# Patient Record
Sex: Female | Born: 1940 | Race: White | Hispanic: No | Smoking: Never smoker
Health system: Southern US, Community
[De-identification: ages and names within clinical notes are randomized; demographics above are authoritative.]

## PROBLEM LIST (undated history)

## (undated) DIAGNOSIS — H269 Unspecified cataract: Secondary | ICD-10-CM

## (undated) DIAGNOSIS — M2550 Pain in unspecified joint: Secondary | ICD-10-CM

## (undated) DIAGNOSIS — Z8709 Personal history of other diseases of the respiratory system: Secondary | ICD-10-CM

## (undated) DIAGNOSIS — E039 Hypothyroidism, unspecified: Secondary | ICD-10-CM

## (undated) DIAGNOSIS — M1990 Unspecified osteoarthritis, unspecified site: Secondary | ICD-10-CM

## (undated) DIAGNOSIS — E079 Disorder of thyroid, unspecified: Secondary | ICD-10-CM

## (undated) DIAGNOSIS — M2540 Effusion, unspecified joint: Secondary | ICD-10-CM

## (undated) DIAGNOSIS — R112 Nausea with vomiting, unspecified: Secondary | ICD-10-CM

## (undated) DIAGNOSIS — M5430 Sciatica, unspecified side: Secondary | ICD-10-CM

## (undated) DIAGNOSIS — E7800 Pure hypercholesterolemia, unspecified: Secondary | ICD-10-CM

## (undated) DIAGNOSIS — R238 Other skin changes: Secondary | ICD-10-CM

## (undated) DIAGNOSIS — R233 Spontaneous ecchymoses: Secondary | ICD-10-CM

## (undated) DIAGNOSIS — Z9889 Other specified postprocedural states: Secondary | ICD-10-CM

## (undated) HISTORY — DX: Disorder of thyroid, unspecified: E07.9

## (undated) HISTORY — PX: COLONOSCOPY: SHX174

## (undated) HISTORY — DX: Pure hypercholesterolemia, unspecified: E78.00

## (undated) HISTORY — PX: THYROIDECTOMY: SHX17

## (undated) HISTORY — PX: HAND SURGERY: SHX662

## (undated) HISTORY — PX: ABDOMINAL HYSTERECTOMY: SHX81

## (undated) HISTORY — PX: VESICOVAGINAL FISTULA CLOSURE W/ TAH: SUR271

---

## 2006-02-26 ENCOUNTER — Emergency Department (HOSPITAL_COMMUNITY): Admission: EM | Admit: 2006-02-26 | Discharge: 2006-02-26 | Payer: Self-pay | Admitting: Emergency Medicine

## 2006-08-23 ENCOUNTER — Ambulatory Visit (HOSPITAL_COMMUNITY): Admission: RE | Admit: 2006-08-23 | Discharge: 2006-08-23 | Payer: Self-pay | Admitting: Family Medicine

## 2006-09-03 ENCOUNTER — Ambulatory Visit: Payer: Self-pay | Admitting: Cardiology

## 2006-09-07 ENCOUNTER — Ambulatory Visit: Payer: Self-pay | Admitting: Cardiology

## 2006-09-07 ENCOUNTER — Encounter (HOSPITAL_COMMUNITY): Admission: RE | Admit: 2006-09-07 | Discharge: 2006-10-07 | Payer: Self-pay | Admitting: Cardiology

## 2006-09-10 ENCOUNTER — Encounter: Admission: RE | Admit: 2006-09-10 | Discharge: 2006-09-10 | Payer: Self-pay | Admitting: Family Medicine

## 2006-09-13 ENCOUNTER — Ambulatory Visit: Payer: Self-pay | Admitting: Cardiology

## 2006-09-28 ENCOUNTER — Ambulatory Visit (HOSPITAL_COMMUNITY): Admission: RE | Admit: 2006-09-28 | Discharge: 2006-09-28 | Payer: Self-pay | Admitting: General Surgery

## 2007-04-24 ENCOUNTER — Observation Stay (HOSPITAL_COMMUNITY): Admission: EM | Admit: 2007-04-24 | Discharge: 2007-04-26 | Payer: Self-pay | Admitting: Emergency Medicine

## 2007-04-25 ENCOUNTER — Ambulatory Visit: Payer: Self-pay | Admitting: Internal Medicine

## 2007-10-28 ENCOUNTER — Ambulatory Visit (HOSPITAL_COMMUNITY): Admission: RE | Admit: 2007-10-28 | Discharge: 2007-10-28 | Payer: Self-pay | Admitting: Family Medicine

## 2008-02-18 ENCOUNTER — Emergency Department (HOSPITAL_COMMUNITY): Admission: EM | Admit: 2008-02-18 | Discharge: 2008-02-18 | Payer: Self-pay | Admitting: Emergency Medicine

## 2010-03-03 ENCOUNTER — Emergency Department (HOSPITAL_COMMUNITY): Admission: EM | Admit: 2010-03-03 | Discharge: 2010-03-03 | Payer: Self-pay | Admitting: Emergency Medicine

## 2010-03-06 ENCOUNTER — Ambulatory Visit: Payer: Self-pay | Admitting: Orthopedic Surgery

## 2010-03-06 DIAGNOSIS — S82009A Unspecified fracture of unspecified patella, initial encounter for closed fracture: Secondary | ICD-10-CM

## 2010-03-06 HISTORY — DX: Unspecified fracture of unspecified patella, initial encounter for closed fracture: S82.009A

## 2010-03-11 ENCOUNTER — Encounter: Payer: Self-pay | Admitting: Orthopedic Surgery

## 2010-03-12 ENCOUNTER — Encounter: Payer: Self-pay | Admitting: Orthopedic Surgery

## 2010-03-13 ENCOUNTER — Encounter: Payer: Self-pay | Admitting: Orthopedic Surgery

## 2010-03-17 ENCOUNTER — Encounter: Payer: Self-pay | Admitting: Orthopedic Surgery

## 2010-03-17 ENCOUNTER — Telehealth: Payer: Self-pay | Admitting: Orthopedic Surgery

## 2010-03-27 ENCOUNTER — Ambulatory Visit: Payer: Self-pay | Admitting: Orthopedic Surgery

## 2010-03-27 ENCOUNTER — Encounter (INDEPENDENT_AMBULATORY_CARE_PROVIDER_SITE_OTHER): Payer: Self-pay | Admitting: *Deleted

## 2010-03-28 ENCOUNTER — Encounter: Payer: Self-pay | Admitting: Orthopedic Surgery

## 2010-04-17 ENCOUNTER — Encounter (INDEPENDENT_AMBULATORY_CARE_PROVIDER_SITE_OTHER): Payer: Self-pay | Admitting: *Deleted

## 2010-04-17 ENCOUNTER — Ambulatory Visit: Payer: Self-pay | Admitting: Orthopedic Surgery

## 2010-04-22 ENCOUNTER — Telehealth: Payer: Self-pay | Admitting: Orthopedic Surgery

## 2010-04-22 ENCOUNTER — Encounter: Payer: Self-pay | Admitting: Orthopedic Surgery

## 2010-05-06 ENCOUNTER — Encounter: Payer: Self-pay | Admitting: Orthopedic Surgery

## 2010-05-15 ENCOUNTER — Ambulatory Visit: Payer: Self-pay | Admitting: Orthopedic Surgery

## 2010-05-15 ENCOUNTER — Telehealth: Payer: Self-pay | Admitting: Orthopedic Surgery

## 2010-05-19 ENCOUNTER — Encounter: Payer: Self-pay | Admitting: Orthopedic Surgery

## 2010-05-20 ENCOUNTER — Encounter: Payer: Self-pay | Admitting: Orthopedic Surgery

## 2010-05-21 ENCOUNTER — Encounter: Payer: Self-pay | Admitting: Orthopedic Surgery

## 2010-05-28 ENCOUNTER — Encounter: Payer: Self-pay | Admitting: Orthopedic Surgery

## 2010-06-04 ENCOUNTER — Encounter: Payer: Self-pay | Admitting: Orthopedic Surgery

## 2010-06-12 ENCOUNTER — Encounter: Payer: Self-pay | Admitting: Orthopedic Surgery

## 2010-07-11 ENCOUNTER — Encounter: Payer: Self-pay | Admitting: Orthopedic Surgery

## 2010-07-15 ENCOUNTER — Ambulatory Visit
Admission: RE | Admit: 2010-07-15 | Discharge: 2010-07-15 | Payer: Self-pay | Source: Home / Self Care | Attending: Orthopedic Surgery | Admitting: Orthopedic Surgery

## 2010-07-15 ENCOUNTER — Encounter: Payer: Self-pay | Admitting: Orthopedic Surgery

## 2010-07-22 ENCOUNTER — Encounter: Payer: Self-pay | Admitting: Orthopedic Surgery

## 2010-07-23 ENCOUNTER — Encounter: Payer: Self-pay | Admitting: Orthopedic Surgery

## 2010-07-28 ENCOUNTER — Encounter: Payer: Self-pay | Admitting: Orthopedic Surgery

## 2010-07-29 NOTE — Progress Notes (Signed)
Summary: call from [facility] nurse case mgr,asked light duty?  Phone Note Other Incoming   Caller: nurse case Product manager of Call: Stann Ore, W.Comp nurse case manager assigned, insurer [employer].  [place] [phone] / Direct Fax [phone] (1) Relates that employer, [employer] has called, states they have light duty work available, complete sedentary job; asking if Dr Romeo Apple would want to release for light duty. (Dx Fx patella) (2) Asked for order for shower chair, as notes patient having difficulty in & out of tub for shower Initial call taken by: Cammie Sickle,  March 17, 2010 12:12 PM  Follow-up for Phone Call        1. no   2. shower chair - yes   get script from me Follow-up by: Fuller Canada MD,  March 17, 2010 12:29 PM  Additional Follow-up for Phone Call Additional follow up Details #1::        Advised nurse case manager as per above.   Rx faxed along w/copy of notes as per request. She will fwd the shower chair Rx to the Work comp vendor. Additional Follow-up by: Cammie Sickle,  March 17, 2010 3:41 PM

## 2010-07-29 NOTE — Progress Notes (Signed)
Summary: pt order to hand & rehab  Phone Note Other Incoming   Summary of Call: [facility] Noe at [facility] requested PT order for [facility]  Her work comp carrier has scheduled her for 04/23/10 there. Order was faxed 04/22/10 Initial call taken by: Jacklynn Ganong,  April 22, 2010 2:26 PM

## 2010-07-29 NOTE — Letter (Signed)
Summary: Out of Work  [facility]  [address]  [place], [place] [address]   Phone: [phone]  Fax: [phone]    March 27, 2010   Employee:  Donna Daniels [facility]    To Whom It May Concern:  Patient/Employee was seen in our office today for an appointment.    For Medical reasons, please excuse the above named employee from work for the following dates:  Start:   03/03/10              Continue out of work status 6 to 8 weeks  End/   Approximate:  04/28/10   Next scheduled appointment:  04/17/10 at 10:30am   If you need additional information, please feel free to contact our office.           Sincerely,    Terrance Mass, MD

## 2010-07-29 NOTE — Letter (Signed)
Summary: Out of Work  [facility]  [address]  [place], [place] [address]   Phone: [phone]  Fax: [phone]    March 06, 2010   Employee:  HANH KERTESZ [facility]    To Whom It May Concern:  Patient/Employee was seen in our office today for an appointment.    For Medical reasons, please excuse the above named employee from work for the following dates:  Start:   03/03/10              Continue out of work status 6 to 8 weeks  End/   Approximate:  04/28/10  If you need additional information, please feel free to contact our office.         Sincerely,    Terrance Mass, MD

## 2010-07-29 NOTE — Miscellaneous (Signed)
Summary: Updated PT order [facility] appr'd [employer]  Updated PT order [facility] appr'd [employer]   Imported By: Cammie Sickle 04/22/2010 18:50:19  _____________________________________________________________________  External Attachment:    Type:   Image     Comment:   External Document

## 2010-07-29 NOTE — Assessment & Plan Note (Signed)
Summary: 3 WK RE-CK/XRAYS RT KNEE/W.COMP,DOI 03/03/10/CAF   Visit Type:  Follow-up/workers comp Referring Provider:  self Primary Provider:  Dr. Sherwood Gambler  CC:  fu right knee.  History of Present Illness: I saw Donna Daniels in the office today for a 3 week  followup visit.  She is a 70 years old woman with the complaint of:  right knee.  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  Xrays [facility] 03/03/10 right knee.  Meds: Synthroid, Simvastatin, Calcium, Multivitamin, Norco 5, no refills needed.  Today is 3 week recheck with xrays after using  post op brace and crutches.  doing well with minimal pain at this time  She has about 80 of knee flexion  X-ray AP lateral RIGHT knee transverse fracture patellar nondisplaced healing well  Recommend allowing the brace to drop lock to 0-70 and locked in extension for weightbearing and start physical therapy active assisted range of motion and quadriceps strengthening advanced brace as tolerated     Allergies: No Known Drug Allergies   Impression & Recommendations:  Problem # 1:  CLOSED FRACTURE OF PATELLA (ICD-822.0)  Orders: Physical Therapy Referral (PT) Post-Op Check (10932) Knee x-ray, 1/2 views (35573) out of work 4 weeks  Patient Instructions: 1)  PT:  RT KNEE AAROM AND QUAD STRENGTHENING  2)   4 WEEKS RE CHECK    Orders Added: 1)  Physical Therapy Referral [PT] 2)  Post-Op Check [99024] 3)  Knee x-ray, 1/2 views [73560]

## 2010-07-29 NOTE — Letter (Signed)
Summary: Office notes,PT order fax W[employer] ins  Office notes,PT order fax [employer] ins   Imported By: Cammie Sickle 04/24/2010 09:13:09  _____________________________________________________________________  External Attachment:    Type:   Image     Comment:   External Document

## 2010-07-29 NOTE — Medication Information (Signed)
Summary: RX Folder  RX Folder   Imported By: Cammie Sickle 03/18/2010 11:33:20  _____________________________________________________________________  External Attachment:    Type:   Image     Comment:   External Document

## 2010-07-29 NOTE — Letter (Signed)
Summary: Medical records+order fax nurse case mgr  Medical records+order fax nurse case mgr   Imported By: Cammie Sickle 03/18/2010 12:40:47  _____________________________________________________________________  External Attachment:    Type:   Image     Comment:   External Document

## 2010-07-29 NOTE — Miscellaneous (Signed)
Summary: PT initial evaluation  PT initial evaluation   Imported By: Jacklynn Ganong 05/06/2010 14:17:37  _____________________________________________________________________  External Attachment:    Type:   Image     Comment:   External Document

## 2010-07-29 NOTE — Miscellaneous (Signed)
Summary: PT Progress report  PT Progress report   Imported By: Jacklynn Ganong 05/16/2010 09:48:59  _____________________________________________________________________  External Attachment:    Type:   Image     Comment:   External Document

## 2010-07-29 NOTE — Letter (Signed)
Summary: History form  History form   Imported By: Jacklynn Ganong 03/12/2010 16:57:25  _____________________________________________________________________  External Attachment:    Type:   Image     Comment:   External Document

## 2010-07-29 NOTE — Miscellaneous (Signed)
Summary: PT Progress notes  PT Progress notes   Imported By: Jacklynn Ganong 05/29/2010 08:46:21  _____________________________________________________________________  External Attachment:    Type:   Image     Comment:   External Document

## 2010-07-29 NOTE — Letter (Signed)
Summary: Out of Work  [facility]  [address]  [place], [place] [address]   Phone: [phone]  Fax: [phone]    April 17, 2010   Employee:  [name] [facility]    To Whom It May Concern:   For Medical reasons, please excuse the above named employee from work for the following dates:  Start:   03/03/10              Continue out of work status  End/ Approximate:     05/28/10  If you need additional information, please feel free to contact our office.         Sincerely,    Terrance Mass, MD

## 2010-07-29 NOTE — Letter (Signed)
Summary: Out of Work  [facility]  [address]  [place], [place] [address]   Phone: [phone]  Fax: [phone]    May 15, 2010   Employee:  MIRCA YALE [employer]    To Whom It May Concern:   For Medical reasons, please continue out of work status regarding the above named employee from work through the following date:  (End):  May 27, 2010  Cleared to return to work, sitting only:  May 28, 2010    If you need additional information, please feel free to contact our office.         Sincerely,    Terrance Mass, MD

## 2010-07-29 NOTE — Progress Notes (Signed)
Summary: call from case manager about today's appointment  Phone Note Other Incoming   Caller: Workers comp Stage manager of Call: Stann Ore RN, case manager for patient called to  (1) verify patient's appointment for this am (10:30) (2) call attention to the fax sent for Dr Romeo Apple to address at this visit       - "very sedentary" job description to review, if patient would be medically cleared and able to perform (3) reminder that this employer runs FMLA along with Workers Comp cases, and patient's FMLA runs out 05/27/10, at which time her regular/original job is no longer protected. Initial call taken by: Cammie Sickle,  May 15, 2010 8:50 AM

## 2010-07-29 NOTE — Letter (Signed)
Summary: Fax medical records + job req Work comp nurse case mgr  Fax medical records + job req Work comp nurse case mgr   Imported By: Cammie Sickle 05/19/2010 17:25:36  _____________________________________________________________________  External Attachment:    Type:   Image     Comment:   External Document

## 2010-07-29 NOTE — Miscellaneous (Signed)
Summary: PT progress notes  PT progress notes   Imported By: Jacklynn Ganong 06/06/2010 08:55:39  _____________________________________________________________________  External Attachment:    Type:   Image     Comment:   External Document

## 2010-07-29 NOTE — Miscellaneous (Signed)
Summary: PT progress note  PT progress note   Imported By: Jacklynn Ganong 05/21/2010 09:03:55  _____________________________________________________________________  External Attachment:    Type:   Image     Comment:   External Document

## 2010-07-29 NOTE — Miscellaneous (Signed)
Summary: PT progress note  PT progress note   Imported By: Jacklynn Ganong 05/26/2010 13:17:16  _____________________________________________________________________  External Attachment:    Type:   Image     Comment:   External Document

## 2010-07-29 NOTE — Letter (Signed)
Summary: Med records fax [place] [employer] Work comp   Med records fax [place] [employer] Work comp   Imported By: Cammie Sickle 03/28/2010 21:56:46  _____________________________________________________________________  External Attachment:    Type:   Image     Comment:   External Document

## 2010-07-29 NOTE — Letter (Signed)
Summary: [place]  History form   Imported By: Jacklynn Ganong 03/12/2010 16:52:55  _____________________________________________________________________  External Attachment:    Type:   Image     Comment:   External Document

## 2010-07-29 NOTE — Assessment & Plan Note (Signed)
Summary: 3 WK RE-CK/XRAYS RT KNEE/W.COMP,DOI 03/03/10/CAF   Visit Type:  Follow-up Referring Provider:  self Primary Provider:  Dr. Sherwood Gambler  CC:  right knee pain.  History of Present Illness: I saw Donna Daniels in the office today for a 3 week  followup visit.  She is a 70 years old woman with the complaint of:  righ tknee.  Xrays today.  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  Xrays [place] 03/03/10 right knee.  Meds: Synthroid, Simvastatin, Calcium, Multivitamin, Norco 5.  The patient reports that she is doing well.  She has mild discomfort across the front of the knee.  An x-ray was obtained today 2 views of the RIGHT knee AP and lateral there is a transverse fracture best seen on the lateral x-ray which is nondisplaced  Recommendation continue weightbearing in the brace with protection from the crutches/walker followup in 3 weeks for repeat x-ray if x-rays show no change in position of the fracture then we can start some rehabilitation  The patient is to continue work status   Allergies: No Known Drug Allergies   Impression & Recommendations:  Problem # 1:  CLOSED FRACTURE OF PATELLA (ICD-822.0) Assessment Improved  Orders: Post-Op Check ([address]) Patella Fx (60454)  Patient Instructions: 1)  CONTINUE BRACE X 3 WEEKS  2)  REPEAT XRAYS THEN

## 2010-07-29 NOTE — Letter (Signed)
Summary: Work note fax to Work [employer] ins  Work note fax to Work Comp [employer]   Imported By: Cammie Sickle 03/11/2010 17:45:27  _____________________________________________________________________  External Attachment:    Type:   Image     Comment:   External Document

## 2010-07-29 NOTE — Letter (Signed)
Summary: [place] [employer] W Comp job description  [place] [employer] W Comp job description   Imported By: Cammie Sickle 05/20/2010 14:37:33  _____________________________________________________________________  External Attachment:    Type:   Image     Comment:   External Document

## 2010-07-29 NOTE — Letter (Signed)
Summary: Medical record request [facility]  Medical record request [employer] W[employer]Comp   Imported By: Cammie Sickle 03/17/2010 18:03:30  _____________________________________________________________________  External Attachment:    Type:   Image     Comment:   External Document

## 2010-07-29 NOTE — Assessment & Plan Note (Signed)
Summary: 4 WK RE-CK RT KNEE/W.COMP/DOI/03/03/10/CAF   Visit Type:  Follow-up Referring Provider:  self Primary Provider:  Dr. Sherwood Gambler  CC:  recheck knee.  History of Present Illness: I saw Donna Daniels in the office today for a 3 week  followup visit.  She is a 70 years old woman with the complaint of:  right knee.  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  Meds: Synthroid, Simvastatin, Calcium, Multivitamin, Norco 5 takes as needed.  Today is 4 week recheck after PT, report for review to be signed from 05/14/10, [facility].  Wants to know if she can drive.  PT is doing well, still some weakness.    Allergies: No Known Drug Allergies  Physical Exam  Additional Exam:  RIGHT knee flexion is 120, full extension. Has good extension. She can move her leg well. No tenderness.  No deformity. Neurovascular exam is intact. Skin is intact; knee stable   Impression & Recommendations:  Problem # 1:  CLOSED FRACTURE OF PATELLA (ICD-822.0) Assessment Improved  I reviewed her work description and the job descriptions available.  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 Return to regular job duties  Orders: Post-Op Check [phone])  Patient Instructions: 1)  Wear new brace x 4 weeks 2)  Therapy x 2 weeks  3)  return to work Nov 30th  4)  8 weeks   Orders Added: 1)  Post-Op Check [36144]

## 2010-07-29 NOTE — Assessment & Plan Note (Signed)
Summary: 1:30PM/EVAL/TREAT FX RT KNEECAP/XRAY [place]/W.COMP DOI 9/5...   Vital Signs:  Patient profile:   70 year old female Height:      67 inches Weight:      151 pounds Pulse rate:   76 / minute Resp:     16 per minute  Vitals Entered By: Fuller Canada MD (March 06, 2010 1:29 PM)  Visit Type:  new patient Referring Provider:  self Primary Provider:  Dr. Sherwood Gambler  CC:  right knee pain.  History of Present Illness: I saw Donna Daniels in the office today for an initial evaluation.  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  Xrays [place] 03/03/10 right knee.  Meds: Synthroid, Simvastatin, Calcium, Multivitamin, Norco 5 from er.  ER treated with knee immobilizer.  She was injured on September 5.  She fell at work while pulling on a table which is part of her daily duties.  She landed on her RIGHT knee complains of sharp throbbing burning pain, 8/10 severity, pain is better when she is not moving but any attempts at weight bearing pain increases  She has some numbness and tingling some swelling and bruising.  She is in a brace which she got from the ER she is on crutches she is laboring to walk.  She is also on some Advil at a little bit of a fever which goes away with Tylenol.    Allergies (verified): No Known Drug Allergies  Past History:  Past Medical History: htn high cholesterol thyroid  Past Surgical History: c section hysterectomy thyroidectomy hand surgery  Family History: Family History Coronary Heart Disease female < 38 Family History of Arthritis Hx, family, asthma  Social History: Patient is widowed.  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 Cardiovascular:  Denies chest pain, palpitations, fainting, and murmurs. Respiratory:  Complains of snoring; denies short of breath, wheezing, couch, tightness, pain on inspiration, and snoring . Gastrointestinal:  Complains of nausea; denies heartburn, vomiting, diarrhea, constipation, and blood in your stools. Genitourinary:  Denies frequency, urgency, difficulty urinating, painful urination, flank pain, and bleeding in urine. Neurologic:  Denies numbness, tingling, unsteady gait, dizziness, tremors, and seizure. Musculoskeletal:  Complains of joint pain, swelling, instability, stiffness, and heat; denies redness and muscle pain. Endocrine:  Denies excessive thirst, exessive urination, and heat or cold intolerance. Psychiatric:  Denies nervousness, depression, anxiety, and hallucinations. Skin:  Complains of itching; denies changes in the skin, poor healing, rash, and redness. HEENT:  Denies blurred or double vision, eye pain, redness, and watering. Immunology:  Denies seasonal allergies, sinus problems, and allergic to bee stings. Hemoatologic:  Complains of brusing; denies easy bleeding.  Physical Exam  Additional Exam:  GEN: well developed, well nourished, normal grooming and hygiene, no deformity and normal body habitus.   CDV: lower extremities/pulses are normal, no edema, no erythema. no tenderness  Lymph: normal lymph nodes   Skin: lower extremities/no rashes, skin lesions or open sores   NEURO:lower extremities/ normal coordination, reflexes, sensation.   Psyche: awake, alert and oriented. Mood normal   Gait: ambulating with crutches  RIGHT knee:Tenderness at  the inferior pole the patella.  Range of motion limited 20.  Straight leg raise intact.  Knee stable.  LEFT knee: Tenderness in the patella but no swelling, normal range of motion, normal strength, no  instability.    Impression & Recommendations:  Problem # 1:  CLOSED FRACTURE OF PATELLA (ICD-822.0) Assessment New  x-rays from the hospital multiple views of the RIGHT knee show a nondisplaced patella fracture  Impression nondisplaced patella fracture  The patient can perform straight leg raise without extensor lag so no surgery should be needed as long as the fracture does not displace  Recommend straight leg brace weightbearing as tolerated for 3 weeks then range of motion brace advance range of motion as tolerated probably take 6-8 weeks for healing and recovery period  Orders: New Patient Level III ([address])  Medications Added to Medication List This Visit: 1)  Norco 5-325 Mg Tabs (Hydrocodone-acetaminophen) .Marland Kitchen.. 1 q 4 as needed pain  Patient Instructions: 1)  brace in extension 3 weeks  2)  then 3 weeks ROM  3)  OOW 6-8 WEEKS  4)  F/U IN 3 WEEKS XRAYS [ADV ROM] Prescriptions: NORCO 5-325 MG TABS (HYDROCODONE-ACETAMINOPHEN) 1 Q 4 as needed PAIN  #40 x 1   Entered and Authorized by:   Fuller Canada MD   Signed by:   Fuller Canada MD on 03/06/2010   Method used:   Print then Give to Patient   RxID:   [id number]

## 2010-07-31 ENCOUNTER — Encounter: Payer: Self-pay | Admitting: Orthopedic Surgery

## 2010-07-31 ENCOUNTER — Telehealth: Payer: Self-pay | Admitting: Orthopedic Surgery

## 2010-07-31 NOTE — Letter (Signed)
Summary: Medical record request [facility]  Medical record request [facility]   Imported By: Cammie Sickle 07/11/2010 19:31:00  _____________________________________________________________________  External Attachment:    Type:   Image     Comment:   External Document

## 2010-07-31 NOTE — Assessment & Plan Note (Signed)
Summary: 8 WK RE-CK RT KNEE/W.COMP-RELATED INJURY 03/03/10/CAF   Visit Type:  Follow-up Referring Provider:  self Primary Provider:  Dr. Sherwood Gambler  CC:  right knee pain.  History of Present Illness: I saw Donna Daniels in the office today for a 8 week  followup visit.  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  Treated with immobilization and therapy   Meds: Synthroid, Simvastatin, Calcium, Multivitamin, Norco 5 takes as needed, Advil 400mg  as needed.  Complaints: She has been having shooting and throbbing pain for the past week. ? if the weather has anything to do with it.  She was terminated from her job so she's not doing any work right now  Complaints of pain and swelling RIGHT knee primarily around the joint     Allergies: No Known Drug Allergies  Physical Exam  Skin:  intact without lesions or rashes Psych:  alert and cooperative; normal mood and affect; normal attention span and concentration   Knee Exam  General:    Well-developed, well-nourished, normal body habitus; no deformities, normal grooming.  Gait:    Normal heel-toe gait pattern bilaterally.    Skin:    Intact, no scars, lesions, rashes, cafe au lait spots, or bruising.    Inspection:    RIGHT knee joint mild effusionswelling:   Palpation:    she's tender over the medial joint line as well as the distal pole of the patella and also over the has anserine bursa  Vascular:    There was no swelling or varicose veins. The pulses and temperature are normal. There was no edema or tenderness.  Sensory:    Gross coordination and sensation were normal.    Motor:    Motor strength 5/5 bilaterally for quadriceps, hamstrings, ankle dorsiflexion, and ankle plantar flexion.    Knee Exam:    Right:  Inspection:  Abnormal    Palpation:  Abnormal    Stability:  stable    range of motion is flexion 125 with full extension   Impression & Recommendations:  Problem # 1:  CLOSED FRACTURE OF PATELLA (ICD-822.0) Assessment Comment Only  there is an effusion there is some peripatellar tenderness and medial joint line tenderness although the knee remained stable.  Whether related pain may be the cause.  I see no structural damage to the knee  Recommend increased Advil 3 times a day.  Wear brace as tolerated  I expect a part permanent partial impairment of approximately 7-1/2% based on the continued pain and the fracture.  Orders: Est. Patient Level III (81191)  Patient Instructions: 1)  Take 400mg  advil three times a day and pain med as needed. 2)  Return in march 3)  Brace off wear as tolerated  4)  expect PPI  Prescriptions: NORCO 5-325 MG TABS (HYDROCODONE-ACETAMINOPHEN) 1 Q 4 as needed PAIN  #60 x 0   Entered and Authorized by:   Fuller Canada MD   Signed by:   Fuller Canada MD on 07/15/2010   Method used:   Print then Give to Patient   RxID:   [id number]    Orders Added: 1)  Est. Patient Level III [46962]

## 2010-07-31 NOTE — Miscellaneous (Signed)
Summary: Rehab discharge summary  Rehab discharge summary   Imported By: Jacklynn Ganong 06/16/2010 14:20:21  _____________________________________________________________________  External Attachment:    Type:   Image     Comment:   External Document

## 2010-07-31 NOTE — Letter (Signed)
Summary: Work [facility]  [address]  [place], [place] [address]   Phone: [phone]  Fax: [phone]    Today's Date: July 22, 2010  Name of Patient: Donna Daniels  The above named patient had a medical visit on  07/15/10.  Please take this into consideration when reviewing the time away from work  Special Instructions:  Please continue same work status:  (End Date):  May 27, 2010  Cleared to return to work, sitting only   May 28, 2010 ____________________________________________   Sincerely yours,   Terrance Mass, MD

## 2010-07-31 NOTE — Letter (Signed)
Summary: Medical records fax to Workers Comp case mgr  Medical records fax to Workers Comp case mgr   Imported By: Cammie Sickle 07/15/2010 18:26:49  _____________________________________________________________________  External Attachment:    Type:   Image     Comment:   External Document

## 2010-07-31 NOTE — Letter (Signed)
Summary: Work note fax to Workers Comp case mgr  Work note fax to Workers Comp case mgr   Imported By: Cammie Sickle 07/25/2010 18:01:08  _____________________________________________________________________  External Attachment:    Type:   Image     Comment:   External Document

## 2010-08-06 NOTE — Letter (Signed)
Summary: medical record fax [facility] off notes 07/15/10  medical record fax [facility] off notes 07/15/10   Imported By: Cammie Sickle 08/01/2010 09:39:17  _____________________________________________________________________  External Attachment:    Type:   Image     Comment:   External Document

## 2010-08-06 NOTE — Progress Notes (Signed)
Summary: call from [facility] per request  Phone Note Call from Patient   Summary of Call: Silvana Newness, paralegal from [facility] called, ph [phone], fax [phone], states that patient relayed that Dr Romeo Apple needed to speak with their office.  Please call at above # if so. Initial call taken by: Cammie Sickle,  July 31, 2010 12:34 PM

## 2010-08-14 ENCOUNTER — Encounter: Payer: Self-pay | Admitting: Orthopedic Surgery

## 2010-08-20 NOTE — Letter (Signed)
Summary: medical records  2nd request [facility]  medical records  2nd request [facility]   Imported By: Cammie Sickle 08/13/2010 20:18:52  _____________________________________________________________________  External Attachment:    Type:   Image     Comment:   External Document

## 2010-08-20 NOTE — Miscellaneous (Signed)
Summary: DR H , call to [employer]  asked if they needed any more info from our office?  just progress notes   spoke to [facility]

## 2010-09-09 ENCOUNTER — Encounter: Payer: Self-pay | Admitting: Orthopedic Surgery

## 2010-09-09 ENCOUNTER — Ambulatory Visit (INDEPENDENT_AMBULATORY_CARE_PROVIDER_SITE_OTHER): Payer: Worker's Compensation | Admitting: Orthopedic Surgery

## 2010-09-09 DIAGNOSIS — S82009A Unspecified fracture of unspecified patella, initial encounter for closed fracture: Secondary | ICD-10-CM

## 2010-09-10 ENCOUNTER — Encounter: Payer: Self-pay | Admitting: Orthopedic Surgery

## 2010-09-15 ENCOUNTER — Encounter: Payer: Self-pay | Admitting: Orthopedic Surgery

## 2010-09-16 NOTE — Assessment & Plan Note (Signed)
Summary: reck rt knee/doi 03/03/10/w comp/caf   Visit Type:  Follow-up workers comp Referring Provider:  self Primary Provider:  Dr. Sherwood Gambler  CC:  right knee recheck.  History of Present Illness: I saw Donna Daniels in the office today for a 8 week  followup visit.  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  treatment immobilization and therapy  Meds: Synthroid, Simvastatin, Calcium, Multivitamin, Norco 5 takes as needed, Advil 400mg  as needed.  Today is 2 month recheck after wearing brace as needed.  She says that she is doing much better she has pain level I/X.  She uses her brace intermittently.  She takes Advil when needed.  She wishes she could return to work.       Allergies: No Known Drug Allergies  Review of Systems Musculoskeletal:  denies catching locking or giving way..  Physical Exam  Additional Exam:   Her examination reveals a normal gait pattern.  She has 132 of knee flexion with full extension.  She has a straight leg raise which is intact with no extensor lag.  Her knee is stable.  There is no swelling or tenderness.  She has normal sensation and normal pulses in the RIGHT lower extremity.   Impression & Recommendations:  Problem # 1:  CLOSED FRACTURE OF PATELLA (ICD-822.0) Assessment Improved  Orders: Est. Patient Level III (30865)  Patient Instructions: 1)  Brace as needed  2)  f/u as needed    Orders Added: 1)  Est. Patient Level III [78469]

## 2010-09-25 NOTE — Letter (Signed)
Summary: Medical records request office notes [facility]  Medical records request office notes [facility]   Imported By: Cammie Sickle 09/14/2010 11:40:36  _____________________________________________________________________  External Attachment:    Type:   Image     Comment:   External Document

## 2010-09-25 NOTE — Letter (Signed)
Summary: Medical office notes fax Workers Chi[employer] office notes fax Workers Comp insurer   Imported By: Cammie Sickle 09/15/2010 20:16:57  _____________________________________________________________________  External Attachment:    Type:   Image     Comment:   External Document

## 2010-09-29 ENCOUNTER — Other Ambulatory Visit: Payer: Self-pay | Admitting: Family Medicine

## 2010-09-29 DIAGNOSIS — Z1231 Encounter for screening mammogram for malignant neoplasm of breast: Secondary | ICD-10-CM

## 2010-10-16 ENCOUNTER — Encounter: Payer: Self-pay | Admitting: Orthopedic Surgery

## 2010-10-22 ENCOUNTER — Ambulatory Visit
Admission: RE | Admit: 2010-10-22 | Discharge: 2010-10-22 | Disposition: A | Discharge status: Home / Self Care | Payer: Medicare Other | Source: Ambulatory Visit | Attending: Family Medicine | Admitting: Family Medicine

## 2010-10-22 DIAGNOSIS — Z1231 Encounter for screening mammogram for malignant neoplasm of breast: Secondary | ICD-10-CM

## 2010-11-11 NOTE — Consult Note (Signed)
Donna Daniels, [name]              ACCOUNT NO.:  [id number]   MEDICAL RECORD NO.:  [id number]          PATIENT TYPE:  OBV   LOCATION:  A207                          FACILITY:  [facility]   PHYSICIAN:  Kofi A. Gerilyn Pilgrim, M.D. DATE OF BIRTH:  Jun 02, 1941   DATE OF CONSULTATION:  DATE OF DISCHARGE:                                 CONSULTATION   This is a 70 year old white female who presents with an ill-defined  event.  She reports feeling faint and light-headed as if she was going  to pass out.  She says this happened at work.  She apparently works at  [employer].  She denies any spinning sensation.  She reports having blurry  vision in both visual fields involving both eyes, also circle-like  visual obscurations.  She denies any flashing lights.  The patient  currently has some feelings of nausea, but no emesis.  She went to the  car to relax.  She apparently was found sleeping in the car by her  daughter.  She denies any loss of consciousness or alteration of  consciousness.  There is focal numbness or weakness, no dysarthria or  dysphasia.  She has some headache in the posterior aspect.  The patient  prior did have some gait instability.   The medical history is significant for dyslipidemia and hypothyroidism.   ADMISSION MEDICATIONS:  Synthroid and Zocor.   ALLERGIES:  NONE KNOWN.   Family history is significant for a father who died of a brain aneurysm  in his 81s, but also history of stroke and heart disease.  The patient  also had other relatives with aneurysms of the brain and abdominal  aorta.   SOCIAL HISTORY:  She lives in [place].  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  There is no history of tobacco use, no history of  alcohol or illicit drug use.  She lives independently.   REVIEW OF SYSTEMS:  Unremarkable, other than stated in the history of  present illness   PHYSICAL EXAMINATION:  GENERAL:  Physical examination shows a thin,  pleasant lady in no acute  distress.  VITAL SIGNS:  Temperature 97, pulse 65, respirations 16, blood pressure  106/65.  HEENT EVALUATION:  Neck is supple. Head is normocephalic, atraumatic.  ABDOMEN:  Soft.  EXTREMITIES:  No edema.  MENTATION:  She is awake and alert.  She converses well.  Speech,  language and cognition are intact.  CRANIAL NERVES EVALUATION:  Pupils are 4 mm and reactive to light.  Extraocular movements are intact in the mesiolabial folds.  The  patient's muscle strength is symmetric.  The tongue is midline.  The  uvula is midline.  Shoulder shrug is normal.  MOTOR EXAMINATION:  Normal tone, bulk and strength.  I see no pronator  drift.  COORDINATION:  Shows no tremors, dysmetria or Parkinsonism.  REFLEXES:  Normal.  Tack reflexes are both equivocal.  SENSATION:  Normal to temperature and light tough.   Her CT scan of the brain is normal.   LABORATORY EVALUATION:  CPK is 71.  Repeat 109.  Urinalysis negative.  ESR of 12.  Hepatic function normal.  Sodium 139, potassium 3.5,  chloride 102, CO2 32, glucose 109, BUN 18, creatinine 0.7.  WBC 8,  hemoglobin 12.3, platelet count of 282.   ASSESSMENT:  Syncope/dizziness.  The semiology is nondescript and ill-  defined but certainly a chance that ischemic attack could be a  possibility.  Other possibilities include atypical seizure.   RECOMMENDATIONS:  Agree with imaging with MRI and MRA.  She is on  aspirin, which is appropriate, and I am going to add an EEG.  Further  recommendations will follow depending on the initial evaluation.   Thanks for this consultation.      Kofi A. Gerilyn Pilgrim, M.D.  Electronically Signed     KAD/MEDQ  D:  04/25/2007  T:  04/25/2007  Job:  [id number]

## 2010-11-11 NOTE — H&P (Signed)
Donna Daniels, Donna Daniels              ACCOUNT NO.:  [id number]   MEDICAL RECORD NO.:  [id number]          PATIENT TYPE:  OBV   LOCATION:  A207                          FACILITY:  [facility]   PHYSICIAN:  Osvaldo Shipper, MD     DATE OF BIRTH:  05/26/1941   DATE OF ADMISSION:  04/24/2007  DATE OF DISCHARGE:  LH                              HISTORY & PHYSICAL   PRIMARY CARE PHYSICIAN:  Patrica Duel, M.D.   ADMISSION DIAGNOSES:  1. Possible transient ischemic attack.  2. History of hypothyroidism.  3. History of dyslipidemia.   CHIEF COMPLAINT:  Blurred vision and left-sided weakness since this  afternoon.   HISTORY OF PRESENT ILLNESS:  This patient is a 70 year old Caucasian  female who has a history of dyslipidemia and hypothyroidism who was  working at [employer] when she had blurred vision.  She started noticing  circles in her vision.  She closed each of her eyes and the deficits did  not go away.  She walked to her car which was parked outside the [place]  and felt lightheaded and then she was brought into the ED.  She did not  have any syncopal episode as per the patient, but she was found sleeping  in her car.  She also started getting headaches in the back of her head  after these episodes started.  She felt nauseous, but no emesis.  No  chest pain.  She is complaining of left-sided flank pain.  She mentions  that on Friday she had pain in the left side of her neck which has since  resolved. She is having some instability with her gait as well.   MEDICATIONS:  1. Synthroid 100 mcg once a day.  2. Zocor 20 mg once a day.   ALLERGIES:  No known drug allergies.   PAST MEDICAL HISTORY:  Positive for dyslipidemia, hypothyroidism.  She  had a stress test back in March of this year which was negative for  ischemia.  She had a history of abnormal EKG.  She had a history of  thyroidectomy for goiter, history of cesarean section, history of  hysterectomy, and hand surgery for crush  injury.   SOCIAL HISTORY:  She lives in [place] and lives alone.  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  No smoking, alcohol, or illicit drug use.  She is  independent with daily activities.   FAMILY HISTORY:  Father died of brain aneurysm in his 64s.  Mother had  history of stroke and some heart disease.  One of her uncles died of  brain aneurysm as well.  One of her aunts had abdominal aneurysm.  History of coronary artery disease also present in the family.   REVIEW OF SYSTEMS:  GENERAL:  Positive for weakness.  HEENT:  Unremarkable.  Blurred vision resolved.  CARDIOVASCULAR:  Unremarkable.  RESPIRATORY:  Unremarkable.  GASTROINTESTINAL:  Unremarkable.  GENITOURINARY:  Unremarkable.  MUSCULOSKELETAL:  Positive for arthritis.  NEUROLOGY:  As in HPI.   PHYSICAL EXAMINATION:  VITAL SIGNS:  Temperature 98.5, blood pressure  124/74, heart rate 74, respiratory rate 18, saturations 99% on room air.  GENERAL:  Thin white female in no distress.  HEENT:  There is no pallor and no icterus.  Oral mucous membrane is  moist.  No oral lesions are noted.  NECK:  Soft and supple.  No thyromegaly is appreciated.  LUNGS:  Clear to auscultation bilaterally.  CARDIOVASCULAR:  S1 and S2 is normal and regular.  No S3 and no S4.  No  carotid bruits are heard.  ABDOMEN:  No abdominal bruits are heard.  Abdomen is soft, nontender,  and nondistended.  No flank tenderness is present.  No mass or  organomegaly is appreciated.  EXTREMITIES:  Without edema.  Peripheral pulses are palpable.  NEUROLOGY:  Cranial nerves II-XII grossly intact.  Sensory examination  within normal limits.  Motor examination reveals mild deficits on the  left side predominantly in the left lower extremity.  Strength is  probably between 4-5 out of 5.  Right side is completely normal.  She  did not have any pronator drift.  The cerebellar signs probably a little   weaker on the left side.  Her gait was unsteady.  Her Romberg sign was  negative.  No nystagmus was noted.   EKG shows sinus rhythm with a normal axis.  Intervals appear to be in  normal range.  No definite Q waves appreciated.  A lot of nonspecific T  wave changes with flattening and mild inversion noted.  No other  concerning ST changes are noted.   CT of head was negative for any acute process.   LABORATORY DATA:  CBC was unremarkable.  BMET was unremarkable.   Chest x-ray has not been done.   ASSESSMENT:  This is a 70 year old Caucasian female with a history of  dyslipidemia, hypothyroidism, who presents with sudden onset blurred  vision and unsteadiness of gait.  CT of head is negative.  The patient  has probably had either transient ischemic attack or stroke involving  the posterior circulation most likely.  She could possibly have migraine  headaches as well.  She could also have aneurysms in her brain.   PLAN:  1. Possible transient ischemic attack versus stroke.  We will observe      the patient in the hospital and get a MRI of her brain done      tomorrow.  We will also get an MRA done considering history of      aneurysms in her family.  We will put her on aspirin.  Her blood      pressure is very well controlled and continue the Zocor.  We will      do the stroke workup in the form of an echo and carotid Doppler.      We will also check a chest x-ray.  We will do homocysteine level,      ESR, and RPR.  We will also check her LFT's and coags.  2. Continue her Synthroid and Zocor for hypothyroidism and      dyslipidemia, respectively.  Deep venous thrombosis and      gastrointestinal prophylaxis will be initiated.  3. PT/OT consults will be done.  At this time she does not need a      swallow evaluation.  There is good gag reflex.  4. She is a full code.   The above is explained to the patient's daughter with the patient's  permission and she is agreeable.  A  neurology  consultation will also be  ordered.      Osvaldo Shipper, MD  Electronically Signed     GK/MEDQ  D:  04/24/2007  T:  04/24/2007  Job:  [id number]   cc:   Patrica Duel, M.D.  Fax: [phone]   Kofi A. Gerilyn Pilgrim, M.D.  Fax: [phone]

## 2010-11-11 NOTE — Discharge Summary (Signed)
Donna Daniels, Donna Daniels              ACCOUNT NO.:  [id number]   MEDICAL RECORD NO.:  [id number]          PATIENT TYPE:  OBV   LOCATION:  A207                          FACILITY:  [facility]   PHYSICIAN:  Marcello Moores, MD   DATE OF BIRTH:  [birth year]   DATE OF ADMISSION:  04/24/2007  DATE OF DISCHARGE:  LH                               DISCHARGE SUMMARY   PMD:  Dr. Patrica Duel.   DISCHARGE DIAGNOSES:  1. Syncope/transient ischemic attack resolved.  2. History of hypothyroidism stable.  3. Dyslipidemia stable.   HOME MEDICATIONS:  She will be discharged with her home medications  Zocor 20 mg p.o. daily and Synthroid 100 mcg p.o. daily.   HOSPITAL COURSE:  Ms. Ciolino is a 70 year old female patient with the  above medical problems.  She presented to the emergency room with sudden  onset of blurred vision while she was working at [employer], and she  presented to emergency room.  This episode was not repeated, and she was  admitted for observation.  She has not had any similar episode while she  is the hospital.  CAT scan of the brain was done during the admission  which showed no significant abnormality and MRA as well as an MRI was  done the next day and showed no significant abnormality without any sign  of stenosis or occlusion.  She was evaluated by neurologist Dr. Gerilyn Pilgrim  as well.  Carotid Doppler was also done which showed no evidence of  significant leg stenosis with normal intercarotid arteries.  The patient  remains remained stable, and she is ready to go home today.   PHYSICAL EXAMINATION:  VITAL SIGNS:  Today temperature 97, pulse 63,  respiratory rate 20, blood pressure 113/69, and she is saturating 97% on  room air.  HEENT: She has pink conjunctivae, nonicteric sclera.  NECK:  She has supple neck.  CHEST:  Good air entry bilaterally.  CVS:  S1, S2 well heard.  No murmur appreciated.  ABDOMEN:  Soft.  EXTREMITIES:  No edema.  CNS:  She is alert and well-oriented.   LABORATORY DATA:  CBC as well as chemistries were within normal range.  Cardiac enzymes three times also were within normal range.   PLAN OF DISCHARGE:  She will be discharged today to have follow-up with  her PMD in the time of one week.      Marcello Moores, MD  Electronically Signed     MT/MEDQ  D:  04/26/2007  T:  04/26/2007  Job:  [id number]   cc:   Patrica Duel, M.D.  Fax: [phone]

## 2010-11-11 NOTE — Procedures (Signed)
NAMESHAWNDRA, CLUTE NO.:  [id number]   MEDICAL RECORD NO.:  [id number]          PATIENT TYPE:  OBV   LOCATION:  A207                          FACILITY:  [facility]   PHYSICIAN:  Pricilla Riffle, MD, FACCDATE OF BIRTH:  05/01/1941   DATE OF PROCEDURE:  04/25/2007  DATE OF DISCHARGE:                                ECHOCARDIOGRAM   INDICATIONS:  A 70 year old, history of TIA, test to evaluation LV  function.   TWO-D ECHOCARDIOGRAM WITH ECHO-DOPPLER:  Left ventricle is normal in  size with an end-diastolic dimension of 45 mm.  The inner ventricular  septum and posterior wall are normal at 11 mm each.  Left atrium is  normal at 37 mm.  Right atrium appears mildly dilated.  Right ventricle  appears to be mildly dilated.   The aortic valve is mildly thickened, not stenotic, there is no  insufficiency.  Mitral valve is grossly normal with mild insufficiency  (1 out of 4).  Pulmonic valve is normal with no insufficiency.  Tricuspid valve is normal with mild insufficiency (1 out of 4).  Estimate PA pressure by TR jet is 38 mmHg.   Overall left ventricular systolic function is normal with an LVF of  approximately 55-60%.  Note there is some paradoxical septal motion at  the septal base.   RV is again mildly dilated but overall RV function appears normal.   IVC is normal.  No pericardial effusion is seen.      Pricilla Riffle, MD, [facility]  Electronically Signed     PVR/MEDQ  D:  04/25/2007  T:  04/26/2007  Job:  [id number]   cc:   Thora Lance  Fax: [phone]

## 2010-11-14 NOTE — H&P (Signed)
NAMERAYCHELL, HOLCOMB              ACCOUNT NO.:  [id number]   MEDICAL RECORD NO.:  [id number]          PATIENT TYPE:  AMB   LOCATION:  DAY                           FACILITY:  [facility]   PHYSICIAN:  Dalia Heading, M.D.  DATE OF BIRTH:  1940-10-29   DATE OF ADMISSION:  DATE OF DISCHARGE:  LH                              HISTORY & PHYSICAL   CHIEF COMPLAINT:  Need for screening colonoscopy.   HISTORY OF PRESENT ILLNESS:  The patient is a 70 year old white female  who is referred for endoscopic evaluation.  She needs a colonoscopy for  screening purposes.  No abdominal pain, weight loss, nausea, vomiting,  diarrhea, constipation, melena, hematochezia have been noted.  She has  never had a colonoscopy.  There is no family history of colon carcinoma.   PAST MEDICAL HISTORY:  Hypothyroidism.   PAST SURGICAL HISTORY:  1. C-section.  2. Thyroidectomy.  3. Hand surgery.  4. Hysterectomy.   CURRENT MEDICATIONS:  Synthroid.   ALLERGIES:  NO KNOWN DRUG ALLERGIES.   REVIEW OF SYSTEMS:  Noncontributory.   PHYSICAL EXAMINATION:  GENERAL:  The patient is a well-developed, well-  nourished white female in no acute distress.  LUNGS:  Clear to auscultation with equal breath sounds bilaterally.  HEART:  Reveals a regular rate and rhythm without S3, S4, or murmurs.  ABDOMEN:  Soft, nontender, nondistended.  No hepatosplenomegaly or  masses are noted.  RECTAL:  Deferred to the procedure.   IMPRESSION:  Need for screening colonoscopy.   PLAN:  The patient is scheduled for colonoscopy on September 28, 2006.  The  risks and benefits of the procedure including bleeding and perforation  were fully explained to the patient, gave informed consent.      Dalia Heading, M.D.  Electronically Signed     MAJ/MEDQ  D:  09/09/2006  T:  09/10/2006  Job:  [id number]   cc:   [facility]  Fax: [phone]   Kirk Ruths, M.D.  Fax: [phone]

## 2010-11-14 NOTE — Letter (Signed)
September 03, 2006    Kirk Ruths, M.D.  [address]  [place], [place] [address]   RE:  Donna Daniels, Daniels  MRN:  [id number]  /  DOB:  07/24/1940   Dear Annette Stable:   It is my pleasure evaluating Donna Daniels Daniels in the office today in  consultation at your request.  As you know, Donna Daniels Daniels has enjoyed  generally excellent health.  She has not had hypertension nor diabetes.  She does not use tobacco products.  She has had mild to moderate  hyperlipidemia that has not required pharmacologic therapy.  She was  evaluated by our group approximately 10 years ago for chest pain and  underwent coronary angiography which was reportedly negative.  Those  records have been requested.  She was seen in your office for her annual  assessment and reported episodes of chest discomfort.  These are  described as emanating from the upper left back and radiating to the  upper-mid chest.  She believes that these are musculoskeletal in  etiology.  She can walk up to 4 miles at work in a [employer] without  symptoms.  When she does have chest discomfort, there is no associated  dyspnea, diaphoresis, nor nausea.  Relief is spontaneous over a period  of a number of minutes.   PAST MEDICAL HISTORY:  Otherwise benign except for a thyroidectomy in  May 1978.  She is maintained on Levothyroxine with annual assessments of  the adequacy of replacement.  She has rare leg cramps for which she  takes Quinidine approximately once or twice a year.  She has used none  of Donna Daniels more recently.   ALLERGIES:  THE PATIENT HAS NO KNOWN ALLERGIES.   MEDICATIONS:  Levothyroxine 0.1 mg daily.   SOCIAL HISTORY:  Works for [employer].  Typically walks 4 miles  per day.  Widowed with 1 adult child.   FAMILY HISTORY:  Father died due to an aneurysm; mother died secondary  due to leukemia.  She has 3 siblings who are alive and well.   REVIEW OF SYSTEMS:  Notable for headaches that she attributes to sinus  problems, the  need for corrective lenses, upper dentures, rare episode  of exertional dyspnea.  Mild, intermittent morning nausea that resolves  with eating.  Her gynecologic care is up to date.  She has some  arthritic discomfort in her hands and knees.  She gets mild pedal edema  with prolonged standing.  All other systems reviewed and are negative.   EXAMINATION:  [name] in no acute distress.  The weight is 148,  blood pressure 115/65, heart rate 75 and regular, respirations 16.  NECK:  No jugular venous distention; normal carotid upstrokes without  bruits.  HEENT:  Normal funduscopic examination; EOMs full.  ENDOCRINE:  Transverse surgical scar at the base of the neck; no  residual thyroid tissue palpated.  LUNGS:  Clear.  CARDIAC:  Faint fourth heart sound and soft systolic murmur.  ABDOMEN:  Soft and nontender; no masses; no organomegaly.  EXTREMITIES:  No edema; normal distal pulses.  NEUROMUSCULAR:  Symmetric strength and tone; normal cranial nerves.  MUSCULOSKELETAL:  No joint deformities.   EKG:  Normal sinus rhythm; borderline left atrial abnormality; slightly  delayed R wave progression; inferior and anterolateral ST segment  depression with T wave inversion.   IMPRESSION:  Donna Daniels Daniels presents with atypical chest discomfort and a  negative cardiac catheterization some 10 years ago.  The likelihood that  she has  developed significant coronary disease in the interim is fairly  low, especially with modest risk factors; however, her EKG abnormalities  are impressive.  Other than the tracing in your office, which showed  similar findings but less impressive T wave inversion inferiorly, the  patient does not think she has had an EKG since her catheterization.  We  will seek those records and plan a stress nuclear study.  Her laboratory  tests from your office were reviewed.  In the absence of definite  vascular disease, she does not require lipid lowering therapy.  I will  let you  know the results of her stress test as soon as it has been  completed.  Thanks for sending Donna Daniels nice lady back to [place].    Sincerely,      Gerrit Friends. Dietrich Pates, MD, [facility]  Electronically Signed   RMR/MedQ  DD: 09/03/2006  DT: 09/03/2006  Job #: [id number]

## 2010-11-14 NOTE — Letter (Signed)
September 13, 2006    Kirk Ruths, M.D.  [address]  [place], [place] [address]   RE:  Donna Daniels, Donna Daniels  MRN:  [id number]  /  DOB:  09/10/1940   Dear Annette Stable:   Ms. Cosner returns to the office for continuing assessment of chest  discomfort.  She continues to note episodes of left scapular and left  shoulder discomfort that radiate through to the chest.  These are  relieved with nonsteroidal analgesics.  A stress Myoview study was  negative, with an equivocal EKG but normal myocardial perfusion.   PHYSICAL EXAMINATION:  GENERAL:  On exam, a pleasant trim woman with  somewhat a raspy voice.  VITAL SIGNS:  Her weight is 150, 2 pounds more than 2 weeks ago.  Blood  pressure 145/80, heart rate 75 and regular, respirations 16.  LUNGS:  Clear.  There is a 4th heart sound and minor systolic murmur on  cardiac exam.   IMPRESSION:  Ms. Koors has symptoms that are most likely musculo-  skeletal in origin.  As long as these are controlled with modest doses  of over-the-counter analgesics, I do think further evaluation was  necessarily warranted.  If she has additional problems, you may wish to  refer her to an orthopedic surgeon.  Please let me know at any time in  the future that I can assist with the care of this nice woman.    Sincerely,      Gerrit Friends. Dietrich Pates, MD, [facility]  Electronically Signed    RMR/MedQ  DD: 09/13/2006  DT: 09/14/2006  Job #: [id number]

## 2010-11-14 NOTE — Procedures (Signed)
NAMEJALEA, BRONAUGH              ACCOUNT NO.:  [id number]   MEDICAL RECORD NO.:  [id number]          PATIENT TYPE:  OBV   LOCATION:  A207                          FACILITY:  [facility]   PHYSICIAN:  Kofi A. Gerilyn Pilgrim, M.D. DATE OF BIRTH:  Jul 24, 1940   DATE OF PROCEDURE:  04/28/2007  DATE OF DISCHARGE:  04/26/2007                              EEG INTERPRETATION   This is a 70 year old lady who presents with altered mental status.  The  evaluation is done to rule out seizure.  The patient has had some  spells, visual obscuration and vertigo.  These spells are potentially  worrisome for seizure.   MEDICATIONS:  Zocor, Synthroid, Vicodin, Lovenox, aspirin, Phenergan,  Zofran, Ventolin and Tylenol.   A is a 16-channel recording is carried out for 22 minutes.  There is a  posterior rhythm of 8.5 Hz which attenuates with eye opening.  Awake and  sleep activities are recorded.  There are spindles and K complex seen,  indicating stages of sleep.  Photic stimulation is carried out without  significant changes in the background activity.  There is no focal or  lateralized slowing.  There is no epileptiform activity observed.   IMPRESSION:  Normal recording of the awake and sleep states.      Kofi A. Gerilyn Pilgrim, M.D.  Electronically Signed     KAD/MEDQ  D:  04/28/2007  T:  04/28/2007  Job:  [id number]

## 2011-04-08 LAB — BASIC METABOLIC PANEL
BUN: 8
Chloride: 102
Creatinine, Ser: 0.76
GFR calc Af Amer: >60
GFR calc non Af Amer: >60
Glucose, Bld: 101 — ABNORMAL HIGH
Potassium: 3.5

## 2011-04-08 LAB — CBC
Hemoglobin: 12.3
MCHC: 33.8
MCV: 85.5
Platelets: 282
RBC: 4.24

## 2011-04-08 LAB — CK TOTAL AND CKMB (NOT AT ARMC)
CK, MB: 1.6
Total CK: 109

## 2011-04-08 LAB — LIPID PANEL
Cholesterol: 168
HDL: 42
Total CHOL/HDL Ratio: 4
Triglycerides: 136
VLDL: 27

## 2011-04-08 LAB — RPR: RPR Ser Ql: NONREACTIVE

## 2011-04-08 LAB — CARDIAC PANEL(CRET KIN+CKTOT+MB+TROPI)
CK, MB: 1.3
Total CK: 71
Troponin I: 0.01

## 2011-04-08 LAB — HEMOGLOBIN A1C: Hgb A1c MFr Bld: 5.7

## 2011-04-08 LAB — PROTIME-INR
INR: 0.9
Prothrombin Time: 12.4

## 2011-04-08 LAB — HEPATIC FUNCTION PANEL
Indirect Bilirubin: 0.5
Total Bilirubin: 0.6

## 2011-04-08 LAB — DIFFERENTIAL
Lymphocytes Relative: 34
Lymphs Abs: 2.8
Monocytes Relative: 10

## 2011-04-08 LAB — URINALYSIS, ROUTINE W REFLEX MICROSCOPIC
Bilirubin Urine: NEGATIVE
Ketones, ur: NEGATIVE
Nitrite: NEGATIVE
Urobilinogen, UA: 0.2

## 2011-04-08 LAB — APTT: aPTT: 28

## 2011-04-08 LAB — HOMOCYSTEINE: Homocysteine: 11.7

## 2011-09-15 ENCOUNTER — Other Ambulatory Visit (HOSPITAL_COMMUNITY): Payer: Self-pay | Admitting: Family Medicine

## 2011-09-15 ENCOUNTER — Ambulatory Visit (HOSPITAL_COMMUNITY)
Admission: RE | Admit: 2011-09-15 | Discharge: 2011-09-15 | Disposition: A | Discharge status: Home / Self Care | Payer: Medicare Other | Source: Ambulatory Visit | Attending: Family Medicine | Admitting: Family Medicine

## 2011-09-15 DIAGNOSIS — M542 Cervicalgia: Secondary | ICD-10-CM

## 2011-09-15 DIAGNOSIS — M159 Polyosteoarthritis, unspecified: Secondary | ICD-10-CM | POA: Diagnosis not present

## 2011-09-15 DIAGNOSIS — M5030 Other cervical disc degeneration, unspecified cervical region: Secondary | ICD-10-CM

## 2011-09-15 DIAGNOSIS — R7301 Impaired fasting glucose: Secondary | ICD-10-CM | POA: Diagnosis not present

## 2011-09-15 DIAGNOSIS — E039 Hypothyroidism, unspecified: Secondary | ICD-10-CM | POA: Diagnosis not present

## 2011-09-15 DIAGNOSIS — M47812 Spondylosis without myelopathy or radiculopathy, cervical region: Secondary | ICD-10-CM | POA: Insufficient documentation

## 2011-09-15 DIAGNOSIS — I10 Essential (primary) hypertension: Secondary | ICD-10-CM | POA: Diagnosis not present

## 2011-09-15 DIAGNOSIS — IMO0002 Reserved for concepts with insufficient information to code with codable children: Secondary | ICD-10-CM | POA: Diagnosis not present

## 2011-09-17 ENCOUNTER — Other Ambulatory Visit: Payer: Self-pay | Admitting: Family Medicine

## 2011-09-17 DIAGNOSIS — Z1231 Encounter for screening mammogram for malignant neoplasm of breast: Secondary | ICD-10-CM

## 2011-09-18 ENCOUNTER — Ambulatory Visit (HOSPITAL_COMMUNITY): Payer: Medicare Other

## 2011-09-18 DIAGNOSIS — M5030 Other cervical disc degeneration, unspecified cervical region: Secondary | ICD-10-CM | POA: Diagnosis not present

## 2011-09-30 DIAGNOSIS — M542 Cervicalgia: Secondary | ICD-10-CM | POA: Diagnosis not present

## 2011-10-12 DIAGNOSIS — M47812 Spondylosis without myelopathy or radiculopathy, cervical region: Secondary | ICD-10-CM | POA: Diagnosis not present

## 2011-10-23 ENCOUNTER — Ambulatory Visit
Admission: RE | Admit: 2011-10-23 | Discharge: 2011-10-23 | Disposition: A | Discharge status: Home / Self Care | Payer: Medicare Other | Source: Ambulatory Visit | Attending: Family Medicine | Admitting: Family Medicine

## 2011-10-23 DIAGNOSIS — Z1231 Encounter for screening mammogram for malignant neoplasm of breast: Secondary | ICD-10-CM

## 2011-10-27 ENCOUNTER — Other Ambulatory Visit: Payer: Self-pay | Admitting: Family Medicine

## 2011-10-27 DIAGNOSIS — R928 Other abnormal and inconclusive findings on diagnostic imaging of breast: Secondary | ICD-10-CM

## 2011-10-29 ENCOUNTER — Ambulatory Visit
Admission: RE | Admit: 2011-10-29 | Discharge: 2011-10-29 | Disposition: A | Discharge status: Home / Self Care | Payer: Medicare Other | Source: Ambulatory Visit | Attending: Family Medicine | Admitting: Family Medicine

## 2011-10-29 DIAGNOSIS — R928 Other abnormal and inconclusive findings on diagnostic imaging of breast: Secondary | ICD-10-CM | POA: Diagnosis not present

## 2011-11-04 DIAGNOSIS — M47812 Spondylosis without myelopathy or radiculopathy, cervical region: Secondary | ICD-10-CM | POA: Diagnosis not present

## 2011-12-07 DIAGNOSIS — M47812 Spondylosis without myelopathy or radiculopathy, cervical region: Secondary | ICD-10-CM | POA: Diagnosis not present

## 2011-12-08 ENCOUNTER — Encounter (HOSPITAL_COMMUNITY): Payer: Self-pay | Admitting: Pharmacy Technician

## 2011-12-10 ENCOUNTER — Other Ambulatory Visit: Payer: Self-pay | Admitting: Neurosurgery

## 2011-12-14 ENCOUNTER — Encounter (HOSPITAL_COMMUNITY): Payer: Self-pay

## 2011-12-14 ENCOUNTER — Encounter (HOSPITAL_COMMUNITY)
Admission: RE | Admit: 2011-12-14 | Discharge: 2011-12-14 | Disposition: A | Discharge status: Home / Self Care | Payer: Medicare Other | Source: Ambulatory Visit | Attending: Neurosurgery | Admitting: Neurosurgery

## 2011-12-14 HISTORY — DX: Nausea with vomiting, unspecified: R11.2

## 2011-12-14 HISTORY — DX: Hypothyroidism, unspecified: E03.9

## 2011-12-14 HISTORY — DX: Unspecified osteoarthritis, unspecified site: M19.90

## 2011-12-14 HISTORY — DX: Other specified postprocedural states: Z98.890

## 2011-12-14 LAB — BASIC METABOLIC PANEL
BUN: 11 mg/dL (ref 6–23)
CO2: 27 mEq/L (ref 19–32)
Chloride: 105 mEq/L (ref 96–112)
GFR calc Af Amer: >90 mL/min (ref >90)
Glucose, Bld: 93 mg/dL (ref 70–99)
Potassium: 4.7 mEq/L (ref 3.5–5.1)

## 2011-12-14 LAB — SURGICAL PCR SCREEN: Staphylococcus aureus: NEGATIVE

## 2011-12-14 LAB — CBC
HCT: 40 % (ref 36.0–46.0)
Hemoglobin: 13.4 g/dL (ref 12.0–15.0)
RBC: 4.36 MIL/uL (ref 3.87–5.11)
WBC: 7.6 10*3/uL (ref 4.0–10.5)

## 2011-12-14 NOTE — Pre-Procedure Instructions (Signed)
20 Donna Daniels  12/14/2011   Your procedure is scheduled on: 12/18/11 Friday   Report to [facility] at 530 AM.  Call this number if you have problems the morning of surgery: [phone]   Remember:   Do not eat food:After Midnight.  May have clear liquids: up to 4 Hours before arrival.(until 130 am)  Clear liquids include soda, tea, black coffee, apple or grape juice, broth.  Take these medicines the morning of surgery with A SIP OF WATER: SYNTHROID   Do not wear jewelry, make-up or nail polish.  Do not wear lotions, powders, or perfumes. You may wear deodorant.  Do not shave 48 hours prior to surgery. Men may shave face and neck.  Do not bring valuables to the hospital.  Contacts, dentures or bridgework may not be worn into surgery.  Leave suitcase in the car. After surgery it may be brought to your room.  For patients admitted to the hospital, checkout time is 11:00 AM the day of discharge.   Patients discharged the day of surgery will not be allowed to drive home.  Name and phone number of your driver:   Special Instructions: CHG Shower Use Special Wash: 1/2 bottle night before surgery and 1/2 bottle morning of surgery.   Please read over the following fact sheets that you were given: Pain Booklet, Coughing and Deep Breathing, MRSA Information and Surgical Site Infection Prevention

## 2011-12-17 MED ORDER — CEFAZOLIN SODIUM 1-5 GM-% IV SOLN
1.0000 g | INTRAVENOUS | Status: AC
  Administered 2011-12-18: 1 g via INTRAVENOUS
  Filled 2011-12-17: qty 50

## 2011-12-18 ENCOUNTER — Encounter (HOSPITAL_COMMUNITY): Payer: Self-pay | Admitting: Anesthesiology

## 2011-12-18 ENCOUNTER — Encounter (HOSPITAL_COMMUNITY): Payer: Self-pay | Admitting: *Deleted

## 2011-12-18 ENCOUNTER — Inpatient Hospital Stay (HOSPITAL_COMMUNITY): Payer: Medicare Other

## 2011-12-18 ENCOUNTER — Inpatient Hospital Stay (HOSPITAL_COMMUNITY): Payer: Medicare Other | Admitting: Anesthesiology

## 2011-12-18 ENCOUNTER — Inpatient Hospital Stay (HOSPITAL_COMMUNITY)
Admission: RE | Admit: 2011-12-18 | Discharge: 2011-12-19 | DRG: 473 | Disposition: A | Discharge status: Home / Self Care | Payer: Medicare Other | Source: Ambulatory Visit | Attending: Neurosurgery | Admitting: Neurosurgery

## 2011-12-18 ENCOUNTER — Encounter (HOSPITAL_COMMUNITY)
Admission: RE | Disposition: A | Discharge status: Home / Self Care | Payer: Self-pay | Source: Ambulatory Visit | Attending: Neurosurgery

## 2011-12-18 DIAGNOSIS — M5090 Cervical disc disorder, unspecified, unspecified cervical region: Secondary | ICD-10-CM | POA: Diagnosis not present

## 2011-12-18 DIAGNOSIS — M4802 Spinal stenosis, cervical region: Principal | ICD-10-CM | POA: Diagnosis present

## 2011-12-18 DIAGNOSIS — M47812 Spondylosis without myelopathy or radiculopathy, cervical region: Secondary | ICD-10-CM | POA: Diagnosis not present

## 2011-12-18 DIAGNOSIS — E039 Hypothyroidism, unspecified: Secondary | ICD-10-CM | POA: Diagnosis present

## 2011-12-18 DIAGNOSIS — Z981 Arthrodesis status: Secondary | ICD-10-CM

## 2011-12-18 HISTORY — PX: ANTERIOR CERVICAL DECOMP/DISCECTOMY FUSION: SHX1161

## 2011-12-18 SURGERY — ANTERIOR CERVICAL DECOMPRESSION/DISCECTOMY FUSION 2 LEVELS
Anesthesia: General | Wound class: Clean

## 2011-12-18 MED ORDER — MIDAZOLAM HCL 2 MG/2ML IJ SOLN: 0.5000 mg | Freq: Once | INTRAMUSCULAR | Status: DC | PRN

## 2011-12-18 MED ORDER — ONDANSETRON HCL 4 MG/2ML IJ SOLN
4.0000 mg | INTRAMUSCULAR | Status: DC | PRN
  Administered 2011-12-18 (×2): 4 mg via INTRAVENOUS
  Filled 2011-12-18 (×2): qty 2

## 2011-12-18 MED ORDER — HYDROMORPHONE HCL PF 1 MG/ML IJ SOLN
1.0000 mg | INTRAMUSCULAR | Status: DC | PRN
  Administered 2011-12-18: 1 mg via INTRAMUSCULAR
  Filled 2011-12-18: qty 1

## 2011-12-18 MED ORDER — MEPERIDINE HCL 25 MG/ML IJ SOLN: 6.2500 mg | INTRAMUSCULAR | Status: DC | PRN

## 2011-12-18 MED ORDER — LEVOTHYROXINE SODIUM 100 MCG PO TABS
100.0000 ug | ORAL_TABLET | Freq: Every day | ORAL | Status: DC
  Filled 2011-12-18: qty 1

## 2011-12-18 MED ORDER — SODIUM CHLORIDE 0.9 % IJ SOLN
3.0000 mL | Freq: Two times a day (BID) | INTRAMUSCULAR | Status: DC
  Administered 2011-12-18: 3 mL via INTRAVENOUS

## 2011-12-18 MED ORDER — DEXAMETHASONE SODIUM PHOSPHATE 4 MG/ML IJ SOLN
4.0000 mg | Freq: Four times a day (QID) | INTRAMUSCULAR | Status: AC
  Administered 2011-12-18 (×2): 4 mg via INTRAVENOUS
  Filled 2011-12-18 (×2): qty 1

## 2011-12-18 MED ORDER — DEXAMETHASONE 4 MG PO TABS: 4.0000 mg | ORAL_TABLET | Freq: Four times a day (QID) | ORAL | Status: AC

## 2011-12-18 MED ORDER — HEMOSTATIC AGENTS (NO CHARGE) OPTIME
TOPICAL | Status: DC | PRN
  Administered 2011-12-18: 1 via TOPICAL

## 2011-12-18 MED ORDER — 0.9 % SODIUM CHLORIDE (POUR BTL) OPTIME
TOPICAL | Status: DC | PRN
  Administered 2011-12-18: 1000 mL

## 2011-12-18 MED ORDER — ONDANSETRON HCL 4 MG/2ML IJ SOLN
INTRAMUSCULAR | Status: DC | PRN
  Administered 2011-12-18: 4 mg via INTRAVENOUS

## 2011-12-18 MED ORDER — ACETAMINOPHEN 325 MG PO TABS
650.0000 mg | ORAL_TABLET | ORAL | Status: DC | PRN
  Administered 2011-12-19: 650 mg via ORAL
  Filled 2011-12-18: qty 2

## 2011-12-18 MED ORDER — ZOLPIDEM TARTRATE 5 MG PO TABS: 5.0000 mg | ORAL_TABLET | Freq: Every evening | ORAL | Status: DC | PRN

## 2011-12-18 MED ORDER — ROCURONIUM BROMIDE 100 MG/10ML IV SOLN
INTRAVENOUS | Status: DC | PRN
  Administered 2011-12-18: 50 mg via INTRAVENOUS

## 2011-12-18 MED ORDER — CEFAZOLIN SODIUM 1-5 GM-% IV SOLN
1.0000 g | Freq: Three times a day (TID) | INTRAVENOUS | Status: AC
  Administered 2011-12-18 (×2): 1 g via INTRAVENOUS
  Filled 2011-12-18 (×2): qty 50

## 2011-12-18 MED ORDER — FENTANYL CITRATE 0.05 MG/ML IJ SOLN
INTRAMUSCULAR | Status: DC | PRN
  Administered 2011-12-18: 250 ug via INTRAVENOUS

## 2011-12-18 MED ORDER — SODIUM CHLORIDE 0.9 % IJ SOLN: 3.0000 mL | INTRAMUSCULAR | Status: DC | PRN

## 2011-12-18 MED ORDER — SIMVASTATIN 40 MG PO TABS
40.0000 mg | ORAL_TABLET | Freq: Every evening | ORAL | Status: DC
  Filled 2011-12-18 (×2): qty 1

## 2011-12-18 MED ORDER — KCL IN DEXTROSE-NACL 20-5-0.45 MEQ/L-%-% IV SOLN
80.0000 mL/h | INTRAVENOUS | Status: DC
  Administered 2011-12-18: 80 mL/h via INTRAVENOUS
  Filled 2011-12-18 (×3): qty 1000

## 2011-12-18 MED ORDER — MENTHOL 3 MG MT LOZG
1.0000 | LOZENGE | OROMUCOSAL | Status: DC | PRN
  Administered 2011-12-18: 3 mg via ORAL
  Filled 2011-12-18: qty 9

## 2011-12-18 MED ORDER — LIDOCAINE HCL (CARDIAC) 20 MG/ML IV SOLN
INTRAVENOUS | Status: DC | PRN
  Administered 2011-12-18: 40 mg via INTRAVENOUS

## 2011-12-18 MED ORDER — LACTATED RINGERS IV SOLN
INTRAVENOUS | Status: DC | PRN
  Administered 2011-12-18 (×2): via INTRAVENOUS

## 2011-12-18 MED ORDER — NEOSTIGMINE METHYLSULFATE 1 MG/ML IJ SOLN
INTRAMUSCULAR | Status: DC | PRN
  Administered 2011-12-18: 3 mg via INTRAVENOUS

## 2011-12-18 MED ORDER — THROMBIN 5000 UNITS EX SOLR
CUTANEOUS | Status: DC | PRN
  Administered 2011-12-18: 5000 [IU] via TOPICAL

## 2011-12-18 MED ORDER — ACETAMINOPHEN 650 MG RE SUPP: 650.0000 mg | RECTAL | Status: DC | PRN

## 2011-12-18 MED ORDER — DEXAMETHASONE SODIUM PHOSPHATE 10 MG/ML IJ SOLN: 10.0000 mg | INTRAMUSCULAR | Status: DC

## 2011-12-18 MED ORDER — HYDROMORPHONE HCL PF 1 MG/ML IJ SOLN: 0.2500 mg | INTRAMUSCULAR | Status: DC | PRN

## 2011-12-18 MED ORDER — BACITRACIN 50000 UNITS IM SOLR
INTRAMUSCULAR | Status: AC
  Filled 2011-12-18: qty 1

## 2011-12-18 MED ORDER — SODIUM CHLORIDE 0.9 % IV SOLN
INTRAVENOUS | Status: AC
  Filled 2011-12-18: qty 500

## 2011-12-18 MED ORDER — HYDROCODONE-ACETAMINOPHEN 5-325 MG PO TABS: 1.0000 | ORAL_TABLET | ORAL | Status: DC | PRN

## 2011-12-18 MED ORDER — SODIUM CHLORIDE 0.9 % IR SOLN
Status: DC | PRN
  Administered 2011-12-18: 08:00:00

## 2011-12-18 MED ORDER — PROPOFOL 10 MG/ML IV EMUL
INTRAVENOUS | Status: DC | PRN
  Administered 2011-12-18: 10 mg via INTRAVENOUS
  Administered 2011-12-18: 100 mg via INTRAVENOUS
  Administered 2011-12-18: 20 mg via INTRAVENOUS

## 2011-12-18 MED ORDER — PROMETHAZINE HCL 25 MG/ML IJ SOLN: 6.2500 mg | INTRAMUSCULAR | Status: DC | PRN

## 2011-12-18 MED ORDER — GLYCOPYRROLATE 0.2 MG/ML IJ SOLN
INTRAMUSCULAR | Status: DC | PRN
  Administered 2011-12-18: 0.4 mg via INTRAVENOUS

## 2011-12-18 MED ORDER — PHENOL 1.4 % MT LIQD: 1.0000 | OROMUCOSAL | Status: DC | PRN

## 2011-12-18 MED ORDER — PANTOPRAZOLE SODIUM 40 MG IV SOLR
40.0000 mg | Freq: Every day | INTRAVENOUS | Status: DC
  Administered 2011-12-18: 40 mg via INTRAVENOUS
  Filled 2011-12-18 (×3): qty 40

## 2011-12-18 MED ORDER — DEXAMETHASONE SODIUM PHOSPHATE 10 MG/ML IJ SOLN
INTRAMUSCULAR | Status: AC
  Administered 2011-12-18: 10 mg via INTRAVENOUS
  Filled 2011-12-18: qty 1

## 2011-12-18 MED ORDER — PROMETHAZINE HCL 25 MG/ML IJ SOLN
12.5000 mg | Freq: Four times a day (QID) | INTRAMUSCULAR | Status: DC | PRN
  Administered 2011-12-18: 12.5 mg via INTRAVENOUS
  Filled 2011-12-18: qty 1

## 2011-12-18 MED ORDER — MIDAZOLAM HCL 5 MG/5ML IJ SOLN
INTRAMUSCULAR | Status: DC | PRN
  Administered 2011-12-18: 0.5 mg via INTRAVENOUS

## 2011-12-18 MED ORDER — CYCLOBENZAPRINE HCL 10 MG PO TABS: 10.0000 mg | ORAL_TABLET | Freq: Three times a day (TID) | ORAL | Status: DC | PRN

## 2011-12-18 SURGICAL SUPPLY — 49 items
APL SKNCLS STERI-STRIP NONHPOA (GAUZE/BANDAGES/DRESSINGS) ×1
BAG DECANTER FOR FLEXI CONT (MISCELLANEOUS) ×2 IMPLANT
BENZOIN TINCTURE PRP APPL 2/3 (GAUZE/BANDAGES/DRESSINGS) ×4 IMPLANT
BIT DRILL TRINICA 2.3MM (BIT) IMPLANT
BRUSH SCRUB EZ PLAIN DRY (MISCELLANEOUS) ×2 IMPLANT
CANISTER SUCTION 2500CC (MISCELLANEOUS) ×2 IMPLANT
CLOTH BEACON ORANGE TIMEOUT ST (SAFETY) ×2 IMPLANT
CONT SPEC 4OZ CLIKSEAL STRL BL (MISCELLANEOUS) ×2 IMPLANT
DRAPE C-ARM 42X72 X-RAY (DRAPES) ×4 IMPLANT
DRAPE LAPAROTOMY 100X72 PEDS (DRAPES) ×2 IMPLANT
DRAPE MICROSCOPE ZEISS OPMI (DRAPES) ×2 IMPLANT
DRAPE SURG 17X23 STRL (DRAPES) ×4 IMPLANT
DRESSING TELFA 8X3 (GAUZE/BANDAGES/DRESSINGS) ×2 IMPLANT
DRILL BIT TRINICA 2.3MM (BIT) ×2
ELECT COATED BLADE 2.86 ST (ELECTRODE) ×2 IMPLANT
ELECT REM PT RETURN 9FT ADLT (ELECTROSURGICAL) ×2
ELECTRODE REM PT RTRN 9FT ADLT (ELECTROSURGICAL) ×1 IMPLANT
GAUZE SPONGE 4X4 16PLY XRAY LF (GAUZE/BANDAGES/DRESSINGS) IMPLANT
GLOVE ECLIPSE 7.5 STRL STRAW (GLOVE) ×6 IMPLANT
GLOVE EXAM NITRILE MD LF STRL (GLOVE) ×2 IMPLANT
GLOVE EXAM NITRILE XL STR (GLOVE) IMPLANT
GLOVE EXAM NITRILE XS STR PU (GLOVE) IMPLANT
GLOVE INDICATOR 8.0 STRL GRN (GLOVE) ×1 IMPLANT
GOWN BRE IMP SLV AUR LG STRL (GOWN DISPOSABLE) IMPLANT
GOWN BRE IMP SLV AUR XL STRL (GOWN DISPOSABLE) IMPLANT
GOWN STRL REIN 2XL LVL4 (GOWN DISPOSABLE) ×2 IMPLANT
HEAD HALTER (SOFTGOODS) ×2 IMPLANT
KIT BASIN OR (CUSTOM PROCEDURE TRAY) ×2 IMPLANT
KIT ROOM TURNOVER OR (KITS) ×2 IMPLANT
NDL SPNL 20GX3.5 QUINCKE YW (NEEDLE) ×1 IMPLANT
NEEDLE SPNL 20GX3.5 QUINCKE YW (NEEDLE) ×2 IMPLANT
NS IRRIG 1000ML POUR BTL (IV SOLUTION) ×2 IMPLANT
PACK LAMINECTOMY NEURO (CUSTOM PROCEDURE TRAY) ×2 IMPLANT
PAD ARMBOARD 7.5X6 YLW CONV (MISCELLANEOUS) ×2 IMPLANT
PATTIES SURGICAL .75X.75 (GAUZE/BANDAGES/DRESSINGS) ×2 IMPLANT
PUTTY BONE GRAFT KIT 2.5ML (Bone Implant) ×1 IMPLANT
RUBBERBAND STERILE (MISCELLANEOUS) ×4 IMPLANT
SPONGE GAUZE 4X4 12PLY (GAUZE/BANDAGES/DRESSINGS) ×2 IMPLANT
SPONGE INTESTINAL PEANUT (DISPOSABLE) ×2 IMPLANT
SPONGE SURGIFOAM ABS GEL SZ50 (HEMOSTASIS) ×2 IMPLANT
STRIP CLOSURE SKIN 1/2X4 (GAUZE/BANDAGES/DRESSINGS) ×2 IMPLANT
SUT PDS AB 5-0 P3 18 (SUTURE) ×2 IMPLANT
SUT VIC AB 3-0 CP2 18 (SUTURE) ×2 IMPLANT
SYR 20ML ECCENTRIC (SYRINGE) IMPLANT
TOOL MATCHSTK 3MM (MISCELLANEOUS) ×2 IMPLANT
TOWEL OR 17X24 6PK STRL BLUE (TOWEL DISPOSABLE) ×2 IMPLANT
TOWEL OR 17X26 10 PK STRL BLUE (TOWEL DISPOSABLE) ×2 IMPLANT
TRAP SPECIMEN MUCOUS 40CC (MISCELLANEOUS) ×1 IMPLANT
WATER STERILE IRR 1000ML POUR (IV SOLUTION) ×2 IMPLANT

## 2011-12-18 NOTE — Anesthesia Procedure Notes (Addendum)
Performed by: Julianne Rice K   Procedure Name: Intubation Date/Time: 12/18/2011 7:48 AM Performed by: Julianne Rice K Pre-anesthesia Checklist: Emergency Drugs available, Patient identified, Timeout performed, Suction available and Patient being monitored Patient Re-evaluated:Patient Re-evaluated prior to inductionOxygen Delivery Method: Circle system utilized Preoxygenation: Pre-oxygenation with 100% oxygen Intubation Type: IV induction Ventilation: Mask ventilation without difficulty Laryngoscope Size: Miller and 2 Grade View: Grade II Tube type: Oral Tube size: 7.5 mm Number of attempts: 1 Airway Equipment and Method: Stylet Placement Confirmation: ETT inserted through vocal cords under direct vision,  breath sounds checked- equal and bilateral and positive ETCO2 Secured at: 23 cm Tube secured with: Tape Dental Injury: Teeth and Oropharynx as per pre-operative assessment

## 2011-12-18 NOTE — Transfer of Care (Signed)
Immediate Anesthesia Transfer of Care Note  Patient: Donna Daniels  Procedure(s) Performed: Procedure(s) (LRB): ANTERIOR CERVICAL DECOMPRESSION/DISCECTOMY FUSION 2 LEVELS (N/A)  Patient Location: PACU  Anesthesia Type: General  Level of Consciousness: awake, alert  and oriented  Airway & Oxygen Therapy: Patient Spontanous Breathing and Patient connected to nasal cannula oxygen  Post-op Assessment: Report given to PACU RN, Post -op Vital signs reviewed and stable, Patient moving all extremities and Patient able to stick tongue midline  Post vital signs: Reviewed and stable  Complications: No apparent anesthesia complications

## 2011-12-18 NOTE — Progress Notes (Signed)
UR COMPLETED  

## 2011-12-18 NOTE — Transfer of Care (Signed)
Immediate Anesthesia Transfer of Care Note  Patient: Donna Daniels  Procedure(s) Performed: Procedure(s) (LRB): ANTERIOR CERVICAL DECOMPRESSION/DISCECTOMY FUSION 2 LEVELS (N/A)  Patient Location: PACU  Anesthesia Type: General  Level of Consciousness: awake, alert  and oriented  Airway & Oxygen Therapy: Patient Spontanous Breathing and Patient connected to nasal cannula oxygen  Post-op Assessment: Report given to PACU RN, Post -op Vital signs reviewed and stable, Patient moving all extremities and Patient able to stick tongue midline  Post vital signs: Reviewed and stable  Complications: No apparent anesthesia complications 

## 2011-12-18 NOTE — Anesthesia Preprocedure Evaluation (Addendum)
Anesthesia Evaluation  Patient identified by MRN, date of birth, ID band  Reviewed: Allergy & Precautions, H&P , NPO status , Patient's Chart, lab work & pertinent test results  History of Anesthesia Complications (+) PONV  Airway Mallampati: I TM Distance: >3 FB Neck ROM: Full    Dental  (+) Edentulous Upper, Partial Lower and Dental Advisory Given   Pulmonary neg pulmonary ROS,  breath sounds clear to auscultation        Cardiovascular negative cardio ROS  Rhythm:Regular Rate:Normal     Neuro/Psych negative psych ROS   GI/Hepatic negative GI ROS, Neg liver ROS,   Endo/Other  Hypothyroidism (on replacement therapy)   Renal/GU negative Renal ROS     Musculoskeletal  (+) Arthritis -, Osteoarthritis,    Abdominal   Peds  Hematology negative hematology ROS (+)   Anesthesia Other Findings   Reproductive/Obstetrics negative OB ROS                          Anesthesia Physical Anesthesia Plan  ASA: II  Anesthesia Plan: General   Post-op Pain Management:    Induction: Intravenous  Airway Management Planned: Oral ETT  Additional Equipment:   Intra-op Plan:   Post-operative Plan: Extubation in OR  Informed Consent: I have reviewed the patients History and Physical, chart, labs and discussed the procedure including the risks, benefits and alternatives for the proposed anesthesia with the patient or authorized representative who has indicated his/her understanding and acceptance.   Dental advisory given  Plan Discussed with: Anesthesiologist, Surgeon and CRNA  Anesthesia Plan Comments: (Plan routine monitors, GETA)       Anesthesia Quick Evaluation

## 2011-12-18 NOTE — Plan of Care (Signed)
Problem: Consults Goal: Diagnosis - Spinal Surgery Outcome: Completed/Met Date Met:  12/18/11 Cervical Spine Fusion

## 2011-12-18 NOTE — H&P (Signed)
  Donna Daniels is an 71 y.o. female.   Chief Complaint: Neck and left arm pain HPI: The patient is a 71 year old female who presented to the office with neck and left arm pain. She been tried on a variety of conservative therapies including medications and therapy all without relief. Imaging studies showed stenosis and nerve root compression at C4-5 and C5-6. After failing conservative therapy she requested surgery and now comes for a two-level anterior cervical discectomy with fusion and plating. I had a long discussion with her regarding the risks and benefits of surgical intervention. The risks discussed include but are not limited to bleeding infection weakness numbness paralysis spinal fluid leakage hoarseness nonunion coma and death. We have discussed alternative methods of therapy offered risks and benefits of nonintervention. She has had the opportunity to ask numerous questions and appears to understand. With this information in hand she has requested we proceed with surgery.  Past Medical History  Diagnosis Date  . High cholesterol   . Thyroid disorder   . PONV (postoperative nausea and vomiting)   . Hypothyroidism   . Arthritis     Past Surgical History  Procedure Date  . Cesarean section   . Vesicovaginal fistula closure w/ tah   . Thyroidectomy   . Hand surgery   . Abdominal hysterectomy     History reviewed. No pertinent family history. Social History:  reports that she has never smoked. She does not have any smokeless tobacco history on file. She reports that she does not drink alcohol or use illicit drugs.  Allergies: No Known Allergies  Medications Prior to Admission  Medication Sig Dispense Refill  . levothyroxine (SYNTHROID, LEVOTHROID) 100 MCG tablet Take 100 mcg by mouth daily.      . Multiple Vitamin (MULTIVITAMIN WITH MINERALS) TABS Take 1 tablet by mouth daily.      . nabumetone (RELAFEN) 500 MG tablet Take 500 mg by mouth 2 (two) times daily.      Marland Kitchen  omega-3 acid ethyl esters (LOVAZA) 1 G capsule Take 1 g by mouth 3 (three) times daily.      . simvastatin (ZOCOR) 40 MG tablet Take 40 mg by mouth every evening.        No results found for this or any previous visit (from the past 48 hour(s)). No results found.  A comprehensive review of systems was negative.  Blood pressure 118/79, pulse 73, temperature 98 F (36.7 C), temperature source Oral, resp. rate 18, SpO2 100.00%.  The patient is awake alert and oriented. She has no facial asymmetry. Her gait is nonantalgic. 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 The plan is for a two-level anterior cervical discectomy with fusion and plating.  Reinaldo Meeker, MD 12/18/2011, 7:33 AM

## 2011-12-18 NOTE — Preoperative (Signed)
Beta Blockers   Reason not to administer Beta Blockers:Not Applicable 

## 2011-12-18 NOTE — Op Note (Signed)
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 Localizing fluoroscopy was used prior to incision to identify the appropriate level. Transverse incision was made in the right anterior At the midline and headed towards the medial aspect of the sternocleidomastoid muscle. The platysma muscle was incised transversely and the natural fascial plane between the strap muscles medially and the sternocleidomastoid laterally was identified and followed down to the interest of the cervical spine. Longus Cole muscles were identified split in the midline to play bilaterally with the Kitner dissector and unipolar coagulation. Self-retaining tract was placed for exposure and x-ray showed approach the appropriate levels. Using a 15 blade the annulus the disc was incised at C4-5 and C5-6. Using pituitary rongeurs and curettes approximately 90% of the disc material was removed. High-speed drill was used to widen the interspace and bony shavings were saved for use later in the case. At this time the microscope was draped brought into the field and used for the remainder of the case. Starting at C4-5 using microdissection technique to remove the remainder of the disc to the posterior longitudinal ligament. Ligament was incised transversely and the cut edges removed a Kerrison punch. Thorough decompression was carried out on the spinal dura to the proximal foramen bilaterally and then further out the foramen on the left comes up . C5 nerve root was well visualized well decompressed. Attention was then turned to C5-C6 were similar decompression was carried out.  Once again thorough decompression of spinal dura into the foramen bilaterally was carried out until the nerve roots were well visualized well decompressed. At this time inspection was carried out in all directions for any evidence of residual compression at either level and none could be identified. Large amounts of irrigation were carried out and any bleeding control proper coagulation and Gelfoam. Measurements were taken and to 6 mm lordotic graft were chosen. They were filled with a mixture of autologous bone and morselized allograft and impacted without difficulty once we confirmed hemostasis once again. We then chose an appropriately length Trinica anterior cervical plate. We placed for a holes followed by 40 mm screws then rotated the locking device into the locked position. Final fluoroscopy showed good placed of the spacers plate and screws. Irrigation was carried out and any bleeding control proper coagulation. The was then closed with inverted Vicryl on the platysma muscle inverted 5-0 PDS in the subcuticular layer and Steri-Strips on the skin. Shortness was then applied the patient was extubated covered stable condition.

## 2011-12-18 NOTE — Anesthesia Postprocedure Evaluation (Signed)
  Anesthesia Post-op Note  Patient: Donna Daniels  Procedure(s) Performed: Procedure(s) (LRB): ANTERIOR CERVICAL DECOMPRESSION/DISCECTOMY FUSION 2 LEVELS (N/A)  Patient Location: PACU  Anesthesia Type: General  Level of Consciousness: awake, alert  and oriented  Airway and Oxygen Therapy: Patient Spontanous Breathing and Patient connected to nasal cannula oxygen  Post-op Pain: mild  Post-op Assessment: Post-op Vital signs reviewed, Patient's Cardiovascular Status Stable, Respiratory Function Stable, Patent Airway, No signs of Nausea or vomiting and Pain level controlled  Post-op Vital Signs: Reviewed and stable  Complications: No apparent anesthesia complications

## 2011-12-19 MED ORDER — HYDROCODONE-ACETAMINOPHEN 5-325 MG PO TABS: 1.0000 | ORAL_TABLET | Freq: Four times a day (QID) | ORAL | Status: AC | PRN

## 2011-12-19 NOTE — Discharge Summary (Signed)
Physician Discharge Summary  Patient ID: Donna Daniels MRN: [id number] DOB/AGE: Mar 03, 1941 71 y.o.  Admit date: 12/18/2011 Discharge date: 12/19/2011  Admission Diagnoses: cervical spondylosis    Discharge Diagnoses: same   Discharged Condition: good  Hospital Course: The patient was admitted on 12/18/2011 and taken to the operating room where the patient underwent acdf. The patient tolerated the procedure well and was taken to the recovery room and then to the floor in stable condition. The hospital course was routine. There were no complications. The wound remained clean dry and intact. Pt had appropriate neck soreness. No complaints of arm pain or new N/T/W. The patient remained afebrile with stable vital signs, and tolerated a regular diet. The patient continued to increase activities, and pain was well controlled with oral pain medications.   Consults: None  Significant Diagnostic Studies:  Results for orders placed during the hospital encounter of 12/14/11  SURGICAL PCR SCREEN      Component Value Range   MRSA, PCR NEGATIVE  NEGATIVE   Staphylococcus aureus NEGATIVE  NEGATIVE  CBC      Component Value Range   WBC 7.6  4.0 - 10.5 K/uL   RBC 4.36  3.87 - 5.11 MIL/uL   Hemoglobin 13.4  12.0 - 15.0 g/dL   HCT 46.9  62.9 - 52.8 %   MCV 91.7  78.0 - 100.0 fL   MCH 30.7  26.0 - 34.0 pg   MCHC 33.5  30.0 - 36.0 g/dL   RDW 41.3  24.4 - 01.0 %   Platelets 252  150 - 400 K/uL  BASIC METABOLIC PANEL      Component Value Range   Sodium 142  135 - 145 mEq/L   Potassium 4.7  3.5 - 5.1 mEq/L   Chloride 105  96 - 112 mEq/L   CO2 27  19 - 32 mEq/L   Glucose, Bld 93  70 - 99 mg/dL   BUN 11  6 - 23 mg/dL   Creatinine, Ser 2.72  0.50 - 1.10 mg/dL   Calcium 9.8  8.4 - 53.6 mg/dL   GFR calc non Af Amer 83 (*) >90 mL/min   GFR calc Af Amer >90  >90 mL/min    Dg Cervical Spine 2-3 Views  12/18/2011  *RADIOLOGY REPORT*  Clinical Data: Cervical disc disease.  CERVICAL SPINE - 2-3 VIEW   Comparison: Radiographs dated 09/15/2011  Findings: Lateral C-arm images demonstrate the patient has undergone anterior cervical fusion at C4-5 and C5-6.  The anterior plate and screws and an interbody fusion devices appear in good position in the lateral projection.  Alignment of the cervical vertebra at those levels is normal.  IMPRESSION: Anterior cervical fusions performed at C4-5 and C5-6.  Original Report Authenticated By: Gwynn Burly, M.D.   Dg C-arm 1-60 Min  12/18/2011   Original Report Authenticated By: Gwynn Burly, M.D.    Antibiotics:  Anti-infectives     Start     Dose/Rate Route Frequency Ordered Stop   12/18/11 1600   ceFAZolin (ANCEF) IVPB 1 g/50 mL premix        1 g 100 mL/hr over 30 Minutes Intravenous Every 8 hours 12/18/11 1152 12/19/11 0019   12/18/11 0800   bacitracin 50,000 Units in sodium chloride irrigation 0.9 % 500 mL irrigation  Status:  Discontinued          As needed 12/18/11 0850 12/18/11 0956   12/18/11 0710   bacitracin 64403 UNITS injection  Comments: RATCLIFF, ESTHER: cabinet override         12/18/11 0710 12/18/11 1929   12/18/11 0000   ceFAZolin (ANCEF) IVPB 1 g/50 mL premix        1 g 100 mL/hr over 30 Minutes Intravenous 60 min pre-op 12/17/11 1323 12/18/11 0740          Discharge Exam: Blood pressure 107/70, pulse 75, temperature 97.9 F (36.6 C), temperature source Oral, resp. rate 16, SpO2 97.00%. Neuro exam normal Incision CDI  Discharge Medications:   Medication List  As of 12/19/2011  8:07 AM   TAKE these medications         HYDROcodone-acetaminophen 5-325 MG per tablet   Commonly known as: NORCO   Take 1-2 tablets by mouth every 6 (six) hours as needed for pain.      levothyroxine 100 MCG tablet   Commonly known as: SYNTHROID, LEVOTHROID   Take 100 mcg by mouth daily.      multivitamin with minerals Tabs   Take 1 tablet by mouth daily.      nabumetone 500 MG tablet   Commonly known as: RELAFEN   Take 500 mg  by mouth 2 (two) times daily.      omega-3 acid ethyl esters 1 G capsule   Commonly known as: LOVAZA   Take 1 g by mouth 3 (three) times daily.      simvastatin 40 MG tablet   Commonly known as: ZOCOR   Take 40 mg by mouth every evening.            Disposition: home   Final Dx: ACDF  Discharge Orders    Future Orders Please Complete By Expires   Diet - low sodium heart healthy      Increase activity slowly      Driving Restrictions      Comments:   2 weeks   Lifting restrictions      Comments:   Less than 8 lbs   Remove dressing in 48 hours      Call MD for:  temperature >100.4      Call MD for:  persistant nausea and vomiting      Call MD for:  severe uncontrolled pain      Call MD for:  redness, tenderness, or signs of infection (pain, swelling, redness, odor or green/yellow discharge around incision site)      Call MD for:  difficulty breathing, headache or visual disturbances         Follow-up Information    Follow up with Reinaldo Meeker, MD. Schedule an appointment as soon as possible for a visit in 2 weeks.   Contact information:   [address] [place] [address] [phone]           Signed: Tia Alert 12/19/2011, 8:07 AM

## 2011-12-24 ENCOUNTER — Encounter (HOSPITAL_COMMUNITY): Payer: Self-pay | Admitting: Neurosurgery

## 2012-01-06 DIAGNOSIS — M47812 Spondylosis without myelopathy or radiculopathy, cervical region: Secondary | ICD-10-CM | POA: Diagnosis not present

## 2012-03-09 DIAGNOSIS — I10 Essential (primary) hypertension: Secondary | ICD-10-CM | POA: Diagnosis not present

## 2012-03-09 DIAGNOSIS — M159 Polyosteoarthritis, unspecified: Secondary | ICD-10-CM | POA: Diagnosis not present

## 2012-03-09 DIAGNOSIS — Z6825 Body mass index (BMI) 25.0-25.9, adult: Secondary | ICD-10-CM | POA: Diagnosis not present

## 2012-03-09 DIAGNOSIS — Z23 Encounter for immunization: Secondary | ICD-10-CM | POA: Diagnosis not present

## 2012-04-14 DIAGNOSIS — J9801 Acute bronchospasm: Secondary | ICD-10-CM | POA: Diagnosis not present

## 2012-04-14 DIAGNOSIS — J209 Acute bronchitis, unspecified: Secondary | ICD-10-CM | POA: Diagnosis not present

## 2012-04-14 DIAGNOSIS — J019 Acute sinusitis, unspecified: Secondary | ICD-10-CM | POA: Diagnosis not present

## 2012-04-14 DIAGNOSIS — E039 Hypothyroidism, unspecified: Secondary | ICD-10-CM | POA: Diagnosis not present

## 2012-04-14 DIAGNOSIS — Z6825 Body mass index (BMI) 25.0-25.9, adult: Secondary | ICD-10-CM | POA: Diagnosis not present

## 2012-05-23 ENCOUNTER — Telehealth: Payer: Self-pay | Admitting: Orthopedic Surgery

## 2012-05-23 NOTE — Telephone Encounter (Signed)
Faxed medical records as per patient's signed request to [facility] to fax # [phone], ph# [phone], for review, for approval for an appointment; states it is for "opposite knee," not the knee due to work-related injury.  States this knee is doing well.  Patient aware fax being sent today, 05/23/12.

## 2012-06-06 DIAGNOSIS — M1710 Unilateral primary osteoarthritis, unspecified knee: Secondary | ICD-10-CM | POA: Diagnosis not present

## 2012-06-07 DIAGNOSIS — M949 Disorder of cartilage, unspecified: Secondary | ICD-10-CM | POA: Diagnosis not present

## 2012-06-07 DIAGNOSIS — M899 Disorder of bone, unspecified: Secondary | ICD-10-CM | POA: Diagnosis not present

## 2012-06-15 DIAGNOSIS — M949 Disorder of cartilage, unspecified: Secondary | ICD-10-CM | POA: Diagnosis not present

## 2012-06-15 DIAGNOSIS — M899 Disorder of bone, unspecified: Secondary | ICD-10-CM | POA: Diagnosis not present

## 2012-06-16 DIAGNOSIS — M949 Disorder of cartilage, unspecified: Secondary | ICD-10-CM | POA: Diagnosis not present

## 2012-06-16 DIAGNOSIS — M899 Disorder of bone, unspecified: Secondary | ICD-10-CM | POA: Diagnosis not present

## 2012-06-16 DIAGNOSIS — M1710 Unilateral primary osteoarthritis, unspecified knee: Secondary | ICD-10-CM | POA: Diagnosis not present

## 2012-08-16 DIAGNOSIS — M1710 Unilateral primary osteoarthritis, unspecified knee: Secondary | ICD-10-CM | POA: Diagnosis not present

## 2012-08-23 DIAGNOSIS — M1710 Unilateral primary osteoarthritis, unspecified knee: Secondary | ICD-10-CM | POA: Diagnosis not present

## 2012-08-31 DIAGNOSIS — M1710 Unilateral primary osteoarthritis, unspecified knee: Secondary | ICD-10-CM | POA: Diagnosis not present

## 2012-09-06 DIAGNOSIS — M1710 Unilateral primary osteoarthritis, unspecified knee: Secondary | ICD-10-CM | POA: Diagnosis not present

## 2012-09-14 DIAGNOSIS — M1710 Unilateral primary osteoarthritis, unspecified knee: Secondary | ICD-10-CM | POA: Diagnosis not present

## 2012-09-21 DIAGNOSIS — M1710 Unilateral primary osteoarthritis, unspecified knee: Secondary | ICD-10-CM | POA: Diagnosis not present

## 2012-10-17 ENCOUNTER — Other Ambulatory Visit: Payer: Self-pay

## 2012-10-17 DIAGNOSIS — Z1231 Encounter for screening mammogram for malignant neoplasm of breast: Secondary | ICD-10-CM

## 2012-10-19 ENCOUNTER — Ambulatory Visit
Admission: RE | Admit: 2012-10-19 | Discharge: 2012-10-19 | Disposition: A | Discharge status: Home / Self Care | Payer: Medicare Other | Source: Ambulatory Visit

## 2012-10-19 DIAGNOSIS — M1710 Unilateral primary osteoarthritis, unspecified knee: Secondary | ICD-10-CM | POA: Diagnosis not present

## 2012-10-19 DIAGNOSIS — Z1231 Encounter for screening mammogram for malignant neoplasm of breast: Secondary | ICD-10-CM

## 2012-10-24 DIAGNOSIS — R7301 Impaired fasting glucose: Secondary | ICD-10-CM | POA: Diagnosis not present

## 2012-10-24 DIAGNOSIS — I10 Essential (primary) hypertension: Secondary | ICD-10-CM | POA: Diagnosis not present

## 2012-10-24 DIAGNOSIS — Z6825 Body mass index (BMI) 25.0-25.9, adult: Secondary | ICD-10-CM | POA: Diagnosis not present

## 2012-10-24 DIAGNOSIS — E785 Hyperlipidemia, unspecified: Secondary | ICD-10-CM | POA: Diagnosis not present

## 2012-10-24 DIAGNOSIS — E039 Hypothyroidism, unspecified: Secondary | ICD-10-CM | POA: Diagnosis not present

## 2012-11-09 DIAGNOSIS — M25559 Pain in unspecified hip: Secondary | ICD-10-CM | POA: Diagnosis not present

## 2012-11-09 DIAGNOSIS — M818 Other osteoporosis without current pathological fracture: Secondary | ICD-10-CM | POA: Diagnosis not present

## 2012-11-09 DIAGNOSIS — M1710 Unilateral primary osteoarthritis, unspecified knee: Secondary | ICD-10-CM | POA: Diagnosis not present

## 2012-11-09 DIAGNOSIS — M48061 Spinal stenosis, lumbar region without neurogenic claudication: Secondary | ICD-10-CM | POA: Diagnosis not present

## 2012-11-16 DIAGNOSIS — M1710 Unilateral primary osteoarthritis, unspecified knee: Secondary | ICD-10-CM | POA: Diagnosis not present

## 2012-11-16 DIAGNOSIS — M4310 Spondylolisthesis, site unspecified: Secondary | ICD-10-CM | POA: Diagnosis not present

## 2012-11-16 DIAGNOSIS — IMO0002 Reserved for concepts with insufficient information to code with codable children: Secondary | ICD-10-CM | POA: Diagnosis not present

## 2012-12-06 DIAGNOSIS — M1710 Unilateral primary osteoarthritis, unspecified knee: Secondary | ICD-10-CM | POA: Diagnosis not present

## 2012-12-20 DIAGNOSIS — IMO0002 Reserved for concepts with insufficient information to code with codable children: Secondary | ICD-10-CM | POA: Diagnosis not present

## 2012-12-20 DIAGNOSIS — M818 Other osteoporosis without current pathological fracture: Secondary | ICD-10-CM | POA: Diagnosis not present

## 2012-12-20 DIAGNOSIS — M1710 Unilateral primary osteoarthritis, unspecified knee: Secondary | ICD-10-CM | POA: Diagnosis not present

## 2012-12-27 DIAGNOSIS — M1710 Unilateral primary osteoarthritis, unspecified knee: Secondary | ICD-10-CM | POA: Diagnosis not present

## 2012-12-27 DIAGNOSIS — M48061 Spinal stenosis, lumbar region without neurogenic claudication: Secondary | ICD-10-CM | POA: Diagnosis not present

## 2013-01-10 DIAGNOSIS — M899 Disorder of bone, unspecified: Secondary | ICD-10-CM | POA: Diagnosis not present

## 2013-01-10 DIAGNOSIS — M949 Disorder of cartilage, unspecified: Secondary | ICD-10-CM | POA: Diagnosis not present

## 2013-01-10 DIAGNOSIS — M1710 Unilateral primary osteoarthritis, unspecified knee: Secondary | ICD-10-CM | POA: Diagnosis not present

## 2013-01-10 DIAGNOSIS — M48061 Spinal stenosis, lumbar region without neurogenic claudication: Secondary | ICD-10-CM | POA: Diagnosis not present

## 2013-01-20 DIAGNOSIS — M47817 Spondylosis without myelopathy or radiculopathy, lumbosacral region: Secondary | ICD-10-CM | POA: Diagnosis not present

## 2013-01-24 DIAGNOSIS — M1710 Unilateral primary osteoarthritis, unspecified knee: Secondary | ICD-10-CM | POA: Diagnosis not present

## 2013-01-24 DIAGNOSIS — IMO0002 Reserved for concepts with insufficient information to code with codable children: Secondary | ICD-10-CM | POA: Diagnosis not present

## 2013-01-24 DIAGNOSIS — M818 Other osteoporosis without current pathological fracture: Secondary | ICD-10-CM | POA: Diagnosis not present

## 2013-02-28 DIAGNOSIS — Z23 Encounter for immunization: Secondary | ICD-10-CM | POA: Diagnosis not present

## 2013-03-08 DIAGNOSIS — M545 Low back pain, unspecified: Secondary | ICD-10-CM | POA: Diagnosis not present

## 2013-03-22 DIAGNOSIS — M1710 Unilateral primary osteoarthritis, unspecified knee: Secondary | ICD-10-CM | POA: Diagnosis not present

## 2013-03-27 DIAGNOSIS — M1710 Unilateral primary osteoarthritis, unspecified knee: Secondary | ICD-10-CM | POA: Diagnosis not present

## 2013-03-29 DIAGNOSIS — M1710 Unilateral primary osteoarthritis, unspecified knee: Secondary | ICD-10-CM | POA: Diagnosis not present

## 2013-03-29 DIAGNOSIS — M25569 Pain in unspecified knee: Secondary | ICD-10-CM | POA: Diagnosis not present

## 2013-04-10 ENCOUNTER — Other Ambulatory Visit: Payer: Self-pay | Admitting: Orthopaedic Surgery

## 2013-04-11 ENCOUNTER — Encounter (HOSPITAL_COMMUNITY): Payer: Self-pay | Admitting: Pharmacy Technician

## 2013-04-12 NOTE — Pre-Procedure Instructions (Signed)
Donna Daniels  04/12/2013   Your procedure is scheduled on: Tuesday, October 21st     Report to [facility]  2 * 3 at 10:50 AM.             (FREE Valet Parking)  Call this number if you have problems the morning of surgery: [phone]   Remember:   Do not eat food or drink liquids after midnight Monday.   Take these medicines the morning of surgery with A SIP OF WATER: Levothyroxine   Do not wear jewelry, make-up or nail polish.  Do not wear lotions, powders, or perfumes. You may wear deodorant.  Do not shave underarms & legs 48 hours prior to surgery.    Do not bring valuables to the hospital.  [facility] is not responsible for any belongings or valuables.               Contacts, dentures or bridgework may not be worn into surgery.  Leave suitcase in the car. After surgery it may be brought to your room.  For patients admitted to the hospital, discharge time is determined by your treatment team.               Name and phone number of your driver:    Special Instructions: Shower using CHG 2 nights before surgery and the night before surgery.  If you shower the day of surgery use CHG.  Use special wash - you have one bottle of CHG for all showers.  You should use approximately 1/3 of the bottle for each shower.   Please read over the following fact sheets that you were given: Pain Booklet, Coughing and Deep Breathing, Blood Transfusion Information, MRSA Information and Surgical Site Infection Prevention

## 2013-04-13 ENCOUNTER — Encounter (HOSPITAL_COMMUNITY): Payer: Self-pay

## 2013-04-13 ENCOUNTER — Encounter (HOSPITAL_COMMUNITY)
Admission: RE | Admit: 2013-04-13 | Discharge: 2013-04-13 | Disposition: A | Discharge status: Home / Self Care | Payer: Medicare Other | Source: Ambulatory Visit | Attending: Orthopaedic Surgery | Admitting: Orthopaedic Surgery

## 2013-04-13 DIAGNOSIS — Z01812 Encounter for preprocedural laboratory examination: Secondary | ICD-10-CM | POA: Diagnosis not present

## 2013-04-13 DIAGNOSIS — Z01810 Encounter for preprocedural cardiovascular examination: Secondary | ICD-10-CM | POA: Diagnosis not present

## 2013-04-13 DIAGNOSIS — Z01818 Encounter for other preprocedural examination: Secondary | ICD-10-CM | POA: Insufficient documentation

## 2013-04-13 LAB — CBC WITH DIFFERENTIAL/PLATELET
Basophils Relative: 1 % (ref 0–1)
Hemoglobin: 14.2 g/dL (ref 12.0–15.0)
Lymphs Abs: 3 10*3/uL (ref 0.7–4.0)
MCHC: 34.1 g/dL (ref 30.0–36.0)
Monocytes Relative: 10 % (ref 3–12)
Neutro Abs: 3.2 10*3/uL (ref 1.7–7.7)
Neutrophils Relative %: 45 % (ref 43–77)
Platelets: 279 10*3/uL (ref 150–400)
RBC: 4.56 MIL/uL (ref 3.87–5.11)

## 2013-04-13 LAB — URINALYSIS, ROUTINE W REFLEX MICROSCOPIC
Bilirubin Urine: NEGATIVE
Glucose, UA: NEGATIVE mg/dL
Ketones, ur: NEGATIVE mg/dL
Leukocytes, UA: NEGATIVE
Nitrite: NEGATIVE
Protein, ur: NEGATIVE mg/dL
Specific Gravity, Urine: 1.008 (ref 1.005–1.030)
pH: 7.5 (ref 5.0–8.0)

## 2013-04-13 LAB — SURGICAL PCR SCREEN
MRSA, PCR: NEGATIVE
Staphylococcus aureus: POSITIVE — AB

## 2013-04-13 LAB — BASIC METABOLIC PANEL
Calcium: 10.3 mg/dL (ref 8.4–10.5)
GFR calc Af Amer: 82 mL/min — ABNORMAL LOW (ref >90)
GFR calc non Af Amer: 71 mL/min — ABNORMAL LOW (ref >90)
Potassium: 3.7 mEq/L (ref 3.5–5.1)
Sodium: 140 mEq/L (ref 135–145)

## 2013-04-13 LAB — TYPE AND SCREEN: ABO/RH(D): A POS

## 2013-04-13 LAB — ABO/RH: ABO/RH(D): A POS

## 2013-04-13 LAB — PROTIME-INR
INR: 0.92 (ref 0.00–1.49)
Prothrombin Time: 12.2 seconds (ref 11.6–15.2)

## 2013-04-13 NOTE — Progress Notes (Addendum)
Saw Dr. Dietrich Pates in 2008, was having chest discomfort--tests were done, and it came up neg.  Never had problem again.  DA  She sees Dr. Assunta Found at [facility]--[phone] --requesting 2008 EKG... DA

## 2013-04-14 NOTE — H&P (Signed)
TOTAL KNEE ADMISSION H&P  Patient is being admitted for right total knee arthroplasty.  Subjective:  Chief Complaint:right knee pain.  HPI: Donna Daniels, 72 y.o. female, has a history of pain and functional disability in the right knee due to arthritis and has failed non-surgical conservative treatments for greater than 12 weeks to includeNSAID's and/or analgesics, corticosteriod injections, viscosupplementation injections, supervised PT with diminished ADL's post treatment, weight reduction as appropriate and activity modification.  Onset of symptoms was gradual, starting 8 years ago with gradually worsening course since that time. The patient noted no past surgery on the right knee(s).  Patient currently rates pain in the right knee(s) at 10 out of 10 with activity. Patient has night pain, worsening of pain with activity and weight bearing, pain that interferes with activities of daily living, pain with passive range of motion and crepitus.  Patient has evidence of subchondral sclerosis, periarticular osteophytes and joint space narrowing by imaging studies. This patient has had no. There is no active infection.  Patient Active Problem List   Diagnosis Date Noted  . CLOSED FRACTURE OF PATELLA 03/06/2010   Past Medical History  Diagnosis Date  . High cholesterol   . Thyroid disorder   . PONV (postoperative nausea and vomiting)   . Hypothyroidism   . Arthritis     Past Surgical History  Procedure Laterality Date  . Cesarean section    . Vesicovaginal fistula closure w/ tah    . Thyroidectomy    . Hand surgery    . Abdominal hysterectomy    . Anterior cervical decomp/discectomy fusion  12/18/2011    Procedure: ANTERIOR CERVICAL DECOMPRESSION/DISCECTOMY FUSION 2 LEVELS;  Surgeon: Reinaldo Meeker, MD;  Location: [facility];  Service: Neurosurgery;  Laterality: N/A;  Anterior cervical decompression fusion four-six    No prescriptions prior to admission   No Known Allergies   History  Substance Use Topics  . Smoking status: Never Smoker   . Smokeless tobacco: Not on file  . Alcohol Use: No    No family history on file.   Review of Systems  Musculoskeletal: Positive for joint pain.  All other systems reviewed and are negative.    Objective:  Physical Exam  Constitutional: She is oriented to person, place, and time. She appears well-nourished.  HENT:  Head: Atraumatic.  Eyes: EOM are normal.  Neck: Neck supple.  Cardiovascular: Normal rate.   Respiratory: Effort normal.  GI: Soft.  Musculoskeletal:  Right knee exam motion 5-95.  Crepitation 1+.  Tracy effusion.  Severe pain medial joint line.  Pain lateral joint line as well with motion.  Neurological: She is oriented to person, place, and time.  Skin: Skin is dry.  Psychiatric: She has a normal mood and affect.    Vital signs in last 24 hours:    Labs:   Estimated body mass index is 24.45 kg/(m^2) as calculated from the following:   Height as of 12/14/11: 5\' 7"  (1.702 m).   Weight as of 12/14/11: 70.818 kg (156 lb 2 oz).   Imaging Review Plain radiographs demonstrate severe degenerative joint disease of the right knee(s). The overall alignment isneutral. The bone quality appears to be good for age and reported activity level.  Assessment/Plan:  End stage arthritis, right knee   The patient history, physical examination, clinical judgment of the provider and imaging studies are consistent with end stage degenerative joint disease of the right knee(s) and total knee arthroplasty is deemed medically necessary. The treatment options  including medical management, injection therapy arthroscopy and arthroplasty were discussed at length. The risks and benefits of total knee arthroplasty were presented and reviewed. The risks due to aseptic loosening, infection, stiffness, patella tracking problems, thromboembolic complications and other imponderables were discussed. The patient acknowledged the  explanation, agreed to proceed with the plan and consent was signed. Patient is being admitted for inpatient treatment for surgery, pain control, PT, OT, prophylactic antibiotics, VTE prophylaxis, progressive ambulation and ADL's and discharge planning. The patient is planning to be discharged home with home health services

## 2013-04-17 MED ORDER — CEFAZOLIN SODIUM-DEXTROSE 2-3 GM-% IV SOLR
2.0000 g | INTRAVENOUS | Status: AC
  Administered 2013-04-18: 2 g via INTRAVENOUS
  Filled 2013-04-17: qty 50

## 2013-04-17 NOTE — Progress Notes (Signed)
Patient instructed to arrive at 1050. Patient verbalized understanding.

## 2013-04-18 ENCOUNTER — Encounter (HOSPITAL_COMMUNITY)
Admission: RE | Disposition: A | Discharge status: Home Health | Payer: Self-pay | Source: Ambulatory Visit | Attending: Orthopaedic Surgery

## 2013-04-18 ENCOUNTER — Encounter (HOSPITAL_COMMUNITY): Payer: Medicare Other | Admitting: Anesthesiology

## 2013-04-18 ENCOUNTER — Inpatient Hospital Stay (HOSPITAL_COMMUNITY)
Admission: RE | Admit: 2013-04-18 | Discharge: 2013-04-20 | DRG: 470 | Disposition: A | Discharge status: Home Health | Payer: Medicare Other | Source: Ambulatory Visit | Attending: Orthopaedic Surgery | Admitting: Orthopaedic Surgery

## 2013-04-18 ENCOUNTER — Inpatient Hospital Stay (HOSPITAL_COMMUNITY): Payer: Medicare Other | Admitting: Anesthesiology

## 2013-04-18 DIAGNOSIS — E039 Hypothyroidism, unspecified: Secondary | ICD-10-CM | POA: Diagnosis not present

## 2013-04-18 DIAGNOSIS — Z79899 Other long term (current) drug therapy: Secondary | ICD-10-CM | POA: Diagnosis not present

## 2013-04-18 DIAGNOSIS — E7800 Pure hypercholesterolemia, unspecified: Secondary | ICD-10-CM | POA: Diagnosis present

## 2013-04-18 DIAGNOSIS — G8918 Other acute postprocedural pain: Secondary | ICD-10-CM | POA: Diagnosis not present

## 2013-04-18 DIAGNOSIS — M25569 Pain in unspecified knee: Secondary | ICD-10-CM | POA: Diagnosis not present

## 2013-04-18 DIAGNOSIS — M1710 Unilateral primary osteoarthritis, unspecified knee: Principal | ICD-10-CM | POA: Diagnosis present

## 2013-04-18 DIAGNOSIS — Z7982 Long term (current) use of aspirin: Secondary | ICD-10-CM | POA: Diagnosis not present

## 2013-04-18 DIAGNOSIS — IMO0002 Reserved for concepts with insufficient information to code with codable children: Secondary | ICD-10-CM | POA: Diagnosis not present

## 2013-04-18 DIAGNOSIS — Z981 Arthrodesis status: Secondary | ICD-10-CM | POA: Diagnosis not present

## 2013-04-18 DIAGNOSIS — M1711 Unilateral primary osteoarthritis, right knee: Secondary | ICD-10-CM | POA: Diagnosis present

## 2013-04-18 HISTORY — PX: TOTAL KNEE ARTHROPLASTY: SHX125

## 2013-04-18 SURGERY — ARTHROPLASTY, KNEE, TOTAL
Anesthesia: Spinal | Site: Knee | Laterality: Right | Wound class: Clean

## 2013-04-18 MED ORDER — POLYETHYLENE GLYCOL 3350 17 G PO PACK: 17.0000 g | PACK | Freq: Every day | ORAL | Status: DC | PRN

## 2013-04-18 MED ORDER — ACETAMINOPHEN 325 MG PO TABS: 650.0000 mg | ORAL_TABLET | Freq: Four times a day (QID) | ORAL | Status: DC | PRN

## 2013-04-18 MED ORDER — LACTATED RINGERS IV SOLN: INTRAVENOUS | Status: DC

## 2013-04-18 MED ORDER — METHOCARBAMOL 500 MG PO TABS
500.0000 mg | ORAL_TABLET | Freq: Four times a day (QID) | ORAL | Status: DC | PRN
  Administered 2013-04-20: 500 mg via ORAL
  Filled 2013-04-18: qty 1

## 2013-04-18 MED ORDER — TRANEXAMIC ACID 100 MG/ML IV SOLN
1000.0000 mg | INTRAVENOUS | Status: AC
  Administered 2013-04-18: 1000 mg via INTRAVENOUS
  Filled 2013-04-18: qty 10

## 2013-04-18 MED ORDER — OXYCODONE HCL 5 MG PO TABS: 5.0000 mg | ORAL_TABLET | Freq: Once | ORAL | Status: DC | PRN

## 2013-04-18 MED ORDER — ASPIRIN EC 325 MG PO TBEC
325.0000 mg | DELAYED_RELEASE_TABLET | Freq: Two times a day (BID) | ORAL | Status: DC
  Administered 2013-04-19 – 2013-04-20 (×3): 325 mg via ORAL
  Filled 2013-04-18 (×5): qty 1

## 2013-04-18 MED ORDER — MENTHOL 3 MG MT LOZG: 1.0000 | LOZENGE | OROMUCOSAL | Status: DC | PRN

## 2013-04-18 MED ORDER — PHENOL 1.4 % MT LIQD: 1.0000 | OROMUCOSAL | Status: DC | PRN

## 2013-04-18 MED ORDER — HYDROMORPHONE HCL PF 1 MG/ML IJ SOLN
0.5000 mg | INTRAMUSCULAR | Status: DC | PRN
  Administered 2013-04-19: 1 mg via INTRAVENOUS
  Filled 2013-04-18: qty 1

## 2013-04-18 MED ORDER — FERROUS SULFATE 325 (65 FE) MG PO TABS
325.0000 mg | ORAL_TABLET | Freq: Every day | ORAL | Status: DC
  Administered 2013-04-19 – 2013-04-20 (×2): 325 mg via ORAL
  Filled 2013-04-18 (×3): qty 1

## 2013-04-18 MED ORDER — FENTANYL CITRATE 0.05 MG/ML IJ SOLN
INTRAMUSCULAR | Status: DC | PRN
  Administered 2013-04-18 (×2): 50 ug via INTRAVENOUS

## 2013-04-18 MED ORDER — CHLORHEXIDINE GLUCONATE 4 % EX LIQD: 60.0000 mL | Freq: Once | CUTANEOUS | Status: DC

## 2013-04-18 MED ORDER — PROMETHAZINE HCL 25 MG/ML IJ SOLN: 6.2500 mg | INTRAMUSCULAR | Status: DC | PRN

## 2013-04-18 MED ORDER — METOCLOPRAMIDE HCL 5 MG PO TABS
5.0000 mg | ORAL_TABLET | Freq: Three times a day (TID) | ORAL | Status: DC | PRN
  Filled 2013-04-18: qty 2

## 2013-04-18 MED ORDER — SIMVASTATIN 40 MG PO TABS
40.0000 mg | ORAL_TABLET | Freq: Every evening | ORAL | Status: DC
  Administered 2013-04-18 – 2013-04-19 (×2): 40 mg via ORAL
  Filled 2013-04-18 (×3): qty 1

## 2013-04-18 MED ORDER — BUPIVACAINE LIPOSOME 1.3 % IJ SUSP
20.0000 mL | INTRAMUSCULAR | Status: DC
  Filled 2013-04-18: qty 20

## 2013-04-18 MED ORDER — ONDANSETRON HCL 4 MG/2ML IJ SOLN: 4.0000 mg | Freq: Four times a day (QID) | INTRAMUSCULAR | Status: DC | PRN

## 2013-04-18 MED ORDER — LEVOTHYROXINE SODIUM 100 MCG PO TABS: 100.0000 ug | ORAL_TABLET | Freq: Every day | ORAL | Status: DC

## 2013-04-18 MED ORDER — MIDAZOLAM HCL 2 MG/2ML IJ SOLN
INTRAMUSCULAR | Status: AC
  Administered 2013-04-18: 1 mg
  Filled 2013-04-18: qty 2

## 2013-04-18 MED ORDER — SCOPOLAMINE 1 MG/3DAYS TD PT72
1.0000 | MEDICATED_PATCH | TRANSDERMAL | Status: DC
  Administered 2013-04-18: 1.5 mg via TRANSDERMAL
  Filled 2013-04-18: qty 1

## 2013-04-18 MED ORDER — METOCLOPRAMIDE HCL 5 MG/ML IJ SOLN: 5.0000 mg | Freq: Three times a day (TID) | INTRAMUSCULAR | Status: DC | PRN

## 2013-04-18 MED ORDER — FLEET ENEMA 7-19 GM/118ML RE ENEM: 1.0000 | ENEMA | Freq: Once | RECTAL | Status: AC | PRN

## 2013-04-18 MED ORDER — SODIUM CHLORIDE 0.9 % IJ SOLN
INTRAMUSCULAR | Status: DC | PRN
  Administered 2013-04-18: 14:00:00

## 2013-04-18 MED ORDER — OXYCODONE HCL 5 MG/5ML PO SOLN: 5.0000 mg | Freq: Once | ORAL | Status: DC | PRN

## 2013-04-18 MED ORDER — ONDANSETRON HCL 4 MG/2ML IJ SOLN
INTRAMUSCULAR | Status: DC | PRN
  Administered 2013-04-18: 4 mg via INTRAVENOUS

## 2013-04-18 MED ORDER — ONDANSETRON HCL 4 MG PO TABS: 4.0000 mg | ORAL_TABLET | Freq: Four times a day (QID) | ORAL | Status: DC | PRN

## 2013-04-18 MED ORDER — HYDROCODONE-ACETAMINOPHEN 5-325 MG PO TABS
1.0000 | ORAL_TABLET | ORAL | Status: DC | PRN
  Administered 2013-04-19 – 2013-04-20 (×4): 2 via ORAL
  Filled 2013-04-18 (×4): qty 2

## 2013-04-18 MED ORDER — CEFAZOLIN SODIUM-DEXTROSE 2-3 GM-% IV SOLR
2.0000 g | Freq: Four times a day (QID) | INTRAVENOUS | Status: AC
  Administered 2013-04-18 – 2013-04-19 (×2): 2 g via INTRAVENOUS
  Filled 2013-04-18 (×2): qty 50

## 2013-04-18 MED ORDER — LACTATED RINGERS IV SOLN
INTRAVENOUS | Status: DC
  Administered 2013-04-18 (×2): via INTRAVENOUS

## 2013-04-18 MED ORDER — ACETAMINOPHEN 650 MG RE SUPP: 650.0000 mg | Freq: Four times a day (QID) | RECTAL | Status: DC | PRN

## 2013-04-18 MED ORDER — DIPHENHYDRAMINE HCL 12.5 MG/5ML PO ELIX: 12.5000 mg | ORAL_SOLUTION | ORAL | Status: DC | PRN

## 2013-04-18 MED ORDER — OMEGA-3-ACID ETHYL ESTERS 1 G PO CAPS
1.0000 g | ORAL_CAPSULE | Freq: Three times a day (TID) | ORAL | Status: DC
  Administered 2013-04-18 – 2013-04-20 (×5): 1 g via ORAL
  Filled 2013-04-18 (×7): qty 1

## 2013-04-18 MED ORDER — SODIUM CHLORIDE 0.9 % IR SOLN
Status: DC | PRN
  Administered 2013-04-18: 3000 mL

## 2013-04-18 MED ORDER — METHOCARBAMOL 100 MG/ML IJ SOLN
500.0000 mg | Freq: Four times a day (QID) | INTRAVENOUS | Status: DC | PRN
  Filled 2013-04-18: qty 5

## 2013-04-18 MED ORDER — FENTANYL CITRATE 0.05 MG/ML IJ SOLN
INTRAMUSCULAR | Status: AC
  Administered 2013-04-18: 50 ug
  Filled 2013-04-18: qty 2

## 2013-04-18 MED ORDER — LEVOTHYROXINE SODIUM 100 MCG PO TABS
100.0000 ug | ORAL_TABLET | Freq: Every day | ORAL | Status: DC
  Administered 2013-04-19 – 2013-04-20 (×2): 100 ug via ORAL
  Filled 2013-04-18 (×3): qty 1

## 2013-04-18 MED ORDER — BUPIVACAINE IN DEXTROSE 0.75-8.25 % IT SOLN
INTRATHECAL | Status: DC | PRN
  Administered 2013-04-18: 12 mg via INTRATHECAL

## 2013-04-18 MED ORDER — PROPOFOL 10 MG/ML IV BOLUS
INTRAVENOUS | Status: DC | PRN
  Administered 2013-04-18 (×2): 20 mg via INTRAVENOUS

## 2013-04-18 MED ORDER — PROPOFOL INFUSION 10 MG/ML OPTIME
INTRAVENOUS | Status: DC | PRN
  Administered 2013-04-18: 25 ug/kg/min via INTRAVENOUS

## 2013-04-18 MED ORDER — MIDAZOLAM HCL 5 MG/5ML IJ SOLN
INTRAMUSCULAR | Status: DC | PRN
  Administered 2013-04-18: 2 mg via INTRAVENOUS

## 2013-04-18 MED ORDER — LACTATED RINGERS IV SOLN
INTRAVENOUS | Status: DC
  Administered 2013-04-19: 06:00:00 via INTRAVENOUS

## 2013-04-18 MED ORDER — HYDROMORPHONE HCL PF 1 MG/ML IJ SOLN: 0.2500 mg | INTRAMUSCULAR | Status: DC | PRN

## 2013-04-18 MED ORDER — DOCUSATE SODIUM 100 MG PO CAPS
100.0000 mg | ORAL_CAPSULE | Freq: Two times a day (BID) | ORAL | Status: DC
  Administered 2013-04-18 – 2013-04-19 (×3): 100 mg via ORAL
  Filled 2013-04-18 (×4): qty 1

## 2013-04-18 SURGICAL SUPPLY — 69 items
APL SKNCLS STERI-STRIP NONHPOA (GAUZE/BANDAGES/DRESSINGS) ×1
BANDAGE ELASTIC 4 VELCRO ST LF (GAUZE/BANDAGES/DRESSINGS) ×2 IMPLANT
BANDAGE ELASTIC 6 VELCRO ST LF (GAUZE/BANDAGES/DRESSINGS) ×1 IMPLANT
BANDAGE ESMARK 6X9 LF (GAUZE/BANDAGES/DRESSINGS) ×1 IMPLANT
BANDAGE GAUZE ELAST BULKY 4 IN (GAUZE/BANDAGES/DRESSINGS) ×3 IMPLANT
BENZOIN TINCTURE PRP APPL 2/3 (GAUZE/BANDAGES/DRESSINGS) ×1 IMPLANT
BLADE SAGITTAL 25.0X1.19X90 (BLADE) ×2 IMPLANT
BLADE SURG ROTATE 9660 (MISCELLANEOUS) IMPLANT
BNDG CMPR 9X6 STRL LF SNTH (GAUZE/BANDAGES/DRESSINGS) ×1
BNDG CMPR MED 10X6 ELC LF (GAUZE/BANDAGES/DRESSINGS) ×1
BNDG ELASTIC 6X10 VLCR STRL LF (GAUZE/BANDAGES/DRESSINGS) ×2 IMPLANT
BNDG ESMARK 6X9 LF (GAUZE/BANDAGES/DRESSINGS) ×2
BOWL SMART MIX CTS (DISPOSABLE) ×2 IMPLANT
CAPT RP KNEE ×1 IMPLANT
CEMENT HV SMART SET (Cement) ×3 IMPLANT
CLOSURE STERI-STRIP 1/4X4 (GAUZE/BANDAGES/DRESSINGS) ×1 IMPLANT
CLOTH BEACON ORANGE TIMEOUT ST (SAFETY) ×2 IMPLANT
COVER SURGICAL LIGHT HANDLE (MISCELLANEOUS) ×2 IMPLANT
CUFF TOURNIQUET SINGLE 34IN LL (TOURNIQUET CUFF) ×2 IMPLANT
CUFF TOURNIQUET SINGLE 44IN (TOURNIQUET CUFF) IMPLANT
DRAPE EXTREMITY T 121X128X90 (DRAPE) ×2 IMPLANT
DRAPE PROXIMA HALF (DRAPES) ×2 IMPLANT
DRAPE U-SHAPE 47X51 STRL (DRAPES) ×2 IMPLANT
DRSG ADAPTIC 3X8 NADH LF (GAUZE/BANDAGES/DRESSINGS) ×2 IMPLANT
DRSG PAD ABDOMINAL 8X10 ST (GAUZE/BANDAGES/DRESSINGS) ×2 IMPLANT
DURAPREP 26ML APPLICATOR (WOUND CARE) ×2 IMPLANT
ELECT REM PT RETURN 9FT ADLT (ELECTROSURGICAL) ×2
ELECTRODE REM PT RTRN 9FT ADLT (ELECTROSURGICAL) ×1 IMPLANT
FACESHIELD LNG OPTICON STERILE (SAFETY) ×4 IMPLANT
GLOVE BIO SURGEON STRL SZ8.5 (GLOVE) ×2 IMPLANT
GLOVE BIOGEL PI IND STRL 8 (GLOVE) ×1 IMPLANT
GLOVE BIOGEL PI IND STRL 8.5 (GLOVE) ×1 IMPLANT
GLOVE BIOGEL PI INDICATOR 8 (GLOVE) ×1
GLOVE BIOGEL PI INDICATOR 8.5 (GLOVE) ×1
GLOVE SS BIOGEL STRL SZ 8 (GLOVE) ×1 IMPLANT
GLOVE SUPERSENSE BIOGEL SZ 8 (GLOVE) ×1
GOWN PREVENTION PLUS XLARGE (GOWN DISPOSABLE) ×2 IMPLANT
GOWN PREVENTION PLUS XXLARGE (GOWN DISPOSABLE) ×2 IMPLANT
GOWN STRL NON-REIN LRG LVL3 (GOWN DISPOSABLE) ×2 IMPLANT
HANDPIECE INTERPULSE COAX TIP (DISPOSABLE) ×2
HOOD PEEL AWAY FACE SHEILD DIS (HOOD) ×2 IMPLANT
IMMOBILIZER KNEE 20 (SOFTGOODS)
IMMOBILIZER KNEE 20 THIGH 36 (SOFTGOODS) IMPLANT
IMMOBILIZER KNEE 22 UNIV (SOFTGOODS) ×2 IMPLANT
IMMOBILIZER KNEE 24 THIGH 36 (MISCELLANEOUS) IMPLANT
IMMOBILIZER KNEE 24 UNIV (MISCELLANEOUS)
KIT BASIN OR (CUSTOM PROCEDURE TRAY) ×2 IMPLANT
KIT ROOM TURNOVER OR (KITS) ×2 IMPLANT
MANIFOLD NEPTUNE II (INSTRUMENTS) ×2 IMPLANT
NDL HYPO 21X1 ECLIPSE (NEEDLE) ×1 IMPLANT
NEEDLE HYPO 21X1 ECLIPSE (NEEDLE) ×2 IMPLANT
NS IRRIG 1000ML POUR BTL (IV SOLUTION) ×2 IMPLANT
PACK TOTAL JOINT (CUSTOM PROCEDURE TRAY) ×2 IMPLANT
PAD ARMBOARD 7.5X6 YLW CONV (MISCELLANEOUS) ×4 IMPLANT
SET HNDPC FAN SPRY TIP SCT (DISPOSABLE) ×1 IMPLANT
SPONGE GAUZE 4X4 12PLY (GAUZE/BANDAGES/DRESSINGS) ×2 IMPLANT
STAPLER VISISTAT 35W (STAPLE) IMPLANT
SUCTION FRAZIER TIP 10 FR DISP (SUCTIONS) IMPLANT
SUT MNCRL AB 3-0 PS2 18 (SUTURE) IMPLANT
SUT VIC AB 0 CT1 27 (SUTURE) ×4
SUT VIC AB 0 CT1 27XBRD ANBCTR (SUTURE) ×2 IMPLANT
SUT VIC AB 2-0 CT1 27 (SUTURE) ×4
SUT VIC AB 2-0 CT1 TAPERPNT 27 (SUTURE) ×2 IMPLANT
SUT VLOC 180 0 24IN GS25 (SUTURE) ×2 IMPLANT
SYR 50ML LL SCALE MARK (SYRINGE) ×2 IMPLANT
TOWEL OR 17X24 6PK STRL BLUE (TOWEL DISPOSABLE) ×2 IMPLANT
TOWEL OR 17X26 10 PK STRL BLUE (TOWEL DISPOSABLE) ×2 IMPLANT
TRAY FOLEY CATH 14FR (SET/KITS/TRAYS/PACK) ×2 IMPLANT
WATER STERILE IRR 1000ML POUR (IV SOLUTION) ×4 IMPLANT

## 2013-04-18 NOTE — Progress Notes (Signed)
Orthopedic Tech Progress Note Patient Details:  Donna Daniels 12/16/1940 [id number] Applied overhead frame  CPM Right Knee CPM Right Knee: On Right Knee Flexion (Degrees): 60 Right Knee Extension (Degrees): 0   Donna Daniels 04/18/2013, 4:13 PM

## 2013-04-18 NOTE — Anesthesia Procedure Notes (Addendum)
Anesthesia Regional Block:  Femoral nerve block  Pre-Anesthetic Checklist: ,, timeout performed, Correct Patient, Correct Site, Correct Laterality, Correct Procedure, Correct Position, site marked, Risks and benefits discussed,  Surgical consent,  Pre-op evaluation,  At surgeon's request and post-op pain management  Laterality: Right  Prep: chloraprep       Needles:  Injection technique: Single-shot  Needle Type: Echogenic Stimulator Needle     Needle Length: 5cm 5 cm Needle Gauge: 22 and 22 G    Additional Needles:  Procedures: ultrasound guided (picture in chart) and nerve stimulator Femoral nerve block  Nerve Stimulator or Paresthesia:  Response: quadraceps contraction, 0.45 mA,   Additional Responses:   Narrative:  Start time: 04/18/2013 12:20 PM End time: 04/18/2013 12:30 PM Injection made incrementally with aspirations every 5 mL.  Performed by: Personally  Anesthesiologist: Halford Decamp, MD  Additional Notes: Functioning IV was confirmed and monitors were applied.  A 50mm 22ga Arrow echogenic stimulator needle was used. Sterile prep and drape,hand hygiene and sterile gloves were used. Ultrasound guidance: relevant anatomy identified, needle position confirmed, local anesthetic spread visualized around nerve(s)., vascular puncture avoided.  Image printed for medical record. Negative aspiration and negative test dose prior to incremental administration of local anesthetic. The patient tolerated the procedure well.    Femoral nerve block Procedure Name: MAC Date/Time: 04/18/2013 1:28 PM Performed by: Marena Chancy Pre-anesthesia Checklist: Patient identified, Patient being monitored, Emergency Drugs available, Timeout performed and Suction available Patient Re-evaluated:Patient Re-evaluated prior to inductionOxygen Delivery Method: Nasal cannula Preoxygenation: Pre-oxygenation with 100% oxygen    Anesthesia Regional Block:   Narrative:     Spinal  Patient location during procedure: OR Start time: 04/18/2013 1:20 PM End time: 04/18/2013 1:30 PM Staffing Anesthesiologist: Remonia Richter Performed by: anesthesiologist  Preanesthetic Checklist Completed: patient identified, site marked, surgical consent, pre-op evaluation, timeout performed, IV checked, risks and benefits discussed and monitors and equipment checked Spinal Block Patient position: sitting Prep: Betadine Patient monitoring: heart rate, cardiac monitor, continuous pulse ox and blood pressure Approach: midline Location: L3-4 Injection technique: single-shot Needle Needle type: Quincke  Needle gauge: 25 G Needle length: 9 cm Needle insertion depth: 6 cm

## 2013-04-18 NOTE — Op Note (Signed)
PREOP DIAGNOSIS: DJD RIGHT KNEE POSTOP DIAGNOSIS: same PROCEDURE: RIGHT TKR ANESTHESIA: Spinal and block ATTENDING SURGEON: Lanissa Cashen G ASSISTANT: Lindwood Qua PA and Patrick Jupiter RNFA  INDICATIONS FOR PROCEDURE: [name] is a 72 y.o. female who has struggled for a long time with pain due to degenerative arthritis of the right knee.  The patient has failed many conservative non-operative measures and at this point has pain which limits the ability to sleep and walk.  The patient is offered total knee replacement.  Informed operative consent was obtained after discussion of possible risks of anesthesia, infection, neurovascular injury, DVT, and death.  The importance of the post-operative rehabilitation protocol to optimize result was stressed extensively with the patient.  SUMMARY OF FINDINGS AND PROCEDURE:  Donna Daniels was taken to the operative suite where under the above anesthesia a right knee replacement was performed.  There were advanced degenerative changes and the bone quality was good.  We used the DePuy system and placed size standard femur, 3 tibia, 35 mm all polyethylene patella, and a size 10 mm spacer.  The patient was admitted for appropriate post-op care to include perioperative antibiotics and mechanical and pharmacologic measures for DVT prophylaxis.  DESCRIPTION OF PROCEDURE:  Donna Daniels was taken to the operative suite where the above anesthesia was applied.  The patient was positioned supine and prepped and draped in normal sterile fashion.  An appropriate time out was performed.  After the administration of Kefzol pre-op antibiotic the leg was elevated and exsanguinated and a tourniquet inflated. A standard longitudinal incision was made on the anterior knee.  Dissection was carried down to the extensor mechanism.  All appropriate anti-infective measures were used including the pre-operative antibiotic, betadine impregnated drape, and closed hooded  exhaust systems for each member of the surgical team.  A medial parapatellar incision was made in the extensor mechanism and the knee cap flipped and the knee flexed.  Some residual meniscal tissues were removed along with any remaining ACL/PCL tissue.  A guide was placed on the tibia and a flat cut was made on it's superior surface.  An intramedullary guide was placed in the femur and was utilized to make anterior and posterior cuts creating an appropriate flexion gap.  A second intramedullary guide was placed in the femur to make a distal cut properly balancing the knee with an extension gap equal to the flexion gap.  The three bones sized to the above mentioned sizes and the appropriate guides were placed and utilized.  A trial reduction was done and the knee easily came to full extension and the patella tracked well on flexion.  The trial components were removed and all bones were cleaned with pulsatile lavage and then dried thoroughly.  Cement was mixed and was pressurized onto the bones followed by placement of the aforementioned components.  Excess cement was trimmed and pressure was held on the components until the cement had hardened.  The tourniquet was deflated and a small amount of bleeding was controlled with cautery and pressure.  The knee was irrigated thoroughly.  The extensor mechanism was re-approximated with V-loc suture in running fashion.  The knee was flexed and the repair was solid.  The subcutaneous tissues were re-approximated with #0 and #2-0 vicryl and the skin closed with a subcuticular stitch and steristrips.  We also injected with Exparel into subQ and capsule at end of case.  A sterile dressing was applied.  Intraoperative fluids, EBL, and tourniquet time can be  obtained from anesthesia records.  DISPOSITION:  The patient was taken to recovery room in stable condition and admitted for appropriate post-op care to include peri-operative antibiotic and DVT prophylaxis with mechanical  and pharmacologic measures.  Juanita Streight G 04/18/2013, 2:57 PM

## 2013-04-18 NOTE — Interval H&P Note (Signed)
History and Physical Interval Note:  04/18/2013 12:23 PM  Donna Daniels  has presented today for surgery, with the diagnosis of RIGHT KNEE DEGENERATIVE JOINT DISEASE  The various methods of treatment have been discussed with the patient and family. After consideration of risks, benefits and other options for treatment, the patient has consented to  Procedure(s): TOTAL KNEE ARTHROPLASTY (Right) as a surgical intervention .  The patient's history has been reviewed, patient examined, no change in status, stable for surgery.  I have reviewed the patient's chart and labs.  Questions were answered to the patient's satisfaction.     Carolyne Whitsel G

## 2013-04-18 NOTE — Anesthesia Preprocedure Evaluation (Addendum)
Anesthesia Evaluation    Reviewed: Allergy & Precautions, H&P , NPO status , Patient's Chart, lab work & pertinent test results  History of Anesthesia Complications (+) PONV and history of anesthetic complications  Airway Mallampati: II TM Distance: >3 FB Neck ROM: Full    Dental  (+) Edentulous Upper and Dental Advisory Given   Pulmonary neg pulmonary ROS,    Pulmonary exam normal       Cardiovascular negative cardio ROS      Neuro/Psych negative neurological ROS  negative psych ROS   GI/Hepatic negative GI ROS, Neg liver ROS,   Endo/Other  Hypothyroidism   Renal/GU negative Renal ROS     Musculoskeletal   Abdominal   Peds  Hematology   Anesthesia Other Findings   Reproductive/Obstetrics                          Anesthesia Physical Anesthesia Plan  ASA: II  Anesthesia Plan: Spinal   Post-op Pain Management:    Induction:   Airway Management Planned: Simple Face Mask  Additional Equipment:   Intra-op Plan:   Post-operative Plan:   Informed Consent: I have reviewed the patients History and Physical, chart, labs and discussed the procedure including the risks, benefits and alternatives for the proposed anesthesia with the patient or authorized representative who has indicated his/her understanding and acceptance.   Dental advisory given  Plan Discussed with: CRNA, Anesthesiologist and Surgeon  Anesthesia Plan Comments:        Anesthesia Quick Evaluation

## 2013-04-18 NOTE — Preoperative (Signed)
Beta Blockers   Reason not to administer Beta Blockers:Not Applicable 

## 2013-04-18 NOTE — Anesthesia Postprocedure Evaluation (Signed)
Anesthesia Post Note  Patient: Donna Daniels  Procedure(s) Performed: Procedure(s) (LRB): TOTAL KNEE ARTHROPLASTY (Right)  Anesthesia type: SAB  Patient location: PACU  Post pain: Pain level controlled  Post assessment: Patient's Cardiovascular Status Stable  Last Vitals:  Filed Vitals:   04/18/13 1623  BP: 139/68  Pulse: 65  Temp: 36.3 C  Resp: 18    Post vital signs: Reviewed and stable  Level of consciousness: sedated  Complications: No apparent anesthesia complications

## 2013-04-18 NOTE — Transfer of Care (Signed)
Immediate Anesthesia Transfer of Care Note  Patient: Donna Daniels  Procedure(s) Performed: Procedure(s): TOTAL KNEE ARTHROPLASTY (Right)  Patient Location: PACU  Anesthesia Type:Spinal and FNB  Level of Consciousness: awake, alert  and oriented  Airway & Oxygen Therapy: Patient Spontanous Breathing and Patient connected to nasal cannula oxygen  Post-op Assessment: Report given to PACU RN, Post -op Vital signs reviewed and stable and Patient moving all extremities X 4  Post vital signs: Reviewed and stable  Complications: No apparent anesthesia complications

## 2013-04-18 NOTE — Plan of Care (Signed)
Problem: Consults Goal: Diagnosis- Total Joint Replacement Primary Total Knee Right     

## 2013-04-19 LAB — CBC
HCT: 33.5 % — ABNORMAL LOW (ref 36.0–46.0)
MCHC: 33.1 g/dL (ref 30.0–36.0)
MCV: 91.3 fL (ref 78.0–100.0)
RBC: 3.67 MIL/uL — ABNORMAL LOW (ref 3.87–5.11)
RDW: 13.4 % (ref 11.5–15.5)

## 2013-04-19 LAB — BASIC METABOLIC PANEL
BUN: 12 mg/dL (ref 6–23)
Creatinine, Ser: 0.77 mg/dL (ref 0.50–1.10)
GFR calc Af Amer: >90 mL/min (ref >90)
GFR calc non Af Amer: 82 mL/min — ABNORMAL LOW (ref >90)
Glucose, Bld: 142 mg/dL — ABNORMAL HIGH (ref 70–99)

## 2013-04-19 NOTE — Evaluation (Signed)
Occupational Therapy Evaluation Patient Details Name: LORAINA STAUFFER MRN: [id number] DOB: [birth year] Today's Date: 04/19/2013 Time: 4098-1191 OT Time Calculation (min): 22 min  OT Assessment / Plan / Recommendation History of present illness Pt is a 72 y/o female admitted s/p R TKA.   Clinical Impression   Pt with impaired mobility and standing following TKA. Educated pt and daughter in use of adaptive equipment for LB ADL. Pt plans to rely on her daughter as she will be staying with her upon discharge initially.   No further OT needs.  OT Assessment  Patient does not need any further OT services    Follow Up Recommendations  No OT follow up;Supervision/Assistance - 24 hour    Barriers to Discharge      Equipment Recommendations  None recommended by OT    Recommendations for Other Services    Frequency       Precautions / Restrictions Precautions Precautions: Fall;Knee Required Braces or Orthoses: Knee Immobilizer - Right Knee Immobilizer - Right: On when out of bed or walking Restrictions Weight Bearing Restrictions: Yes RLE Weight Bearing: Weight bearing as tolerated   Pertinent Vitals/Pain VSS, no c/o pain, nerve block intact    ADL  Eating/Feeding: Independent Where Assessed - Eating/Feeding: Bed level Grooming: Wash/dry hands;Set up Where Assessed - Grooming: Unsupported sitting Upper Body Bathing: Set up Where Assessed - Upper Body Bathing: Unsupported sitting Lower Body Bathing: Minimal assistance Where Assessed - Lower Body Bathing: Unsupported sitting;Supported sit to stand Upper Body Dressing: Set up Where Assessed - Upper Body Dressing: Unsupported sitting Lower Body Dressing: Minimal assistance Where Assessed - Lower Body Dressing: Unsupported sitting;Supported sit to stand Toilet Transfer: Min Pension scheme manager Method: Surveyor, minerals: Materials engineer and Hygiene: Supervision/safety Where  Assessed - Engineer, mining and Hygiene: Sit on 3-in-1 or toilet Equipment Used: Knee Immobilizer;Gait belt;Reacher;Long-handled sponge;Long-handled shoe horn;Rolling walker;Sock aid Transfers/Ambulation Related to ADLs: min guard assist, pt with nerve block intact, did not ambulate pt ADL Comments: Educated in use of AE for LB ADL.  Pt will rely on her daughter to assist until she is able.    OT Diagnosis:    OT Problem List:   OT Treatment Interventions:     OT Goals(Current goals can be found in the care plan section) Acute Rehab OT Goals Patient Stated Goal: To return home with daughter  Visit Information  Last OT Received On: 04/19/13 Assistance Needed: +1 History of Present Illness: Pt is a 72 y/o female admitted s/p R TKA.       Prior Functioning     Home Living Family/patient expects to be discharged to:: Private residence Living Arrangements: Children Available Help at Discharge: Family;Available 24 hours/day Type of Home: House Home Access: Stairs to enter [employer] of Steps: 2 Entrance Stairs-Rails: None Home Layout: Able to live on main level with bedroom/bathroom Home Equipment: Bedside commode;Walker - 2 wheels;Toilet riser;Shower seat Additional Comments: Pt plans to go to daughter's home upon d/c which has 2 levels, but pt plans to stay on the first floor with a half bath and will go to her home to shower in her walk in shower. Prior Function Level of Independence: Independent Communication Communication: No difficulties Dominant Hand: Right         Vision/Perception Vision - History Baseline Vision: Wears glasses all the time Patient Visual Report: No change from baseline   Cognition  Cognition Arousal/Alertness: Awake/alert Behavior During Therapy: WFL for tasks assessed/performed Overall  Cognitive Status: Within Functional Limits for tasks assessed    Extremity/Trunk Assessment Upper Extremity Assessment Upper  Extremity Assessment: Overall WFL for tasks assessed (arthritic deformities in PIPs and DIPs) Lower Extremity Assessment Lower Extremity Assessment: Defer to PT evaluation RLE Deficits / Details: Decreased strength and AROM consistent with R TKA Cervical / Trunk Assessment Cervical / Trunk Assessment: Normal     Mobility Bed Mobility Bed Mobility: Supine to Sit;Sit to Supine Supine to Sit: 4: Min assist Sit to Supine: 4: Min assist Details for Bed Mobility Assistance: assisted for R LE Transfers Transfers: Sit to Stand;Stand to Sit Sit to Stand: 4: Min guard;With upper extremity assist Stand to Sit: 4: Min guard;With upper extremity assist Details for Transfer Assistance: VC's for hand placement on seated surface prior to initiating transfers. Cues also for R LE placement in extension for comfort when stand>sit.      Exercise    Balance Balance Balance Assessed: Yes Static Sitting Balance Static Sitting - Balance Support: Feet supported;No upper extremity supported Static Sitting - Level of Assistance: 6: Modified independent (Device/Increase time) Static Standing Balance Static Standing - Balance Support: No upper extremity supported Static Standing - Level of Assistance: 4: Min assist    End of Session OT - End of Session Activity Tolerance: Patient tolerated treatment well Patient left: with call bell/phone within reach;with family/visitor present;in bed   GO     Evern Bio 04/19/2013, 1:30 PM [phone]

## 2013-04-19 NOTE — Evaluation (Signed)
Physical Therapy Evaluation Patient Details Name: Donna Daniels MRN: [id number] DOB: [birth year] Today's Date: 04/19/2013 Time: 1000-1025 PT Time Calculation (min): 25 min  PT Assessment / Plan / Recommendation History of Present Illness  Pt is a 72 y/o female admitted s/p R TKA.  Clinical Impression  Pt presents with acute pain and decreased independence with functional mobility following R TKA. At the time of PT eval, pt was able to perform transfers and ambulation with min guard and occasional min assist for balance or walker placement. Bed mobility not assessed as pt was received sitting in recliner. This patient is appropriate for skilled PT interventions to address functional limitations, improve safety and independence with functional mobility, and return to PLOF.    PT Assessment  Patient needs continued PT services    Follow Up Recommendations  Home health PT    Does the patient have the potential to tolerate intense rehabilitation      Barriers to Discharge        Equipment Recommendations  None recommended by PT    Recommendations for Other Services     Frequency 7X/week    Precautions / Restrictions Precautions Precautions: Fall;Knee Required Braces or Orthoses: Knee Immobilizer - Right Knee Immobilizer - Right: On when out of bed or walking Restrictions Weight Bearing Restrictions: Yes RLE Weight Bearing: Weight bearing as tolerated   Pertinent Vitals/Pain Pt reports pain only with bending of knee that decreases with rest. Not rated on 0-10 scale.      Mobility  Bed Mobility Bed Mobility: Not assessed Details for Bed Mobility Assistance: Pt received up in chair Transfers Transfers: Sit to Stand;Stand to Sit Sit to Stand: 4: Min guard;From chair/3-in-1;With upper extremity assist Stand to Sit: 4: Min guard;To chair/3-in-1;With upper extremity assist Details for Transfer Assistance: VC's for hand placement on seated surface prior to initiating  transfers. Cues also for R LE placement in extension for comfort when stand>sit.  Ambulation/Gait Ambulation/Gait Assistance: 4: Min assist Ambulation Distance (Feet): 75 Feet Assistive device: Rolling walker Ambulation/Gait Assistance Details: Occasional min assist for walker placement further away from trunk, but overall min guard appropriate for this patient. VC's for sequencing, increased heel strike, and decreased step length on R. Gait Pattern: Step-to pattern;Decreased stride length Gait velocity: decreased    Exercises Total Joint Exercises Ankle Circles/Pumps: 10 reps Quad Sets: 10 reps Heel Slides: 10 reps Goniometric ROM: 9-100 AROM   PT Diagnosis: Difficulty walking;Acute pain  PT Problem List: Decreased strength;Decreased range of motion;Decreased activity tolerance;Decreased balance;Decreased mobility;Decreased safety awareness;Decreased knowledge of use of DME;Pain PT Treatment Interventions: DME instruction;Gait training;Stair training;Functional mobility training;Therapeutic activities;Therapeutic exercise;Neuromuscular re-education;Patient/family education     PT Goals(Current goals can be found in the care plan section) Acute Rehab PT Goals Patient Stated Goal: To return hom with daughter PT Goal Formulation: With patient Time For Goal Achievement: 04/26/13 Potential to Achieve Goals: Good  Visit Information  Last PT Received On: 04/19/13 Assistance Needed: +1 History of Present Illness: Pt is a 72 y/o female admitted s/p R TKA.       Prior Functioning  Home Living Family/patient expects to be discharged to:: Private residence Living Arrangements: Children (Daughter) Available Help at Discharge: Family;Available 24 hours/day Type of Home: House Home Access: Stairs to enter [employer] of Steps: 2 Entrance Stairs-Rails: None Home Layout: One level Home Equipment: Bedside commode;Walker - 2 wheels;Toilet riser Prior Function Level of  Independence: Independent Communication Communication: No difficulties Dominant Hand: Right    Cognition  Cognition  Arousal/Alertness: Awake/alert Behavior During Therapy: WFL for tasks assessed/performed Overall Cognitive Status: Within Functional Limits for tasks assessed    Extremity/Trunk Assessment Upper Extremity Assessment Upper Extremity Assessment: Overall WFL for tasks assessed Lower Extremity Assessment Lower Extremity Assessment: RLE deficits/detail RLE Deficits / Details: Decreased strength and AROM consistent with R TKA Cervical / Trunk Assessment Cervical / Trunk Assessment: Normal   Balance Balance Balance Assessed: Yes Static Sitting Balance Static Sitting - Balance Support: Feet supported;No upper extremity supported Static Sitting - Level of Assistance: 6: Modified independent (Device/Increase time) Static Sitting - Comment/# of Minutes: 8 minutes while dressing and providing history for therapist Static Standing Balance Static Standing - Balance Support: No upper extremity supported Static Standing - Level of Assistance: 4: Min assist Static Standing - Comment/# of Minutes: 1  End of Session PT - End of Session Equipment Utilized During Treatment: Gait belt Activity Tolerance: Patient tolerated treatment well Patient left: in chair;with call bell/phone within reach CPM Right Knee CPM Right Knee: Off  GP     Ruthann Cancer 04/19/2013, 10:42 AM  Ruthann Cancer, PT, DPT Acute Rehabilitation Services [phone]

## 2013-04-19 NOTE — Progress Notes (Signed)
Subjective: 1 Day Post-Op Procedure(s) (LRB): TOTAL KNEE ARTHROPLASTY (Right)  Activity level:  Weightbearing as tolerated right Diet tolerance:  Regular diet Voiding:  Catheter coming out Patient reports pain as 2 on 0-10 scale.    Objective: Vital signs in last 24 hours: Temp:  [93 F (33.9 C)-98.3 F (36.8 C)] 98.3 F (36.8 C) (10/22 0758) Pulse Rate:  [65-88] 82 (10/22 0758) Resp:  [15-20] 20 (10/22 0758) BP: (107-152)/(53-132) 147/132 mmHg (10/22 0758) SpO2:  [94 %-100 %] 94 % (10/22 0758)  Labs:  Recent Labs  04/19/13 0515  HGB 11.1*    Recent Labs  04/19/13 0515  WBC 14.7*  RBC 3.67*  HCT 33.5*  PLT 220    Recent Labs  04/19/13 0515  NA 141  K 4.0  CL 105  CO2 26  BUN 12  CREATININE 0.77  GLUCOSE 142*  CALCIUM 9.1   No results found for this basename: LABPT, INR,  in the last 72 hours  Physical Exam:  Neurologically intact ABD soft Neurovascular intact Sensation intact distally Intact pulses distally Dorsiflexion/Plantar flexion intact No cellulitis present Compartment soft  Assessment/Plan:  1 Day Post-Op Procedure(s) (LRB): TOTAL KNEE ARTHROPLASTY (Right) Advance diet Up with therapy D/C IV fluids Plan for discharge tomorrow with home health. Continue ASA 325 twice a day/SCDs. May be weightbearing as tolerated with therapy out of bed.    Donna Daniels R 04/19/2013, 8:37 AM

## 2013-04-20 ENCOUNTER — Encounter (HOSPITAL_COMMUNITY): Payer: Self-pay | Admitting: Orthopaedic Surgery

## 2013-04-20 LAB — CBC
HCT: 33.9 % — ABNORMAL LOW (ref 36.0–46.0)
MCH: 30.3 pg (ref 26.0–34.0)
MCHC: 32.7 g/dL (ref 30.0–36.0)
RDW: 13.8 % (ref 11.5–15.5)

## 2013-04-20 MED ORDER — METHOCARBAMOL 500 MG PO TABS: 500.0000 mg | ORAL_TABLET | Freq: Four times a day (QID) | ORAL | Status: DC | PRN

## 2013-04-20 MED ORDER — HYDROCODONE-ACETAMINOPHEN 5-325 MG PO TABS: 1.0000 | ORAL_TABLET | ORAL | Status: DC | PRN

## 2013-04-20 MED ORDER — ASPIRIN 325 MG PO TBEC: 325.0000 mg | DELAYED_RELEASE_TABLET | Freq: Two times a day (BID) | ORAL | Status: DC

## 2013-04-20 NOTE — Discharge Summary (Signed)
Patient ID: BREINDEL COLLIER MRN: [id number] DOB/AGE: 1941/04/11 72 y.o.  Admit date: 04/18/2013 Discharge date: 04/20/2013  Admission Diagnoses:  Principal Problem:   Right knee DJD   Discharge Diagnoses:  Same  Past Medical History  Diagnosis Date  . High cholesterol   . Thyroid disorder   . PONV (postoperative nausea and vomiting)   . Hypothyroidism   . Arthritis     Surgeries: Procedure(s): TOTAL KNEE ARTHROPLASTY on 04/18/2013   Consultants:    Discharged Condition: Improved  Hospital Course: Nyala Kirchner Morten is an 72 y.o. female who was admitted 04/18/2013 for operative treatment ofRight knee DJD. Patient has severe unremitting pain that affects sleep, daily activities, and work/hobbies. After pre-op clearance the patient was taken to the operating room on 04/18/2013 and underwent  Procedure(s): TOTAL KNEE ARTHROPLASTY.    Patient was given perioperative antibiotics: Anti-infectives   Start     Dose/Rate Route Frequency Ordered Stop   04/18/13 1900  ceFAZolin (ANCEF) IVPB 2 g/50 mL premix     2 g 100 mL/hr over 30 Minutes Intravenous Every 6 hours 04/18/13 1627 04/19/13 0109   04/18/13 0600  ceFAZolin (ANCEF) IVPB 2 g/50 mL premix     2 g 100 mL/hr over 30 Minutes Intravenous On call to O.R. 04/17/13 1421 04/18/13 1324       Patient was given sequential compression devices, early ambulation, and chemoprophylaxis to prevent DVT.  Patient benefited maximally from hospital stay and there were no complications.    Recent vital signs: Patient Vitals for the past 24 hrs:  BP Temp Temp src Pulse Resp SpO2  04/20/13 0750 - - - - 16 97 %  04/19/13 2035 115/58 mmHg 99.2 F (37.3 C) Oral 92 18 96 %  04/19/13 1400 138/76 mmHg 97.6 F (36.4 C) - 76 18 98 %     Recent laboratory studies:  Recent Labs  04/19/13 0515 04/20/13 0500  WBC 14.7* 12.4*  HGB 11.1* 11.1*  HCT 33.5* 33.9*  PLT 220 222  NA 141  --   K 4.0  --   CL 105  --   CO2 26  --   BUN 12  --    CREATININE 0.77  --   GLUCOSE 142*  --   CALCIUM 9.1  --      Discharge Medications:     Medication List         alendronate 70 MG tablet  Commonly known as:  FOSAMAX  Take 70 mg by mouth every 7 (seven) days. Take with a full glass of water on an empty stomach. Patient takes on Saturdays     aspirin 325 MG EC tablet  Take 1 tablet (325 mg total) by mouth 2 (two) times daily.     HYDROcodone-acetaminophen 5-325 MG per tablet  Commonly known as:  NORCO/VICODIN  Take 1-2 tablets by mouth every 4 (four) hours as needed.     levothyroxine 100 MCG tablet  Commonly known as:  SYNTHROID, LEVOTHROID  Take 100 mcg by mouth daily.     meloxicam 15 MG tablet  Commonly known as:  MOBIC  Take 15 mg by mouth daily.     methocarbamol 500 MG tablet  Commonly known as:  ROBAXIN  Take 1 tablet (500 mg total) by mouth every 6 (six) hours as needed.     multivitamin with minerals Tabs tablet  Take 1 tablet by mouth daily.     omega-3 acid ethyl esters 1 G capsule  Commonly known  as:  LOVAZA  Take 1 g by mouth 3 (three) times daily.     simvastatin 40 MG tablet  Commonly known as:  ZOCOR  Take 40 mg by mouth every evening.        Diagnostic Studies: Dg Chest 2 View  04/13/2013   CLINICAL DATA:  Pre operative respiratory EXAM. Osteoarthritis of the knee.  EXAM: CHEST  2 VIEW  COMPARISON:  04/24/2007  FINDINGS: The heart size and mediastinal contours are within normal limits. Both lungs are clear. The visualized skeletal structures are unremarkable.  IMPRESSION: No active cardiopulmonary disease.   Electronically Signed   By: Geanie Cooley M.D.   On: 04/13/2013 11:20    Disposition: 01-Home or Self Care      Discharge Orders   Future Orders Complete By Expires   Call MD / Call 911  As directed    Comments:     If you experience chest pain or shortness of breath, CALL 911 and be transported to the hospital emergency room.  If you develope a fever above 101 F, pus (white  drainage) or increased drainage or redness at the wound, or calf pain, call your surgeon's office.   Constipation Prevention  As directed    Comments:     Drink plenty of fluids.  Prune juice may be helpful.  You may use a stool softener, such as Colace (over the counter) 100 mg twice a day.  Use MiraLax (over the counter) for constipation as needed.   Diet - low sodium heart healthy  As directed    Increase activity slowly as tolerated  As directed       Follow-up Information   Follow up with DALLDORF,PETER G, MD. Call in 2 weeks.   Specialty:  Orthopedic Surgery   Contact information:   [address] [place] [address] [phone]       Follow up with [facility].   Contact information:   [address] [place] [address] [phone]        Signed: Prince Rome 04/20/2013, 10:58 AM

## 2013-04-20 NOTE — Progress Notes (Signed)
04/20/13 Set up with HHPT with [facility] by MD office. Spoke with patient, no change in d/c plan. [employer] providing CPM, rolling walker and 3N1. Jacquelynn Cree RN, BSN, CCM

## 2013-04-20 NOTE — Progress Notes (Signed)
Subjective: 2 Days Post-Op Procedure(s) (LRB): TOTAL KNEE ARTHROPLASTY (Right)  Activity level:  Passed therapy Diet tolerance:  Eating Voiding:  Voiding Patient reports pain as 1 on 0-10 scale.    Objective: Vital signs in last 24 hours: Temp:  [97.6 F (36.4 C)-99.2 F (37.3 C)] 99.2 F (37.3 C) (10/22 2035) Pulse Rate:  [76-92] 92 (10/22 2035) Resp:  [16-18] 16 (10/23 0750) BP: (115-138)/(58-76) 115/58 mmHg (10/22 2035) SpO2:  [96 %-98 %] 97 % (10/23 0750)  Labs:  Recent Labs  04/19/13 0515 04/20/13 0500  HGB 11.1* 11.1*    Recent Labs  04/19/13 0515 04/20/13 0500  WBC 14.7* 12.4*  RBC 3.67* 3.66*  HCT 33.5* 33.9*  PLT 220 222    Recent Labs  04/19/13 0515  NA 141  K 4.0  CL 105  CO2 26  BUN 12  CREATININE 0.77  GLUCOSE 142*  CALCIUM 9.1   No results found for this basename: LABPT, INR,  in the last 72 hours  Physical Exam:  Neurologically intact ABD soft Neurovascular intact Sensation intact distally Intact pulses distally Dorsiflexion/Plantar flexion intact No cellulitis present Dressing within normal  Assessment/Plan:  2 Days Post-Op Procedure(s) (LRB): TOTAL KNEE ARTHROPLASTY (Right) Advance diet Up with therapy Discharge home with home health ASA 325 one twice a day x2 weeks    Donna Daniels 04/20/2013, 10:53 AM

## 2013-04-20 NOTE — Progress Notes (Signed)
Physical Therapy Treatment Patient Details Name: Donna Daniels MRN: [id number] DOB: 1941-01-12 Today's Date: 04/20/2013 Time: 1324-4010 PT Time Calculation (min): 25 min  PT Assessment / Plan / Recommendation  History of Present Illness Pt is a 72 y/o female admitted s/p R TKA.   PT Comments   Pt progressing well towards physical therapy goals. Pt reports confidence with step training and demonstrated safe technique with good form. Encouraged HEP performance throughout day.  Follow Up Recommendations  Home health PT     Does the patient have the potential to tolerate intense rehabilitation     Barriers to Discharge        Equipment Recommendations  None recommended by PT    Recommendations for Other Services    Frequency 7X/week   Progress towards PT Goals Progress towards PT goals: Progressing toward goals  Plan Current plan remains appropriate    Precautions / Restrictions Precautions Precautions: Fall;Knee Required Braces or Orthoses: Knee Immobilizer - Right Knee Immobilizer - Right: On when out of bed or walking Restrictions Weight Bearing Restrictions: Yes RLE Weight Bearing: Weight bearing as tolerated   Pertinent Vitals/Pain 6/10 after ambulation and therapeutic exercise    Mobility  Bed Mobility Bed Mobility: Not assessed (Pt received up in chair) Transfers Transfers: Sit to Stand;Stand to Sit Sit to Stand: 4: Min guard;From chair/3-in-1;With upper extremity assist Stand to Sit: 4: Min guard;To chair/3-in-1;With upper extremity assist Details for Transfer Assistance: VC's for hand placement and LE position prior to initiating stand>sit Ambulation/Gait Ambulation/Gait Assistance: 4: Min guard Ambulation Distance (Feet): 150 Feet Assistive device: Rolling walker Ambulation/Gait Assistance Details: Improvement with sequencing and walker placement. VC's for increased heel strike and decreased step length on R. Gait Pattern: Step-to pattern;Step-through  pattern;Decreased stride length Gait velocity: decreased Stairs: Yes Stairs Assistance: 4: Min guard Stair Management Technique: One rail Right Number of Stairs: 5 Wheelchair Mobility Wheelchair Mobility: No    Exercises Total Joint Exercises Ankle Circles/Pumps: 15 reps Quad Sets: 15 reps Heel Slides: 15 reps Hip ABduction/ADduction: 15 reps Straight Leg Raises: 10 reps Goniometric ROM: 6-83   PT Diagnosis:    PT Problem List:   PT Treatment Interventions:     PT Goals (current goals can now be found in the care plan section) Acute Rehab PT Goals Patient Stated Goal: To return home with daughter PT Goal Formulation: With patient Time For Goal Achievement: 04/26/13 Potential to Achieve Goals: Good  Visit Information  Last PT Received On: 04/20/13 Assistance Needed: +1 History of Present Illness: Pt is a 72 y/o female admitted s/p R TKA.    Subjective Data  Patient Stated Goal: To return home with daughter   Cognition  Cognition Arousal/Alertness: Awake/alert Behavior During Therapy: WFL for tasks assessed/performed Overall Cognitive Status: Within Functional Limits for tasks assessed    Balance  Balance Balance Assessed: Yes Static Standing Balance Static Standing - Balance Support: Bilateral upper extremity supported Static Standing - Level of Assistance: 5: Stand by assistance Static Standing - Comment/# of Minutes: 1  End of Session PT - End of Session Equipment Utilized During Treatment: Gait belt Activity Tolerance: Patient tolerated treatment well Patient left: in chair;with call bell/phone within reach Nurse Communication: Mobility status CPM Right Knee CPM Right Knee: Off Right Knee Flexion (Degrees): 45 Right Knee Extension (Degrees): 0   GP     Ruthann Cancer 04/20/2013, 12:10 PM  Ruthann Cancer, PT, DPT Acute Rehabilitation Services [phone]

## 2013-04-21 DIAGNOSIS — Z96659 Presence of unspecified artificial knee joint: Secondary | ICD-10-CM | POA: Diagnosis not present

## 2013-04-21 DIAGNOSIS — IMO0001 Reserved for inherently not codable concepts without codable children: Secondary | ICD-10-CM | POA: Diagnosis not present

## 2013-04-21 DIAGNOSIS — M159 Polyosteoarthritis, unspecified: Secondary | ICD-10-CM | POA: Diagnosis not present

## 2013-04-21 DIAGNOSIS — Z471 Aftercare following joint replacement surgery: Secondary | ICD-10-CM | POA: Diagnosis not present

## 2013-04-24 DIAGNOSIS — IMO0001 Reserved for inherently not codable concepts without codable children: Secondary | ICD-10-CM | POA: Diagnosis not present

## 2013-04-24 DIAGNOSIS — Z471 Aftercare following joint replacement surgery: Secondary | ICD-10-CM | POA: Diagnosis not present

## 2013-04-24 DIAGNOSIS — Z96659 Presence of unspecified artificial knee joint: Secondary | ICD-10-CM | POA: Diagnosis not present

## 2013-04-24 DIAGNOSIS — M159 Polyosteoarthritis, unspecified: Secondary | ICD-10-CM | POA: Diagnosis not present

## 2013-04-25 DIAGNOSIS — IMO0001 Reserved for inherently not codable concepts without codable children: Secondary | ICD-10-CM | POA: Diagnosis not present

## 2013-04-25 DIAGNOSIS — Z96659 Presence of unspecified artificial knee joint: Secondary | ICD-10-CM | POA: Diagnosis not present

## 2013-04-25 DIAGNOSIS — M159 Polyosteoarthritis, unspecified: Secondary | ICD-10-CM | POA: Diagnosis not present

## 2013-04-25 DIAGNOSIS — Z471 Aftercare following joint replacement surgery: Secondary | ICD-10-CM | POA: Diagnosis not present

## 2013-04-26 DIAGNOSIS — Z471 Aftercare following joint replacement surgery: Secondary | ICD-10-CM | POA: Diagnosis not present

## 2013-04-26 DIAGNOSIS — Z96659 Presence of unspecified artificial knee joint: Secondary | ICD-10-CM | POA: Diagnosis not present

## 2013-04-26 DIAGNOSIS — M159 Polyosteoarthritis, unspecified: Secondary | ICD-10-CM | POA: Diagnosis not present

## 2013-04-26 DIAGNOSIS — IMO0001 Reserved for inherently not codable concepts without codable children: Secondary | ICD-10-CM | POA: Diagnosis not present

## 2013-04-28 DIAGNOSIS — M159 Polyosteoarthritis, unspecified: Secondary | ICD-10-CM | POA: Diagnosis not present

## 2013-04-28 DIAGNOSIS — Z96659 Presence of unspecified artificial knee joint: Secondary | ICD-10-CM | POA: Diagnosis not present

## 2013-04-28 DIAGNOSIS — Z471 Aftercare following joint replacement surgery: Secondary | ICD-10-CM | POA: Diagnosis not present

## 2013-04-28 DIAGNOSIS — IMO0001 Reserved for inherently not codable concepts without codable children: Secondary | ICD-10-CM | POA: Diagnosis not present

## 2013-05-01 DIAGNOSIS — Z471 Aftercare following joint replacement surgery: Secondary | ICD-10-CM | POA: Diagnosis not present

## 2013-05-01 DIAGNOSIS — IMO0001 Reserved for inherently not codable concepts without codable children: Secondary | ICD-10-CM | POA: Diagnosis not present

## 2013-05-01 DIAGNOSIS — M159 Polyosteoarthritis, unspecified: Secondary | ICD-10-CM | POA: Diagnosis not present

## 2013-05-01 DIAGNOSIS — M25569 Pain in unspecified knee: Secondary | ICD-10-CM | POA: Diagnosis not present

## 2013-05-01 DIAGNOSIS — M1710 Unilateral primary osteoarthritis, unspecified knee: Secondary | ICD-10-CM | POA: Diagnosis not present

## 2013-05-01 DIAGNOSIS — Z96659 Presence of unspecified artificial knee joint: Secondary | ICD-10-CM | POA: Diagnosis not present

## 2013-05-03 ENCOUNTER — Ambulatory Visit (HOSPITAL_COMMUNITY)
Admission: RE | Admit: 2013-05-03 | Discharge: 2013-05-03 | Disposition: A | Discharge status: Home / Self Care | Payer: Medicare Other | Source: Ambulatory Visit | Attending: Orthopaedic Surgery | Admitting: Orthopaedic Surgery

## 2013-05-03 DIAGNOSIS — IMO0001 Reserved for inherently not codable concepts without codable children: Secondary | ICD-10-CM | POA: Diagnosis not present

## 2013-05-03 DIAGNOSIS — M25669 Stiffness of unspecified knee, not elsewhere classified: Secondary | ICD-10-CM | POA: Insufficient documentation

## 2013-05-03 DIAGNOSIS — R262 Difficulty in walking, not elsewhere classified: Secondary | ICD-10-CM | POA: Diagnosis not present

## 2013-05-03 DIAGNOSIS — M25569 Pain in unspecified knee: Secondary | ICD-10-CM | POA: Diagnosis not present

## 2013-05-03 NOTE — Evaluation (Signed)
Physical Therapy Evaluation  Patient Details  Name: Donna Daniels MRN: [id number] Date of Birth: 22-Apr-1941  Today's Date: 05/03/2013 Time: 4098-1191 PT Time Calculation (min): 45 min Charge:  evaluation             Visit#: 1 of 10  Re-eval: 06/02/13 Assessment Diagnosis: TKR RT Surgical Date: 04/18/13 Next MD Visit: 05/22/2013 Prior Therapy: HH  Authorization:   medicare    Authorization Visit#:   of     Past Medical History:  Past Medical History  Diagnosis Date  . High cholesterol   . Thyroid disorder   . PONV (postoperative nausea and vomiting)   . Hypothyroidism   . Arthritis    Past Surgical History:  Past Surgical History  Procedure Laterality Date  . Cesarean section    . Vesicovaginal fistula closure w/ tah    . Thyroidectomy    . Hand surgery    . Abdominal hysterectomy    . Anterior cervical decomp/discectomy fusion  12/18/2011    Procedure: ANTERIOR CERVICAL DECOMPRESSION/DISCECTOMY FUSION 2 LEVELS;  Surgeon: Reinaldo Meeker, MD;  Location: [facility];  Service: Neurosurgery;  Laterality: N/A;  Anterior cervical decompression fusion four-six  . Total knee arthroplasty Right 04/18/2013    Procedure: TOTAL KNEE ARTHROPLASTY;  Surgeon: Velna Ochs, MD;  Location: [facility] OR;  Service: Orthopedics;  Laterality: Right;    Subjective Symptoms/Limitations Symptoms: [name] states that she had her Rt knee replaced on 04/18/2013.  She was discharged on 04/20/2013.  She recieved HH until 05/01/2013.  She is now being referred to theapy to maximize her functional condition.   How long can you sit comfortably?: Able to sit for 30 minutes. How long can you stand comfortably?: Able to stand for 30 minutes  How long can you walk comfortably?: able to walk with no assistive device for 15 minutes. Pain Assessment Currently in Pain?: Yes Pain Score: 5  Pain Location: Knee Pain Type: Surgical pain Pain Relieving Factors: ice Effect of Pain on Daily Activities:  increases pain    Prior Functioning  Prior Function Vocation: Retired Leisure: Hobbies-yes (Comment) Comments: yardwork, walk    Sensation/Coordination/Flexibility/Functional Tests Functional Tests Functional Tests: foto 54 risk adjusted 35  Assessment RLE AROM (degrees) Right Knee Extension: 15 Right Knee Flexion: 105 RLE Strength Right Hip Flexion: 5/5 Right Hip Extension: 3+/5 Right Hip ABduction: 5/5 Right Hip ADduction: 5/5 Right Knee Flexion: 5/5 Right Knee Extension: 4/5 Right Ankle Dorsiflexion: 3+/5  Exercise/Treatments Mobility/Balance  Static Standing Balance Single Leg Stance - Right Leg:  (2) Single Leg Stance - Left Leg: 2   Stretches Active Hamstring Stretch: 3 reps;30 seconds     Supine Quad Sets: 10 reps Heel Slides: 10 reps Terminal Knee Extension: 10 reps Knee Extension: PROM Knee Flexion: PROM   Physical Therapy Assessment and Plan PT Assessment and Plan Clinical Impression Statement: Pt 2 weeks s/p TKR with decrased ROM and decreased balance who will benefit from skilled PT to maximize functional mobility. Pt will benefit from skilled therapeutic intervention in order to improve on the following deficits: Decreased strength;Pain;Decreased activity tolerance;Decreased range of motion;Difficulty walking Rehab Potential: Good PT Frequency:  (3x week for two weeks then decrease to two times a week for two weeks.) PT Duration: 4 weeks PT Treatment/Interventions: Gait training;Therapeutic activities;Therapeutic exercise;Patient/family education;Manual techniques;Modalities PT Plan: concentrate on regaining extension, normalizing gt and balance.  Pt will need some work on flex, and strength of hamstrings and gluts     Goals Home Exercise  Program Pt/caregiver will Perform Home Exercise Program: For increased ROM PT Short Term Goals Time to Complete Short Term Goals: 2 weeks PT Short Term Goal 1: Pt to be able to sit for an hour to enjoy a meal  at a restaurant PT Short Term Goal 2: Pt to be able to walk for 30 minutes for short  shopping PT Short Term Goal 3:  ROM to be -9 to 115 to allow the above to occur  PT Long Term Goals Time to Complete Long Term Goals: 4 weeks PT Long Term Goal 1: Pt to be able to sit for two hours to travel PT Long Term Goal 2: Pt to be able to walk for an hour to complet shopping and improved healthy lifestyle Long Term Goal 3: Pt ROM to be 3-120 to allow normalized gt. Long Term Goal 4: strength wnl to allow reciprocal stairclimbing.  Problem List Patient Active Problem List   Diagnosis Date Noted  . Stiffness of joint, not elsewhere classified, lower leg 05/03/2013  . Difficulty in walking(719.7) 05/03/2013  . 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 CLOSED FRACTURE OF PATELLA 03/06/2010    PT - End of Session Activity Tolerance: Patient tolerated treatment well PT Plan of Care PT Home Exercise Plan: given  GP Functional Assessment Tool Used: Foto Functional Limitation: Mobility: Walking and moving around Mobility: Walking and Moving Around Current Status [phone]): At least 40 percent but less than 60 percent impaired, limited or restricted Mobility: Walking and Moving Around Goal Status [phone]): At least 20 percent but less than 40 percent impaired, limited or restricted  Donna Daniels,CINDY 05/03/2013, 4:42 PM  Physician Documentation Your signature is required to indicate approval of the treatment plan as stated above.  Please sign and either send electronically or make a copy of this report for your files and return this physician signed original.   Please mark one 1.__approve of plan  2. ___approve of plan with the following conditions.   ______________________________                                                          _____________________ Physician Signature                                                                                                             Date

## 2013-05-04 ENCOUNTER — Other Ambulatory Visit: Payer: Self-pay

## 2013-05-08 ENCOUNTER — Ambulatory Visit (HOSPITAL_COMMUNITY)
Admission: RE | Admit: 2013-05-08 | Discharge: 2013-05-08 | Disposition: A | Discharge status: Home / Self Care | Payer: Medicare Other | Source: Ambulatory Visit | Attending: Family Medicine | Admitting: Family Medicine

## 2013-05-08 DIAGNOSIS — M25669 Stiffness of unspecified knee, not elsewhere classified: Secondary | ICD-10-CM | POA: Diagnosis not present

## 2013-05-08 DIAGNOSIS — IMO0001 Reserved for inherently not codable concepts without codable children: Secondary | ICD-10-CM | POA: Diagnosis not present

## 2013-05-08 DIAGNOSIS — R262 Difficulty in walking, not elsewhere classified: Secondary | ICD-10-CM

## 2013-05-08 DIAGNOSIS — M25569 Pain in unspecified knee: Secondary | ICD-10-CM | POA: Diagnosis not present

## 2013-05-08 NOTE — Progress Notes (Signed)
Physical Therapy Treatment Patient Details  Name: Donna Daniels MRN: [id number] Date of Birth: January 29, 1941  Today's Date: 05/08/2013 Time: 0800-0855 PT Time Calculation (min): 55 min Charge: Therex [phone]  Visit#: 2 of 10  Re-eval:   Assessment Diagnosis: TKR RT Surgical Date: 04/18/13 Next MD Visit: 05/22/2013 Prior Therapy: HH  Subjective: Symptoms/Limitations Symptoms: Pt stated she was sore on front of thigh today, reports compliance with HEP trying to increase reps with quad sets everyday. Pain Assessment Currently in Pain?: No/denies (soreness)  Precautions/Restrictions  Precautions Precautions: Fall;Knee Required Braces or Orthoses: Knee Immobilizer - Right Knee Immobilizer - Right: On when out of bed or walking  Exercise/Treatments Stretches Active Hamstring Stretch: 3 reps;30 seconds Quad Stretch: 3 reps;30 seconds Standing Heel Raises: 10 reps;Limitations Heel Raises Limitations: toe raises  Knee Flexion: 10 reps;Limitations Knee Flexion Limitations: 3# Functional Squat: 10 reps;5 seconds Supine Quad Sets: 10 reps;Limitations Quad Sets Limitations: 5" holds Short Arc Quad Sets: 20 reps;Limitations Short Arc Quad Sets Limitations: 3# Heel Slides: 10 reps Terminal Knee Extension: 10 reps Knee Extension: PROM Knee Flexion: PROM Prone  Hip Extension: Left;15 reps Other Prone Exercises: TKE 10x5"   Modalities Modalities: Cryotherapy Cryotherapy Number Minutes Cryotherapy: 10 Minutes Cryotherapy Location: Knee Type of Cryotherapy: Ice pack  Physical Therapy Assessment and Plan PT Assessment and Plan Clinical Impression Statement: Began POC for Rt knee focusing on ROM and strengthening.  Pt able to demonstrate approriate technique with all exercises following cueing for technique and form with functional squats.  Improved AROM through therex today. PT Plan: concentrate on regaining extension, normalizing gt and balance.  Pt will  need some work on flex, and strength of hamstrings and gluts     Goals Home Exercise Program Pt/caregiver will Perform Home Exercise Program: For increased ROM PT Short Term Goals Time to Complete Short Term Goals: 2 weeks PT Short Term Goal 1: Pt to be able to sit for an hour to enjoy a meal at a restaurant PT Short Term Goal 2: Pt to be able to walk for 30 minutes for short  shopping PT Short Term Goal 3:  ROM to be -9 to 115 to allow the above to occur  PT Short Term Goal 3 - Progress: Progressing toward goal PT Long Term Goals Time to Complete Long Term Goals: 4 weeks PT Long Term Goal 1: Pt to be able to sit for two hours to travel PT Long Term Goal 2: Pt to be able to walk for an hour to complet shopping and improved healthy lifestyle Long Term Goal 3: Pt ROM to be 3-120 to allow normalized gt. Long Term Goal 4: strength wnl to allow reciprocal stairclimbing.  Problem List Patient Active Problem List   Diagnosis Date Noted  . Stiffness of joint, not elsewhere classified, lower leg 05/03/2013  . Difficulty in walking(719.7) 05/03/2013  . 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 CLOSED FRACTURE OF PATELLA 03/06/2010    PT - End of Session Activity Tolerance: Patient tolerated treatment well General Behavior During Therapy: [facility] for tasks assessed/performed  GP    Juel Burrow 05/08/2013, 8:47 AM

## 2013-05-10 ENCOUNTER — Ambulatory Visit (HOSPITAL_COMMUNITY)
Admission: RE | Admit: 2013-05-10 | Discharge: 2013-05-10 | Disposition: A | Discharge status: Home / Self Care | Payer: Medicare Other | Source: Ambulatory Visit | Attending: Family Medicine | Admitting: Family Medicine

## 2013-05-10 DIAGNOSIS — M25669 Stiffness of unspecified knee, not elsewhere classified: Secondary | ICD-10-CM | POA: Diagnosis not present

## 2013-05-10 DIAGNOSIS — R262 Difficulty in walking, not elsewhere classified: Secondary | ICD-10-CM | POA: Diagnosis not present

## 2013-05-10 DIAGNOSIS — M25569 Pain in unspecified knee: Secondary | ICD-10-CM | POA: Diagnosis not present

## 2013-05-10 DIAGNOSIS — IMO0001 Reserved for inherently not codable concepts without codable children: Secondary | ICD-10-CM | POA: Diagnosis not present

## 2013-05-10 NOTE — Progress Notes (Signed)
Physical Therapy Treatment Patient Details  Name: Donna Daniels MRN: [id number] Date of Birth: [birth year]  Today's Date: 05/10/2013 Time: 5188-4166 PT Time Calculation (min): 48 min 46' therex  Visit#: 3 of 10  Re-eval: 06/02/13    Authorization:    Authorization Time Period:    Authorization Visit#:   of     Subjective: Symptoms/Limitations Symptoms: patient reports no R knee pain today only "soreness" from new exercises     Exercise/Treatments Active Hamstring Stretch: 3 reps;30 seconds Quad Stretch: 3 reps;30 seconds Standing Heel Raises: 10 reps;Limitations Heel Raises Limitations: toe raises  Knee Flexion: 10 reps;Limitations Knee Flexion Limitations: 3# Lateral Step Up: 10 reps;Step Height: 4";Hand Hold: 1 Forward Step Up: Hand Hold: 2;10 reps;Step Height: 4" Step Down: Hand Hold: 1;10 reps;Step Height: 2" Functional Squat: 10 reps Seated   Supine Quad Sets: 10 reps Quad Sets Limitations: 5" holds Short Arc Quad Sets: 20 reps;Limitations Short Arc Quad Sets Limitations: 3# Heel Slides: 10 reps Terminal Knee Extension: 10 reps;Limitations (3#) Knee Extension: PROM Knee Flexion: PROM   Prone  Hip Extension: 15 reps Other Prone Exercises: TKE 10x5"   Modalities Modalities: Cryotherapy Manual Therapy Manual Therapy: Massage Massage: scar tissue massage Cryotherapy Number Minutes Cryotherapy: 10 Minutes Cryotherapy Location: Knee Type of Cryotherapy: Ice pack  Physical Therapy Assessment and Plan PT Assessment and Plan Clinical Impression Statement: Patient able to completed all therex pain free; scar massage performed as well as patellar mobs to increase ROM; step ups added.  PT Plan: concentrate on regaining extension, normalizing gt and balance.  Pt will need some work on flex, and strength of hamstrings and gluts;Continue with PT POC    Goals    Problem List Patient Active Problem List   Diagnosis Date Noted  . Stiffness of joint, not  elsewhere classified, lower leg 05/03/2013  . Difficulty in walking(719.7) 05/03/2013  . 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 CLOSED FRACTURE OF PATELLA 03/06/2010       GP    Mylea Roarty ATKINSO 05/10/2013, 11:07 AM

## 2013-05-12 ENCOUNTER — Ambulatory Visit (HOSPITAL_COMMUNITY)
Admission: RE | Admit: 2013-05-12 | Discharge: 2013-05-12 | Disposition: A | Discharge status: Home / Self Care | Payer: Medicare Other | Source: Ambulatory Visit | Attending: Family Medicine | Admitting: Family Medicine

## 2013-05-12 DIAGNOSIS — R262 Difficulty in walking, not elsewhere classified: Secondary | ICD-10-CM

## 2013-05-12 DIAGNOSIS — M25669 Stiffness of unspecified knee, not elsewhere classified: Secondary | ICD-10-CM | POA: Diagnosis not present

## 2013-05-12 DIAGNOSIS — M25569 Pain in unspecified knee: Secondary | ICD-10-CM | POA: Diagnosis not present

## 2013-05-12 DIAGNOSIS — IMO0001 Reserved for inherently not codable concepts without codable children: Secondary | ICD-10-CM | POA: Diagnosis not present

## 2013-05-12 NOTE — Progress Notes (Signed)
Physical Therapy Treatment Patient Details  Name: Donna Daniels MRN: [id number] Date of Birth: 1940-07-03  Today's Date: 05/12/2013 Time: 0804-0905 PT Time Calculation (min): 61 min There ex [phone]; Ice [phone] Visit#: 4 of 10  Re-eval: 06/02/13    Subjective: Symptoms/Limitations Symptoms: I haven't had to take pain meds like I normally have to;  it feels good to be bending my knee. Pain Assessment Currently in Pain?: No/denies   Exercise/Treatments     Stretches Active Hamstring Stretch: 3 reps;30 seconds Quad Stretch: 3 reps;30 seconds Gastroc Stretch: 3 reps;30 seconds Soleus Stretch:  (slant board 3x30')   Standing Lateral Step Up: 10 reps;Step Height: 4" Forward Step Up: 10 reps;Step Height: 4" Step Down: 10 reps;Step Height: 2" Functional Squat: 10 reps SLS with Vectors:  (2 x 10" each no hands therapist spotting) Seated Long Arc Quad: Strengthening;10 reps;Weights Long Arc Quad Weight: 3 lbs. Supine Quad Sets: 10 reps Heel Slides: 10 reps Terminal Knee Extension: 10 reps;Limitations (3#) Knee Extension: PROM Knee Flexion: PROM   Prone  Hip Extension: 15 reps;Limitations Hip Extension Limitations: 3# Other Prone Exercises: TKE 10x5"   Modalities Modalities: Cryotherapy Cryotherapy Number Minutes Cryotherapy: 10 Minutes  Physical Therapy Assessment and Plan PT Assessment and Plan Clinical Impression Statement: Todays treatment concentrated on balance.  Pt ROM and strength imporoving nicely.   PT Treatment/Interventions: Gait training;Therapeutic activities;Therapeutic exercise;Patient/family education;Manual techniques;Modalities PT Plan: concentrate on regaining extension, normalizing gt and balance.  Pt will need some work on flex, and strength of hamstrings and gluts;Continue with PT POC    Goals  progressing  Problem List Patient Active Problem List   Diagnosis Date Noted  . Stiffness of joint, not elsewhere classified, lower leg  05/03/2013  . Difficulty in walking(719.7) 05/03/2013  . 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 CLOSED FRACTURE OF PATELLA 03/06/2010    PT - End of Session Activity Tolerance: Patient tolerated treatment well General Behavior During Therapy: [facility] for tasks assessed/performed  GP Functional Assessment Tool Used: Foto  Meerab Maselli,CINDY 05/12/2013, 9:36 AM

## 2013-05-15 ENCOUNTER — Ambulatory Visit (HOSPITAL_COMMUNITY)
Admission: RE | Admit: 2013-05-15 | Discharge: 2013-05-15 | Disposition: A | Discharge status: Home / Self Care | Payer: Medicare Other | Source: Ambulatory Visit | Attending: Family Medicine | Admitting: Family Medicine

## 2013-05-15 DIAGNOSIS — R262 Difficulty in walking, not elsewhere classified: Secondary | ICD-10-CM | POA: Diagnosis not present

## 2013-05-15 DIAGNOSIS — M25669 Stiffness of unspecified knee, not elsewhere classified: Secondary | ICD-10-CM | POA: Diagnosis not present

## 2013-05-15 DIAGNOSIS — IMO0001 Reserved for inherently not codable concepts without codable children: Secondary | ICD-10-CM | POA: Diagnosis not present

## 2013-05-15 DIAGNOSIS — M25569 Pain in unspecified knee: Secondary | ICD-10-CM | POA: Diagnosis not present

## 2013-05-15 NOTE — Progress Notes (Signed)
Physical Therapy Treatment Patient Details  Name: Donna Daniels MRN: [id number] Date of Birth: 1940-10-21  Today's Date: 05/15/2013 Time: 6578-4696 PT Time Calculation (min): 45 min There ex [phone] Visit#: 5 of 10  Re-eval: 06/02/13   Subjective: Symptoms/Limitations Symptoms: Pt states last night was the worst night she had with her knee. I think it was the way I had my knee bent when I was sleeping it was just stiff. Pain Assessment Currently in Pain?: No/denies   Exercise/Treatments   Stretches Passive Hamstring Stretch: 2 reps;60 seconds;Limitations Passive Hamstring Stretch Limitations: long sit. Soleus Stretch:  (slant board 3x30')   Standing Knee Flexion: Right;10 reps;Limitations Knee Flexion Limitations: 3 Lateral Step Up: 10 reps;Step Height: 4" Forward Step Up: 10 reps;Step Height: 4" Functional Squat: 10 reps Rocker Board: 1 minute;Limitations Rocker Board Limitations: Rt/Lt and Ant/ Post x 2 SLS:  (8 sec max x 5) Gait Training: working on heel toe and increased knee flex  Supine Quad Sets: 10 reps Terminal Knee Extension: 10 reps;Limitations (3#) Knee Extension: PROM Knee Flexion: PROM Prone  Hip Extension: 15 reps;Limitations Hip Extension Limitations: 3# Other Prone Exercises: TKE 10x5"    Physical Therapy Assessment and Plan PT Assessment and Plan Clinical Impression Statement: Pt ROM improving nicely.  Flexion to 122.  Pt continues to have decreased knee flexion with gait unless reminded.  PT Plan: continue to work on extension, balance and normalizing gt    Goals  progressing  Problem List Patient Active Problem List   Diagnosis Date Noted  . Stiffness of joint, not elsewhere classified, lower leg 05/03/2013  . Difficulty in walking(719.7) 05/03/2013  . 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 CLOSED FRACTURE OF PATELLA 03/06/2010       GP    RUSSELL,CINDY 05/15/2013, 12:03 PM

## 2013-05-17 ENCOUNTER — Ambulatory Visit (HOSPITAL_COMMUNITY)
Admission: RE | Admit: 2013-05-17 | Discharge: 2013-05-17 | Disposition: A | Discharge status: Home / Self Care | Payer: Medicare Other | Source: Ambulatory Visit | Attending: Family Medicine | Admitting: Family Medicine

## 2013-05-17 DIAGNOSIS — M25669 Stiffness of unspecified knee, not elsewhere classified: Secondary | ICD-10-CM | POA: Diagnosis not present

## 2013-05-17 DIAGNOSIS — IMO0001 Reserved for inherently not codable concepts without codable children: Secondary | ICD-10-CM | POA: Diagnosis not present

## 2013-05-17 DIAGNOSIS — M25569 Pain in unspecified knee: Secondary | ICD-10-CM | POA: Diagnosis not present

## 2013-05-17 DIAGNOSIS — R262 Difficulty in walking, not elsewhere classified: Secondary | ICD-10-CM | POA: Diagnosis not present

## 2013-05-17 NOTE — Progress Notes (Signed)
Physical Therapy Treatment Patient Details  Name: Donna Daniels MRN: [id number] Date of Birth: 1941/05/10  Today's Date: 05/17/2013 Time: 5643-3295 PT Time Calculation (min): 45 min Charges: Therex x 18'(8416-6063) Ice x 10'(1008-1018)  Visit#: 6 of 10  Re-eval: 06/02/13   Subjective: Symptoms/Limitations Symptoms: Pt states that she is currently pain free. She continues to have trouble sleeping.  Pain Assessment Currently in Pain?: No/denies  Precautions/Restrictions     Exercise/Treatments Standing Knee Flexion: 15 reps Knee Flexion Limitations: 3# Lateral Step Up: 15 reps;Hand Hold: 2;Step Height: 4" Forward Step Up: 15 reps;Step Height: 6";Hand Hold: 1;Right Functional Squat: 15 reps Rocker Board: 2 minutes Rocker Board Limitations: R/L A/P Supine Quad Sets: 10 reps Quad Sets Limitations: 10" holds Bridges: 10 reps Straight Leg Raises: 10 reps;Right Knee Extension: PROM Prone  Hip Extension: 15 reps Hip Extension Limitations: 5#   Modalities Modalities: Cryotherapy Cryotherapy Number Minutes Cryotherapy: 10 Minutes Cryotherapy Location: Knee (Right) Type of Cryotherapy: Ice pack  Physical Therapy Assessment and Plan PT Assessment and Plan Clinical Impression Statement: Pt completes therex well after initial cueing and demo. ROM continues to improve. Pt also displays improved gait mechanics at end of session. Ice applied at end of session to limit pain and inflammation. PT Plan: Continue to work on extension, balance and normalizing gait.    Problem List Patient Active Problem List   Diagnosis Date Noted  . Stiffness of joint, not elsewhere classified, lower leg 05/03/2013  . Difficulty in walking(719.7) 05/03/2013  . 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 CLOSED FRACTURE OF PATELLA 03/06/2010    General Behavior During Therapy: [facility] for tasks assessed/performed  Seth Bake, PTA  05/17/2013, 11:24 AM

## 2013-05-19 ENCOUNTER — Ambulatory Visit (HOSPITAL_COMMUNITY)
Admission: RE | Admit: 2013-05-19 | Discharge: 2013-05-19 | Disposition: A | Discharge status: Home / Self Care | Payer: Medicare Other | Source: Ambulatory Visit | Attending: Family Medicine | Admitting: Family Medicine

## 2013-05-19 DIAGNOSIS — R262 Difficulty in walking, not elsewhere classified: Secondary | ICD-10-CM

## 2013-05-19 DIAGNOSIS — M25569 Pain in unspecified knee: Secondary | ICD-10-CM | POA: Diagnosis not present

## 2013-05-19 DIAGNOSIS — IMO0001 Reserved for inherently not codable concepts without codable children: Secondary | ICD-10-CM | POA: Diagnosis not present

## 2013-05-19 DIAGNOSIS — M25669 Stiffness of unspecified knee, not elsewhere classified: Secondary | ICD-10-CM

## 2013-05-19 NOTE — Progress Notes (Signed)
Physical Therapy Treatment Patient Details  Name: Donna Daniels MRN: [id number] Date of Birth: 08-Dec-1940  Today's Date: 05/19/2013 Time: 1103-1205 PT Time Calculation (min): 62 min Charge:  There ex [id number]; manual [id number];IP [id number] Visit#: 7 of 10  Re-eval: 06/02/13    Authorization:   medicare Subjective: Symptoms/Limitations Symptoms: Pt states most of her pain is at night so she is still having difficulty sleeping.  Exercise/Treatments     Stretches Passive Hamstring Stretch: 2 reps;60 seconds;Limitations Passive Hamstring Stretch Limitations: long sit. Gastroc Stretch: 3 reps;30 seconds Soleus Stretch:  (slant board 3x30')   Standing Rocker Board: 2 minutes;Limitations Rocker Board Limitations: Rt/Lt and Ant/ Post x 2 SLS:  (8 sec max x 5) Gait Training: working on heel toe and increased knee flex  Supine Quad Sets: 15 reps Terminal Knee Extension: 10 reps;Limitations (3#) Knee Extension: PROM   Prone  Hip Extension: 15 reps;Limitations Hip Extension Limitations: 3# Other Prone Exercises: TKE 10x5"   Manual Therapy Massage: manual to posterior knee while supine to decrese tension and improve ROM Cryotherapy Number Minutes Cryotherapy: 10 Minutes  Physical Therapy Assessment and Plan PT Assessment and Plan Clinical Impression Statement: Pt completed balance activities facilitated by therapist.  Pt continues to gain ROM for extension, flexion is wfl PT Plan: Continue to work on extension, balance and normalizing gait.  Re-evaluate for MD appointment on Tuesday.      Problem List Patient Active Problem List   Diagnosis Date Noted  . Stiffness of joint, not elsewhere classified, lower leg 05/03/2013  . Difficulty in walking(719.7) 05/03/2013  . 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 CLOSED FRACTURE OF PATELLA 03/06/2010    PT - End of Session Activity Tolerance: Patient tolerated treatment well General Behavior During Therapy: [facility]  for tasks assessed/performed  GP    RUSSELL,CINDY 05/19/2013, 12:03 PM

## 2013-05-23 ENCOUNTER — Ambulatory Visit (HOSPITAL_COMMUNITY)
Admission: RE | Admit: 2013-05-23 | Discharge: 2013-05-23 | Disposition: A | Discharge status: Home / Self Care | Payer: Medicare Other | Source: Ambulatory Visit | Attending: Family Medicine | Admitting: Family Medicine

## 2013-05-23 DIAGNOSIS — R262 Difficulty in walking, not elsewhere classified: Secondary | ICD-10-CM | POA: Diagnosis not present

## 2013-05-23 DIAGNOSIS — M25669 Stiffness of unspecified knee, not elsewhere classified: Secondary | ICD-10-CM

## 2013-05-23 DIAGNOSIS — M1710 Unilateral primary osteoarthritis, unspecified knee: Secondary | ICD-10-CM | POA: Diagnosis not present

## 2013-05-23 DIAGNOSIS — IMO0001 Reserved for inherently not codable concepts without codable children: Secondary | ICD-10-CM | POA: Diagnosis not present

## 2013-05-23 DIAGNOSIS — M25569 Pain in unspecified knee: Secondary | ICD-10-CM | POA: Diagnosis not present

## 2013-05-23 NOTE — Evaluation (Addendum)
Physical Therapy Evaluation  Patient Details  Name: Donna Daniels MRN: [id number] Date of Birth: 1940-12-29  Today's Date: 05/23/2013 Time: 0930-1010 PT Time Calculation (min): 40 min Charge:  Mm test 930-940; there ex [phone];  Physical performance 1000-1000             Visit#: 8 of 8  Re-eval: 06/02/13 Assessment Diagnosis: TKR RT Surgical Date: 04/18/13 Next MD Visit: 05/22/2013 Prior Therapy: HH  Authorization: medicare    Past Medical History:  Past Medical History  Diagnosis Date  . High cholesterol   . Thyroid disorder   . PONV (postoperative nausea and vomiting)   . Hypothyroidism   . Arthritis    Past Surgical History:  Past Surgical History  Procedure Laterality Date  . Cesarean section    . Vesicovaginal fistula closure w/ tah    . Thyroidectomy    . Hand surgery    . Abdominal hysterectomy    . Anterior cervical decomp/discectomy fusion  12/18/2011    Procedure: ANTERIOR CERVICAL DECOMPRESSION/DISCECTOMY FUSION 2 LEVELS;  Surgeon: Reinaldo Meeker, MD;  Location: [facility];  Service: Neurosurgery;  Laterality: N/A;  Anterior cervical decompression fusion four-six  . Total knee arthroplasty Right 04/18/2013    Procedure: TOTAL KNEE ARTHROPLASTY;  Surgeon: Velna Ochs, MD;  Location: [facility];  Service: Orthopedics;  Laterality: Right;    Subjective Symptoms/Limitations Symptoms: Pt states her main concern is sleeping at night. How long can you sit comfortably?: Pt  is ablet to sit for 40 minuts was 30. How long can you stand comfortably?: Pt is able to stand as long as she wants now  How long can you walk comfortably?: PT is walking with no assistive device for 30 minutes now. Pain Assessment Currently in Pain?: No/denies (at night pain goes as high as a 6/10) Pain Location: Knee Pain Relieving Factors: icing 4-5 times a day  Precautions/Restrictions  Precautions Precautions: Fall;Knee Required Braces or Orthoses: Knee Immobilizer - Right Knee  Immobilizer - Right: On when out of bed or walking     Sensation/Coordination/Flexibility/Functional Tests Functional Tests Functional Tests: foto was 54 now 64  Assessment RLE AROM (degrees) Right Knee Extension: 4 (was 8) Right Knee Flexion: 122 (was 114 ) RLE PROM (degrees) Right Knee Extension: 0 RLE Strength Right Hip Flexion: 5/5 (was 5/5) Right Hip Extension: 5/5 (was 3+/5) Right Hip ABduction: 5/5 (was 5/5) Right Hip ADduction: 5/5 (was 5/5) Right Knee Flexion: 5/5 (was 5/5) Right Knee Extension: 5/5 (was 4/5) Right Ankle Dorsiflexion: 4/5 (was 3+/5)  Exercise/Treatments Mobility/Balance  Static Standing Balance Single Leg Stance - Right Leg: 10 (was 2 seconds) Single Leg Stance - Left Leg: 18 (was 2 seconds.)   Stretches Soleus Stretch:  (slant board 30 sec x 3) Aerobic Stationary Bike: nustep L4 x 10:00   Supine Quad Sets: 15 reps  Physical Therapy Assessment and Plan PT Assessment and Plan Clinical Impression Statement: Pt continues to progress in ROM and balance; strength is wnl.  Pt no longer in need of skilled therapy will discharge to a HEP PT Plan: discharge to a HEP    Goals Home Exercise Program Pt/caregiver will Perform Home Exercise Program: For increased ROM PT Short Term Goals PT Short Term Goal 1: Pt to be able to sit for an hour to enjoy a meal at a restaurant PT Short Term Goal 1 - Progress: Met PT Short Term Goal 2: Pt to be able to walk for 30 minutes for short  shopping PT  Short Term Goal 2 - Progress: Met PT Short Term Goal 3:  ROM to be -9 to 115 to allow the above to occur  PT Short Term Goal 3 - Progress: Met PT Long Term Goals Time to Complete Long Term Goals: 4 weeks PT Long Term Goal 1: Pt to be able to sit for two hours to travel PT Long Term Goal 1 - Progress: Met PT Long Term Goal 2: Pt to be able to walk for an hour to complete shopping and improved healthy lifestyle PT Long Term Goal 2 - Progress: Partly met (has not  tried) Long Term Goal 3: Pt ROM to be 3-120 to allow normalized gt. Long Term Goal 3 Progress: Progressing toward goal (4-122) Long Term Goal 4: strength wnl to allow reciprocal stairclimbing. Long Term Goal 4 Progress: Met  Problem List Patient Active Problem List   Diagnosis Date Noted  . Stiffness of joint, not elsewhere classified, lower leg 05/03/2013  . Difficulty in walking(719.7) 05/03/2013  . 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 CLOSED FRACTURE OF PATELLA 03/06/2010    PT - End of Session Activity Tolerance: Patient tolerated treatment well General Behavior During Therapy: WFL for tasks assessed/performed PT Plan of Care PT Home Exercise Plan: given  GP Functional Limitation: Mobility: Walking and moving around Mobility: Walking and Moving Around Current Status (A5409): At least 20 percent but less than 40 percent impaired, limited or restricted Mobility: Walking and Moving Around Goal Status [phone]): At least 20 percent but less than 40 percent impaired, limited or restricted  RUSSELL,CINDY 05/23/2013, 10:09 AM  Physician Documentation Your signature is required to indicate approval of the treatment plan as stated above.  Please sign and either send electronically or make a copy of this report for your files and return this physician signed original.   Please mark one 1.__approve of plan  2. ___approve of plan with the following conditions.   ______________________________                                                          _____________________ Physician Signature                                                                                                             Date

## 2013-06-19 DIAGNOSIS — M1710 Unilateral primary osteoarthritis, unspecified knee: Secondary | ICD-10-CM | POA: Diagnosis not present

## 2013-06-19 DIAGNOSIS — M25569 Pain in unspecified knee: Secondary | ICD-10-CM | POA: Diagnosis not present

## 2013-07-04 ENCOUNTER — Other Ambulatory Visit: Payer: Self-pay | Admitting: Orthopaedic Surgery

## 2013-07-11 ENCOUNTER — Encounter (HOSPITAL_COMMUNITY): Payer: Self-pay | Admitting: Pharmacy Technician

## 2013-07-17 ENCOUNTER — Encounter (HOSPITAL_COMMUNITY)
Admission: RE | Admit: 2013-07-17 | Discharge: 2013-07-17 | Disposition: A | Discharge status: Home / Self Care | Payer: Medicare Other | Source: Ambulatory Visit | Attending: Orthopaedic Surgery | Admitting: Orthopaedic Surgery

## 2013-07-17 ENCOUNTER — Encounter (HOSPITAL_COMMUNITY): Payer: Self-pay

## 2013-07-17 DIAGNOSIS — Z01818 Encounter for other preprocedural examination: Secondary | ICD-10-CM | POA: Insufficient documentation

## 2013-07-17 DIAGNOSIS — Z01812 Encounter for preprocedural laboratory examination: Secondary | ICD-10-CM | POA: Insufficient documentation

## 2013-07-17 HISTORY — DX: Pain in unspecified joint: M25.50

## 2013-07-17 HISTORY — DX: Unspecified cataract: H26.9

## 2013-07-17 HISTORY — DX: Personal history of other diseases of the respiratory system: Z87.09

## 2013-07-17 HISTORY — DX: Sciatica, unspecified side: M54.30

## 2013-07-17 HISTORY — DX: Spontaneous ecchymoses: R23.3

## 2013-07-17 HISTORY — DX: Effusion, unspecified joint: M25.40

## 2013-07-17 HISTORY — DX: Other skin changes: R23.8

## 2013-07-17 LAB — URINALYSIS, ROUTINE W REFLEX MICROSCOPIC
GLUCOSE, UA: NEGATIVE mg/dL
Hgb urine dipstick: NEGATIVE
KETONES UR: 15 mg/dL — AB
Nitrite: NEGATIVE
Protein, ur: NEGATIVE mg/dL
Specific Gravity, Urine: 1.029 (ref 1.005–1.030)
Urobilinogen, UA: 1 mg/dL (ref 0.0–1.0)
pH: 6 (ref 5.0–8.0)

## 2013-07-17 LAB — BASIC METABOLIC PANEL
BUN: 17 mg/dL (ref 6–23)
CO2: 25 mEq/L (ref 19–32)
Calcium: 10.3 mg/dL (ref 8.4–10.5)
Chloride: 100 mEq/L (ref 96–112)
Creatinine, Ser: 0.87 mg/dL (ref 0.50–1.10)
GFR, EST AFRICAN AMERICAN: 75 mL/min — AB (ref >90)
GFR, EST NON AFRICAN AMERICAN: 65 mL/min — AB (ref >90)
Glucose, Bld: 154 mg/dL — ABNORMAL HIGH (ref 70–99)
Potassium: 4.4 mEq/L (ref 3.7–5.3)
SODIUM: 141 meq/L (ref 137–147)

## 2013-07-17 LAB — CBC WITH DIFFERENTIAL/PLATELET
BASOS PCT: 1 % (ref 0–1)
Basophils Absolute: 0 10*3/uL (ref 0.0–0.1)
Eosinophils Absolute: 0.1 10*3/uL (ref 0.0–0.7)
Eosinophils Relative: 1 % (ref 0–5)
HCT: 43.4 % (ref 36.0–46.0)
HEMOGLOBIN: 14.7 g/dL (ref 12.0–15.0)
LYMPHS ABS: 2.9 10*3/uL (ref 0.7–4.0)
Lymphocytes Relative: 39 % (ref 12–46)
MCH: 30.4 pg (ref 26.0–34.0)
MCHC: 33.9 g/dL (ref 30.0–36.0)
MCV: 89.9 fL (ref 78.0–100.0)
Monocytes Absolute: 0.9 10*3/uL (ref 0.1–1.0)
Monocytes Relative: 12 % (ref 3–12)
NEUTROS ABS: 3.6 10*3/uL (ref 1.7–7.7)
NEUTROS PCT: 48 % (ref 43–77)
Platelets: 280 10*3/uL (ref 150–400)
RBC: 4.83 MIL/uL (ref 3.87–5.11)
RDW: 13.5 % (ref 11.5–15.5)
WBC: 7.6 10*3/uL (ref 4.0–10.5)

## 2013-07-17 LAB — URINE MICROSCOPIC-ADD ON

## 2013-07-17 LAB — TYPE AND SCREEN
ABO/RH(D): A POS
Antibody Screen: NEGATIVE

## 2013-07-17 LAB — APTT: APTT: 28 s (ref 24–37)

## 2013-07-17 LAB — PROTIME-INR
INR: 0.94 (ref 0.00–1.49)
Prothrombin Time: 12.4 seconds (ref 11.6–15.2)

## 2013-07-17 LAB — SURGICAL PCR SCREEN
MRSA, PCR: NEGATIVE
STAPHYLOCOCCUS AUREUS: NEGATIVE

## 2013-07-17 MED ORDER — CHLORHEXIDINE GLUCONATE 4 % EX LIQD: 60.0000 mL | Freq: Once | CUTANEOUS | Status: DC

## 2013-07-17 NOTE — Pre-Procedure Instructions (Signed)
Gilman SchmidtCarolyn P Scott  07/17/2013   Your procedure is scheduled on:  Tues, Jan 27 @ 7:30 AM  [facility]  at 5:30 AM.  Call this number if you have problems the morning of surgery: [phone]   Remember:   Do not eat food or drink liquids after midnight.   Take these medicines the morning of surgery with A SIP OF WATER: Pain Pill(if needed) and Synthroid(Levothyroxine)             Stop taking your Mobic and Lovaza. No Goody's,BC's,Aleve,Aspirin,Ibuprofen,or any Herbal Medications   Do not wear jewelry, make-up or nail polish.  Do not wear lotions, powders, or perfumes. You may wear deodorant.  Do not shave 48 hours prior to surgery.   Do not bring valuables to the hospital.  [facility]                  for any belongings or valuables.               Contacts, dentures or bridgework may not be worn into surgery.  Leave suitcase in the car. After surgery it may be brought to your room.  For patients admitted to the hospital, discharge time is determined by your                treatment team.                Special Instructions: Shower using CHG 2 nights before surgery and the night before surgery.  If you shower the day of surgery use CHG.  Use special wash - you have one bottle of CHG for all showers.  You should use approximately 1/3 of the bottle for each shower.   Please read over the following fact sheets that you were given: Pain Booklet, Coughing and Deep Breathing, Blood Transfusion Information, MRSA Information and Surgical Site Infection Prevention

## 2013-07-17 NOTE — Progress Notes (Addendum)
Saw Dr.ROthbart in 2008 and hasn't been  Heart cath in 1998 with Dr.Crenshaw   Echo and Stress test reports in epic from 2008  EKG and CXR in epic from 04-13-13   Medical Md [place] [facility]John Engineer[facility] [place]

## 2013-07-18 NOTE — Progress Notes (Signed)
Anesthesia Chart Review:  Patient is a 73 year old female scheduled for left TKA on 07/25/13 by Dr. Jerl Santosalldorf.  History includes thyroidectomy with secondary hypothyroidism, non-smoker, hypercholesterolemia,  right TKA 04/18/13, C4-6 ACDF '13. PCP [facility] Assunta Found[facility].  EKG on 04/13/13 showed NSR, possible inferior infarct (age undetermined), ST/T wave abnormality, consider lateral ischemia.  She has at least three EKGs from 2008--one 08/19/06 (PCP) and two from 03/2007 [facility]  T wave in version in V1-4 was evidence in her EKG from 08/19/06, but more non-specific on her 03/2007 EKGs.  By report, her EKG during her 09/07/06 stress test showed, "Normal sinus rhythm; ST-T wave abnormalities consistent with inferior and anterolateral or apical ischemia or LVH."   Nuclear stress test in 09/07/06 showing abnormal stress EKG due to baseline ST segment abnormalities.  Apical thinning but no evidence of ischemia or infarct, EF > 65%. Echo on 04/25/07 showed normal LV size and systolic function, EF 55-60%, some paradoxical septal motion at the septal base, RA/RV mildly dilated, mild MR/TR.  Most recent CXR and labs noted.  Patient has had anterolateral T wave abnormality since 2008 and had a non-ischemic stress test at that time.  She tolerated right TKA since her 03/2013 EKG.  If no acute changes or new CV symptoms then I would anticipate that she could proceed as planned.  Velna Ochsllison Jarrah Babich, PA-C [facility]/Anesthesiology Phone [phone] 07/18/2013 3:39 PM

## 2013-07-21 NOTE — H&P (Signed)
TOTAL KNEE ADMISSION H&P  Patient is being admitted for left total knee arthroplasty.  Subjective:  Chief Complaint:left knee pain.  HPI: Donna Daniels, 73 y.o. female, has a history of pain and functional disability in the left knee due to arthritis and has failed non-surgical conservative treatments for greater than 12 weeks to includeNSAID's and/or analgesics, corticosteriod injections, flexibility and strengthening excercises, use of assistive devices, weight reduction as appropriate and activity modification.  Onset of symptoms was gradual, starting 6 years ago with gradually worsening course since that time. The patient noted no past surgery on the left knee(s).  Patient currently rates pain in the left knee(s) at 9 out of 10 with activity. Patient has night pain, worsening of pain with activity and weight bearing, pain that interferes with activities of daily living, pain with passive range of motion and crepitus.  Patient has evidence of subchondral sclerosis, periarticular osteophytes and joint space narrowing by imaging studies. This patient has had none. There is no active infection.  Patient Active Problem List   Diagnosis Date Noted  . 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 Stiffness of joint, not elsewhere classified, lower leg 05/03/2013  . Difficulty in walking(719.7) 05/03/2013  . CLOSED FRACTURE OF PATELLA 03/06/2010   Past Medical History  Diagnosis Date  . High cholesterol     takes SImvastin  daily  . Thyroid disorder   . PONV (postoperative nausea and vomiting)   . Arthritis   . History of bronchitis     last time about 5588yrs ago  . Joint pain   . Joint swelling   . Sciatica   . Bruises easily   . Hypothyroidism     takes Synthroid daily  . Cataracts, bilateral     Past Surgical History  Procedure Laterality Date  . Cesarean section      x 1   . Vesicovaginal fistula closure w/ tah    . Thyroidectomy    . Hand surgery Left    . Abdominal hysterectomy    . Anterior cervical decomp/discectomy fusion  12/18/2011    Procedure: ANTERIOR CERVICAL DECOMPRESSION/DISCECTOMY FUSION 2 LEVELS;  Surgeon: Reinaldo Meekerandy O Kritzer, MD;  Location: [facility];  Service: Neurosurgery;  Laterality: N/A;  Anterior cervical decompression fusion four-six  . Total knee arthroplasty Right 04/18/2013    Procedure: TOTAL KNEE ARTHROPLASTY;  Surgeon: Velna OchsPeter G Dalldorf, MD;  Location: [facility] OR;  Service: Orthopedics;  Laterality: Right;  . Colonoscopy      No prescriptions prior to admission   No Known Allergies  History  Substance Use Topics  . Smoking status: Never Smoker   . Smokeless tobacco: Not on file  . Alcohol Use: No    No family history on file.   Review of Systems  Constitutional: Negative.   HENT: Negative.   Eyes: Negative.   Respiratory: Negative.   Cardiovascular: Negative.   Gastrointestinal: Negative.   Genitourinary: Negative.   Musculoskeletal: Positive for joint pain.  Skin: Negative.   Neurological: Negative.   Endo/Heme/Allergies: Negative.   Psychiatric/Behavioral: Negative.     Objective:  Physical Exam  Constitutional: She appears well-developed.  HENT:  Head: Normocephalic.  Eyes: Pupils are equal, round, and reactive to light.  Neck: Normal range of motion.  Cardiovascular: Normal rate.   Respiratory: Effort normal.  GI: Soft.  Musculoskeletal:  Knee examleft side.: Range of motion 0-1 10.  Positive crepitation.  Pain both medial and lateral joint  line.  Hip motion full and pain-free.  No fluid on her knee.  Neurological: She is alert.  Skin: Skin is warm.    Vital signs in last 24 hours:    Labs:   Estimated body mass index is 25.21 kg/(m^2) as calculated from the following:   Height as of 04/13/13: 5\' 7"  (1.702 m).   Weight as of 04/13/13: 73.029 kg (161 lb).   Imaging Review Plain radiographs demonstrate severe degenerative joint disease of the left knee(s). The overall alignment  isneutral. The bone quality appears to be good for age and reported activity level.  Assessment/Plan:  End stage arthritis, left knee   The patient history, physical examination, clinical judgment of the provider and imaging studies are consistent with end stage degenerative joint disease of the left knee(s) and total knee arthroplasty is deemed medically necessary. The treatment options including medical management, injection therapy arthroscopy and arthroplasty were discussed at length. The risks and benefits of total knee arthroplasty were presented and reviewed. The risks due to aseptic loosening, infection, stiffness, patella tracking problems, thromboembolic complications and other imponderables were discussed. The patient acknowledged the explanation, agreed to proceed with the plan and consent was signed. Patient is being admitted for inpatient treatment for surgery, pain control, PT, OT, prophylactic antibiotics, VTE prophylaxis, progressive ambulation and ADL's and discharge planning. The patient is planning to be discharged home with home health services

## 2013-07-24 HISTORY — PX: TOTAL KNEE ARTHROPLASTY: SHX125

## 2013-07-24 MED ORDER — CEFAZOLIN SODIUM-DEXTROSE 2-3 GM-% IV SOLR
2.0000 g | INTRAVENOUS | Status: AC
  Administered 2013-07-25: 2 g via INTRAVENOUS
  Filled 2013-07-24: qty 50

## 2013-07-25 ENCOUNTER — Encounter (HOSPITAL_COMMUNITY)
Admission: RE | Disposition: A | Discharge status: Home Health | Payer: Self-pay | Source: Ambulatory Visit | Attending: Orthopaedic Surgery

## 2013-07-25 ENCOUNTER — Encounter (HOSPITAL_COMMUNITY): Payer: Medicare Other | Admitting: Vascular Surgery

## 2013-07-25 ENCOUNTER — Encounter (HOSPITAL_COMMUNITY): Payer: Self-pay | Admitting: Anesthesiology

## 2013-07-25 ENCOUNTER — Inpatient Hospital Stay (HOSPITAL_COMMUNITY): Payer: Medicare Other | Admitting: Anesthesiology

## 2013-07-25 ENCOUNTER — Inpatient Hospital Stay (HOSPITAL_COMMUNITY)
Admission: RE | Admit: 2013-07-25 | Discharge: 2013-07-27 | DRG: 470 | Disposition: A | Discharge status: Home Health | Payer: Medicare Other | Source: Ambulatory Visit | Attending: Orthopaedic Surgery | Admitting: Orthopaedic Surgery

## 2013-07-25 DIAGNOSIS — M1710 Unilateral primary osteoarthritis, unspecified knee: Principal | ICD-10-CM | POA: Diagnosis present

## 2013-07-25 DIAGNOSIS — Z96659 Presence of unspecified artificial knee joint: Secondary | ICD-10-CM

## 2013-07-25 DIAGNOSIS — E7800 Pure hypercholesterolemia, unspecified: Secondary | ICD-10-CM | POA: Diagnosis not present

## 2013-07-25 DIAGNOSIS — G8918 Other acute postprocedural pain: Secondary | ICD-10-CM | POA: Diagnosis not present

## 2013-07-25 DIAGNOSIS — E039 Hypothyroidism, unspecified: Secondary | ICD-10-CM | POA: Diagnosis present

## 2013-07-25 DIAGNOSIS — IMO0002 Reserved for concepts with insufficient information to code with codable children: Secondary | ICD-10-CM | POA: Diagnosis not present

## 2013-07-25 HISTORY — PX: TOTAL KNEE ARTHROPLASTY: SHX125

## 2013-07-25 SURGERY — ARTHROPLASTY, KNEE, TOTAL
Anesthesia: General | Site: Knee | Laterality: Left

## 2013-07-25 MED ORDER — METOCLOPRAMIDE HCL 5 MG/ML IJ SOLN: 5.0000 mg | Freq: Three times a day (TID) | INTRAMUSCULAR | Status: DC | PRN

## 2013-07-25 MED ORDER — LIDOCAINE HCL (CARDIAC) 20 MG/ML IV SOLN
INTRAVENOUS | Status: DC | PRN
  Administered 2013-07-25: 70 mg via INTRAVENOUS

## 2013-07-25 MED ORDER — ONDANSETRON HCL 4 MG/2ML IJ SOLN
INTRAMUSCULAR | Status: AC
  Filled 2013-07-25: qty 2

## 2013-07-25 MED ORDER — SODIUM CHLORIDE 0.9 % IJ SOLN
INTRAMUSCULAR | Status: AC
  Filled 2013-07-25: qty 10

## 2013-07-25 MED ORDER — FENTANYL CITRATE 0.05 MG/ML IJ SOLN
INTRAMUSCULAR | Status: DC | PRN
  Administered 2013-07-25: 100 ug via INTRAVENOUS
  Administered 2013-07-25 (×2): 50 ug via INTRAVENOUS
  Administered 2013-07-25: 100 ug via INTRAVENOUS

## 2013-07-25 MED ORDER — MORPHINE SULFATE 2 MG/ML IJ SOLN: 1.0000 mg | INTRAMUSCULAR | Status: DC | PRN

## 2013-07-25 MED ORDER — PHENOL 1.4 % MT LIQD: 1.0000 | OROMUCOSAL | Status: DC | PRN

## 2013-07-25 MED ORDER — ASPIRIN EC 325 MG PO TBEC
325.0000 mg | DELAYED_RELEASE_TABLET | Freq: Two times a day (BID) | ORAL | Status: DC
  Administered 2013-07-25 – 2013-07-27 (×4): 325 mg via ORAL
  Filled 2013-07-25 (×6): qty 1

## 2013-07-25 MED ORDER — MIDAZOLAM HCL 2 MG/2ML IJ SOLN
INTRAMUSCULAR | Status: AC
  Filled 2013-07-25: qty 2

## 2013-07-25 MED ORDER — PROPOFOL 10 MG/ML IV BOLUS
INTRAVENOUS | Status: AC
  Filled 2013-07-25: qty 20

## 2013-07-25 MED ORDER — OMEGA-3-ACID ETHYL ESTERS 1 G PO CAPS
1.0000 g | ORAL_CAPSULE | Freq: Three times a day (TID) | ORAL | Status: DC
  Administered 2013-07-25 – 2013-07-27 (×6): 1 g via ORAL
  Filled 2013-07-25 (×9): qty 1

## 2013-07-25 MED ORDER — PHENYLEPHRINE 40 MCG/ML (10ML) SYRINGE FOR IV PUSH (FOR BLOOD PRESSURE SUPPORT)
PREFILLED_SYRINGE | INTRAVENOUS | Status: AC
  Filled 2013-07-25: qty 10

## 2013-07-25 MED ORDER — LIDOCAINE HCL (CARDIAC) 20 MG/ML IV SOLN
INTRAVENOUS | Status: AC
  Filled 2013-07-25: qty 5

## 2013-07-25 MED ORDER — SODIUM CHLORIDE 0.9 % IR SOLN
Status: DC | PRN
  Administered 2013-07-25: 1000 mL

## 2013-07-25 MED ORDER — PHENYLEPHRINE HCL 10 MG/ML IJ SOLN
INTRAMUSCULAR | Status: DC | PRN
  Administered 2013-07-25 (×5): 80 ug via INTRAVENOUS

## 2013-07-25 MED ORDER — FENTANYL CITRATE 0.05 MG/ML IJ SOLN
INTRAMUSCULAR | Status: AC
  Filled 2013-07-25: qty 5

## 2013-07-25 MED ORDER — CEFAZOLIN SODIUM 1-5 GM-% IV SOLN
1.0000 g | Freq: Four times a day (QID) | INTRAVENOUS | Status: AC
  Administered 2013-07-25 (×2): 1 g via INTRAVENOUS
  Filled 2013-07-25 (×2): qty 50

## 2013-07-25 MED ORDER — HYDROMORPHONE HCL PF 1 MG/ML IJ SOLN: 0.2500 mg | INTRAMUSCULAR | Status: DC | PRN

## 2013-07-25 MED ORDER — ONDANSETRON HCL 4 MG PO TABS: 4.0000 mg | ORAL_TABLET | Freq: Four times a day (QID) | ORAL | Status: DC | PRN

## 2013-07-25 MED ORDER — ONDANSETRON HCL 4 MG/2ML IJ SOLN: 4.0000 mg | Freq: Four times a day (QID) | INTRAMUSCULAR | Status: DC | PRN

## 2013-07-25 MED ORDER — ONDANSETRON HCL 4 MG/2ML IJ SOLN
INTRAMUSCULAR | Status: DC | PRN
  Administered 2013-07-25: 4 mg via INTRAVENOUS

## 2013-07-25 MED ORDER — SIMVASTATIN 40 MG PO TABS
40.0000 mg | ORAL_TABLET | Freq: Every evening | ORAL | Status: DC
  Administered 2013-07-25 – 2013-07-26 (×2): 40 mg via ORAL
  Filled 2013-07-25 (×3): qty 1

## 2013-07-25 MED ORDER — DEXAMETHASONE SODIUM PHOSPHATE 10 MG/ML IJ SOLN
INTRAMUSCULAR | Status: DC | PRN
  Administered 2013-07-25: 6 mg

## 2013-07-25 MED ORDER — PROPOFOL 10 MG/ML IV BOLUS
INTRAVENOUS | Status: DC | PRN
  Administered 2013-07-25: 150 mg via INTRAVENOUS

## 2013-07-25 MED ORDER — ACETAMINOPHEN 325 MG PO TABS: 650.0000 mg | ORAL_TABLET | Freq: Four times a day (QID) | ORAL | Status: DC | PRN

## 2013-07-25 MED ORDER — DEXTROSE IN LACTATED RINGERS 5 % IV SOLN
INTRAVENOUS | Status: DC
Start: 2013-07-25 — End: 2013-07-26
  Administered 2013-07-25: 13:00:00 via INTRAVENOUS

## 2013-07-25 MED ORDER — ACETAMINOPHEN 650 MG RE SUPP: 650.0000 mg | Freq: Four times a day (QID) | RECTAL | Status: DC | PRN

## 2013-07-25 MED ORDER — BUPIVACAINE-EPINEPHRINE PF 0.5-1:200000 % IJ SOLN
INTRAMUSCULAR | Status: DC | PRN
  Administered 2013-07-25: 125 mg via PERINEURAL

## 2013-07-25 MED ORDER — ARTIFICIAL TEARS OP OINT
TOPICAL_OINTMENT | OPHTHALMIC | Status: AC
Start: 2013-07-25 — End: 2013-07-25
  Filled 2013-07-25: qty 3.5

## 2013-07-25 MED ORDER — LEVOTHYROXINE SODIUM 100 MCG PO TABS
100.0000 ug | ORAL_TABLET | Freq: Every day | ORAL | Status: DC
  Administered 2013-07-26 – 2013-07-27 (×2): 100 ug via ORAL
  Filled 2013-07-25 (×3): qty 1

## 2013-07-25 MED ORDER — LACTATED RINGERS IV SOLN
INTRAVENOUS | Status: DC
  Administered 2013-07-25 (×2): via INTRAVENOUS

## 2013-07-25 MED ORDER — HYDROCODONE-ACETAMINOPHEN 5-325 MG PO TABS
1.0000 | ORAL_TABLET | ORAL | Status: DC | PRN
  Administered 2013-07-26 (×2): 2 via ORAL
  Administered 2013-07-27: 1 via ORAL
  Administered 2013-07-27 (×2): 2 via ORAL
  Filled 2013-07-25: qty 2
  Filled 2013-07-25: qty 1
  Filled 2013-07-25 (×3): qty 2

## 2013-07-25 MED ORDER — METOCLOPRAMIDE HCL 10 MG PO TABS: 5.0000 mg | ORAL_TABLET | Freq: Three times a day (TID) | ORAL | Status: DC | PRN

## 2013-07-25 MED ORDER — MIDAZOLAM HCL 5 MG/5ML IJ SOLN
INTRAMUSCULAR | Status: DC | PRN
  Administered 2013-07-25: 1 mg via INTRAVENOUS

## 2013-07-25 MED ORDER — ADULT MULTIVITAMIN W/MINERALS CH
1.0000 | ORAL_TABLET | Freq: Every day | ORAL | Status: DC
  Administered 2013-07-25 – 2013-07-27 (×3): 1 via ORAL
  Filled 2013-07-25 (×3): qty 1

## 2013-07-25 MED ORDER — ONDANSETRON HCL 4 MG/2ML IJ SOLN: 4.0000 mg | Freq: Once | INTRAMUSCULAR | Status: DC | PRN

## 2013-07-25 MED ORDER — EPHEDRINE SULFATE 50 MG/ML IJ SOLN
INTRAMUSCULAR | Status: AC
Start: 2013-07-25 — End: 2013-07-25
  Filled 2013-07-25: qty 1

## 2013-07-25 MED ORDER — MENTHOL 3 MG MT LOZG
1.0000 | LOZENGE | OROMUCOSAL | Status: DC | PRN
Start: 2013-07-25 — End: 2013-07-27

## 2013-07-25 SURGICAL SUPPLY — 67 items
BANDAGE ELASTIC 4 VELCRO ST LF (GAUZE/BANDAGES/DRESSINGS) ×2 IMPLANT
BANDAGE ELASTIC 6 VELCRO ST LF (GAUZE/BANDAGES/DRESSINGS) ×1 IMPLANT
BANDAGE ESMARK 6X9 LF (GAUZE/BANDAGES/DRESSINGS) ×1 IMPLANT
BANDAGE GAUZE ELAST BULKY 4 IN (GAUZE/BANDAGES/DRESSINGS) ×4 IMPLANT
BLADE SAGITTAL 25.0X1.19X90 (BLADE) ×2 IMPLANT
BLADE SURG ROTATE 9660 (MISCELLANEOUS) IMPLANT
BNDG CMPR 9X6 STRL LF SNTH (GAUZE/BANDAGES/DRESSINGS) ×1
BNDG CMPR MED 10X6 ELC LF (GAUZE/BANDAGES/DRESSINGS) ×1
BNDG ELASTIC 6X10 VLCR STRL LF (GAUZE/BANDAGES/DRESSINGS) ×2 IMPLANT
BNDG ESMARK 6X9 LF (GAUZE/BANDAGES/DRESSINGS) ×2
BNDG GAUZE ELAST 4 BULKY (GAUZE/BANDAGES/DRESSINGS) ×1 IMPLANT
BOWL SMART MIX CTS (DISPOSABLE) ×2 IMPLANT
CAPT RP KNEE ×1 IMPLANT
CEMENT HV SMART SET (Cement) ×4 IMPLANT
CLOTH BEACON ORANGE TIMEOUT ST (SAFETY) ×2 IMPLANT
COVER SURGICAL LIGHT HANDLE (MISCELLANEOUS) ×2 IMPLANT
CUFF TOURNIQUET SINGLE 34IN LL (TOURNIQUET CUFF) ×2 IMPLANT
CUFF TOURNIQUET SINGLE 44IN (TOURNIQUET CUFF) IMPLANT
DRAPE EXTREMITY T 121X128X90 (DRAPE) ×2 IMPLANT
DRAPE PROXIMA HALF (DRAPES) ×2 IMPLANT
DRAPE U-SHAPE 47X51 STRL (DRAPES) ×2 IMPLANT
DRSG ADAPTIC 3X8 NADH LF (GAUZE/BANDAGES/DRESSINGS) ×2 IMPLANT
DRSG PAD ABDOMINAL 8X10 ST (GAUZE/BANDAGES/DRESSINGS) ×2 IMPLANT
DURAPREP 26ML APPLICATOR (WOUND CARE) ×2 IMPLANT
ELECT REM PT RETURN 9FT ADLT (ELECTROSURGICAL) ×2
ELECTRODE REM PT RTRN 9FT ADLT (ELECTROSURGICAL) ×1 IMPLANT
FACESHIELD LNG OPTICON STERILE (SAFETY) ×4 IMPLANT
GLOVE BIO SURGEON STRL SZ8.5 (GLOVE) ×2 IMPLANT
GLOVE BIOGEL PI IND STRL 8 (GLOVE) ×1 IMPLANT
GLOVE BIOGEL PI IND STRL 8.5 (GLOVE) ×1 IMPLANT
GLOVE BIOGEL PI INDICATOR 8 (GLOVE) ×1
GLOVE BIOGEL PI INDICATOR 8.5 (GLOVE) ×1
GLOVE SS BIOGEL STRL SZ 8 (GLOVE) ×1 IMPLANT
GLOVE SUPERSENSE BIOGEL SZ 8 (GLOVE) ×1
GOWN PREVENTION PLUS XLARGE (GOWN DISPOSABLE) ×2 IMPLANT
GOWN STRL NON-REIN LRG LVL3 (GOWN DISPOSABLE) ×2 IMPLANT
GOWN STRL REUS W/TWL 2XL LVL3 (GOWN DISPOSABLE) ×2 IMPLANT
HANDPIECE INTERPULSE COAX TIP (DISPOSABLE) ×2
HOOD PEEL AWAY FACE SHEILD DIS (HOOD) ×2 IMPLANT
IMMOBILIZER KNEE 20 (SOFTGOODS) IMPLANT
IMMOBILIZER KNEE 22 UNIV (SOFTGOODS) ×2 IMPLANT
IMMOBILIZER KNEE 24 THIGH 36 (MISCELLANEOUS) IMPLANT
IMMOBILIZER KNEE 24 UNIV (MISCELLANEOUS)
KIT BASIN OR (CUSTOM PROCEDURE TRAY) ×2 IMPLANT
KIT ROOM TURNOVER OR (KITS) ×2 IMPLANT
MANIFOLD NEPTUNE II (INSTRUMENTS) ×2 IMPLANT
NDL HYPO 21X1 ECLIPSE (NEEDLE) ×1 IMPLANT
NEEDLE HYPO 21X1 ECLIPSE (NEEDLE) ×2 IMPLANT
NS IRRIG 1000ML POUR BTL (IV SOLUTION) ×2 IMPLANT
PACK TOTAL JOINT (CUSTOM PROCEDURE TRAY) ×2 IMPLANT
PAD ARMBOARD 7.5X6 YLW CONV (MISCELLANEOUS) ×4 IMPLANT
SET HNDPC FAN SPRY TIP SCT (DISPOSABLE) ×1 IMPLANT
SPONGE GAUZE 4X4 12PLY (GAUZE/BANDAGES/DRESSINGS) ×2 IMPLANT
SPONGE GAUZE 4X4 12PLY STER LF (GAUZE/BANDAGES/DRESSINGS) ×1 IMPLANT
STAPLER VISISTAT 35W (STAPLE) IMPLANT
SUCTION FRAZIER TIP 10 FR DISP (SUCTIONS) IMPLANT
SUT MNCRL AB 3-0 PS2 18 (SUTURE) IMPLANT
SUT VIC AB 0 CT1 27 (SUTURE) ×4
SUT VIC AB 0 CT1 27XBRD ANBCTR (SUTURE) ×2 IMPLANT
SUT VIC AB 2-0 CT1 27 (SUTURE) ×4
SUT VIC AB 2-0 CT1 TAPERPNT 27 (SUTURE) ×2 IMPLANT
SUT VLOC 180 0 24IN GS25 (SUTURE) ×2 IMPLANT
SYR 50ML LL SCALE MARK (SYRINGE) ×2 IMPLANT
TOWEL OR 17X24 6PK STRL BLUE (TOWEL DISPOSABLE) ×2 IMPLANT
TOWEL OR 17X26 10 PK STRL BLUE (TOWEL DISPOSABLE) ×2 IMPLANT
TRAY FOLEY CATH 14FR (SET/KITS/TRAYS/PACK) ×1 IMPLANT
WATER STERILE IRR 1000ML POUR (IV SOLUTION) ×2 IMPLANT

## 2013-07-25 NOTE — Care Management Note (Signed)
CARE MANAGEMENT NOTE 07/25/2013  Patient:  Donna Daniels,[name]   Account Number:  [id number]  Date Initiated:  07/25/2013  Documentation initiated by:  Vance PeperBRADY,Clemence Lengyel  Subjective/Objective Assessment:   73 yr old female s/p left total knee arthroplasty.     Action/Plan:   PT/OT eval.  Patient preoperatively setup with [facility]. CM will folow   Anticipated DC Date:     Anticipated DC Plan:           Choice offered to / List presented to:             Status of service:  In process, will continue to follow

## 2013-07-25 NOTE — Progress Notes (Signed)
Orthopedic Tech Progress Note Patient Details:  Donna SchmidtCarolyn P Daniels September 23, 1940 [id number] Placed patient in CPM for 1500 rounds CPM Left Knee CPM Left Knee: On Left Knee Flexion (Degrees): 60 Left Knee Extension (Degrees): 0   Asia R Thompson 07/25/2013, 3:31 PM

## 2013-07-25 NOTE — Progress Notes (Signed)
UR completed 

## 2013-07-25 NOTE — Preoperative (Signed)
Beta Blockers   Reason not to administer Beta Blockers:Not Applicable 

## 2013-07-25 NOTE — Progress Notes (Signed)
Orthopedic Tech Progress Note Patient Details:  Gilman SchmidtCarolyn P Rae 16-Jan-1941 [id number] CPM delivered to patient's room for application but patient had just finished working with PT and was sitting in chair eating lunch. Once patient has final session for the day with PT and is back in bed CPM will be applied. OHF applied to bed.  CPM Left Knee CPM Left Knee: Off   Asia Burnett KanarisR Thompson 07/25/2013, 12:17 PM

## 2013-07-25 NOTE — Transfer of Care (Signed)
Immediate Anesthesia Transfer of Care Note  Patient: Donna SchmidtCarolyn P Daniels  Procedure(s) Performed: Procedure(s): TOTAL KNEE ARTHROPLASTY (Left)  Patient Location: PACU  Anesthesia Type:General  Level of Consciousness: awake, alert  and oriented  Airway & Oxygen Therapy: Patient Spontanous Breathing and Patient connected to nasal cannula oxygen  Post-op Assessment: Report given to PACU RN and Post -op Vital signs reviewed and stable  Post vital signs: Reviewed and stable  Complications: No apparent anesthesia complications

## 2013-07-25 NOTE — Plan of Care (Signed)
Problem: Consults Goal: Diagnosis- Total Joint Replacement Primary Total Knee Left     

## 2013-07-25 NOTE — Anesthesia Procedure Notes (Addendum)
Anesthesia Regional Block:  Femoral nerve block  Pre-Anesthetic Checklist: ,, timeout performed, Correct Patient, Correct Site, Correct Laterality, Correct Procedure, Correct Position, site marked, Risks and benefits discussed,  Surgical consent,  Pre-op evaluation,  At surgeon's request and post-op pain management  Laterality: Left  Prep: chloraprep       Needles:  Injection technique: Single-shot  Needle Type: Echogenic Stimulator Needle     Needle Length: 5cm 5 cm Needle Gauge: 22 and 22 G    Additional Needles:  Procedures: ultrasound guided (picture in chart) and nerve stimulator Femoral nerve block  Nerve Stimulator or Paresthesia:  Response: quadraceps contraction, 0.45 mA,   Additional Responses:   Narrative:  Start time: 07/25/2013 6:56 AM End time: 07/25/2013 7:06 AM Injection made incrementally with aspirations every 5 mL.  Performed by: Personally  Anesthesiologist: Halford DecampJ. Daniel Singer, MD  Additional Notes: Functioning IV was confirmed and monitors were applied.  A 50mm 22ga Arrow echogenic stimulator needle was used. Sterile prep and drape,hand hygiene and sterile gloves were used. Ultrasound guidance: relevant anatomy identified, needle position confirmed, local anesthetic spread visualized around nerve(s)., vascular puncture avoided.  Image printed for medical record. Negative aspiration and negative test dose prior to incremental administration of local anesthetic. The patient tolerated the procedure well.     Procedure Name: LMA Insertion Date/Time: 07/25/2013 7:34 AM Performed by: Marena ChancyBECKNER, Cortne Amara S Pre-anesthesia Checklist: Patient identified, Patient being monitored, Timeout performed, Emergency Drugs available and Suction available Patient Re-evaluated:Patient Re-evaluated prior to inductionOxygen Delivery Method: Circle system utilized Preoxygenation: Pre-oxygenation with 100% oxygen Intubation Type: IV induction LMA: LMA inserted LMA Size:  3.0 Placement Confirmation: breath sounds checked- equal and bilateral and positive ETCO2 Tube secured with: Tape Dental Injury: Teeth and Oropharynx as per pre-operative assessment

## 2013-07-25 NOTE — Anesthesia Preprocedure Evaluation (Signed)
Anesthesia Evaluation  Patient identified by MRN, date of birth, ID band Patient awake    Reviewed: Allergy & Precautions, H&P , NPO status , Patient's Chart, lab work & pertinent test results  History of Anesthesia Complications (+) PONV  Airway       Dental   Pulmonary          Cardiovascular     Neuro/Psych  Neuromuscular disease    GI/Hepatic   Endo/Other  Hypothyroidism   Renal/GU      Musculoskeletal   Abdominal   Peds  Hematology   Anesthesia Other Findings   Reproductive/Obstetrics                           Anesthesia Physical Anesthesia Plan  ASA: II  Anesthesia Plan: General   Post-op Pain Management:    Induction: Intravenous  Airway Management Planned: Oral ETT and LMA  Additional Equipment:   Intra-op Plan:   Post-operative Plan: Extubation in OR  Informed Consent: I have reviewed the patients History and Physical, chart, labs and discussed the procedure including the risks, benefits and alternatives for the proposed anesthesia with the patient or authorized representative who has indicated his/her understanding and acceptance.     Plan Discussed with:   Anesthesia Plan Comments:         Anesthesia Quick Evaluation

## 2013-07-25 NOTE — Evaluation (Signed)
Physical Therapy Evaluation Patient Details Name: Donna MarvelCarolyn P Daniels MRN: [id number] DOB: [birth year] Today's Date: 07/25/2013 Time: [id number] PT Time Calculation (min): 16 min  PT Assessment / Plan / Recommendation History of Present Illness  Pt is a 73 y/o female admitted s/p R TKA.  Clinical Impression  Pt is s/p TKA resulting in the deficits listed below (see PT Problem List).  Pt will benefit from skilled PT to increase their independence and safety with mobility to allow discharge to the venue listed below.      PT Assessment  Patient needs continued PT services    Follow Up Recommendations  Home health PT    Does the patient have the potential to tolerate intense rehabilitation      Barriers to Discharge        Equipment Recommendations  None recommended by PT    Recommendations for Other Services OT consult   Frequency 7X/week    Precautions / Restrictions Precautions Precautions: Knee Required Braces or Orthoses: Knee Immobilizer - Left Knee Immobilizer - Left: On when out of bed or walking Restrictions LLE Weight Bearing: Weight bearing as tolerated   Pertinent Vitals/Pain Minimal pain; pt attributes this to the nerve block patient repositioned for comfort and optimal knee ext      Mobility  Bed Mobility Overal bed mobility: Needs Assistance Bed Mobility: Supine to Sit Supine to sit: Min guard General bed mobility comments: Cues for technique Transfers Overall transfer level: Needs assistance Equipment used: Rolling walker (2 wheeled) Transfers: Sit to/from Stand Sit to Stand: Min assist General transfer comment: Cues for safety, ahnd placement, and technique Ambulation/Gait Ambulation/Gait assistance: Min assist Ambulation Distance (Feet): 6 Feet Assistive device: Rolling walker (2 wheeled) Gait Pattern/deviations: Step-to pattern General Gait Details: Cues for gait sequence and to activate L quad for stance stability; Noted tendency for L knee to  buckle during stance    Exercises Total Joint Exercises Quad Sets: AROM;Left;5 reps Heel Slides: AROM;Left;5 reps Straight Leg Raises: AAROM;Left;5 reps Goniometric ROM: 0-85   PT Diagnosis: Difficulty walking;Acute pain  PT Problem List: Decreased strength;Decreased range of motion;Decreased activity tolerance;Decreased balance;Decreased mobility;Decreased knowledge of use of DME;Pain PT Treatment Interventions: DME instruction;Gait training;Stair training;Functional mobility training;Therapeutic activities;Patient/family education;Therapeutic exercise     PT Goals(Current goals can be found in the care plan section) Acute Rehab PT Goals Patient Stated Goal: walk PT Goal Formulation: With patient Time For Goal Achievement: 08/01/13 Potential to Achieve Goals: Good  Visit Information  Last PT Received On: 07/25/13 Assistance Needed: +1 History of Present Illness: Pt is a 73 y/o female admitted s/p R TKA.       Prior Functioning  Home Living Family/patient expects to be discharged to:: Private residence Living Arrangements: Children (plans to go to dtr's home) Available Help at Discharge: Family;Available 24 hours/day Type of Home: House Home Access: Stairs to enter Entergy CorporationEntrance Stairs-Number of Steps: 2 Entrance Stairs-Rails: None Home Layout: Able to live on main level with bedroom/bathroom Home Equipment: Bedside commode;Walker - 2 wheels;Toilet riser;Shower seat Additional Comments: Pt plans to go to daughter's home upon d/c which has 2 levels, but pt plans to stay on the first floor with a half bath and will go to her home to shower in her walk in shower. Prior Function Level of Independence: Independent Comments: yardwork, walk Communication Communication: No difficulties Dominant Hand: Right    Cognition  Cognition Arousal/Alertness: Awake/alert Behavior During Therapy: WFL for tasks assessed/performed Overall Cognitive Status: Within Functional Limits for tasks  assessed  Extremity/Trunk Assessment Upper Extremity Assessment Upper Extremity Assessment: Overall WFL for tasks assessed Lower Extremity Assessment Lower Extremity Assessment: LLE deficits/detail LLE Deficits / Details: Decr AROM and strength, limited by nerve block; Quad set present; significant quad lag with SLR   Balance    End of Session PT - End of Session Equipment Utilized During Treatment: Left knee immobilizer Activity Tolerance: Patient tolerated treatment well Patient left: in chair;with call bell/phone within reach;with family/visitor present Nurse Communication: Mobility status CPM Left Knee CPM Left Knee: On Left Knee Flexion (Degrees): 60 Left Knee Extension (Degrees): 0  GP     Olen Pel [place], [place] [phone]  07/25/2013, 4:14 PM

## 2013-07-25 NOTE — Op Note (Signed)
PREOP DIAGNOSIS: DJD LEFT KNEE POSTOP DIAGNOSIS:  same PROCEDURE: LEFT TKR ANESTHESIA: General and block ATTENDING SURGEON: Devory Mckinzie G ASSISTANT: Dirk Dress OPA and April Green RNFA  INDICATIONS FOR PROCEDURE: Donna Daniels is a 73 y.o. female who has struggled for a long time with pain due to degenerative arthritis of the left knee.  The patient has failed many conservative non-operative measures and at this point has pain which limits the ability to sleep and walk.  The patient is offered total knee replacement.  Informed operative consent was obtained after discussion of possible risks of anesthesia, infection, neurovascular injury, DVT, and death.  The importance of the post-operative rehabilitation protocol to optimize result was stressed extensively with the patient.  SUMMARY OF FINDINGS AND PROCEDURE:  Donna Daniels was taken to the operative suite where under the above anesthesia a left knee replacement was performed.  There were advanced degenerative changes and the bone quality was good.  We used the DePuy system and placed size standard femur, 3 tibia, 35 mm all polyethylene patella, and a size 10 mm spacer.  The patient was admitted for appropriate post-op care to include perioperative antibiotics and mechanical and pharmacologic measures for DVT prophylaxis.  DESCRIPTION OF PROCEDURE:  Donna Daniels was taken to the operative suite where the above anesthesia was applied.  The patient was positioned supine and prepped and draped in normal sterile fashion.  An appropriate time out was performed.  After the administration of Kefzol pre-op antibiotic the leg was elevated and exsanguinated and a tourniquet inflated.  A standard longitudinal incision was made on the anterior knee.  Dissection was carried down to the extensor mechanism.  All appropriate anti-infective measures were used including the pre-operative antibiotic, betadine impregnated drape, and closed hooded exhaust  systems for each member of the surgical team.  A medial parapatellar incision was made in the extensor mechanism and the knee cap flipped and the knee flexed.  Some residual meniscal tissues were removed along with any remaining ACL/PCL tissue.  A guide was placed on the tibia and a flat cut was made on it's superior surface.  An intramedullary guide was placed in the femur and was utilized to make anterior and posterior cuts creating an appropriate flexion gap.  A second intramedullary guide was placed in the femur to make a distal cut properly balancing the knee with an extension gap equal to the flexion gap.  The three bones sized to the above mentioned sizes and the appropriate guides were placed and utilized.  A trial reduction was done and the knee easily came to full extension and the patella tracked well on flexion.  The trial components were removed and all bones were cleaned with pulsatile lavage and then dried thoroughly.  Cement was mixed and was pressurized onto the bones followed by placement of the aforementioned components.  Excess cement was trimmed and pressure was held on the components until the cement had hardened.  The tourniquet was deflated and a small amount of bleeding was controlled with cautery and pressure.  The knee was irrigated thoroughly.  The extensor mechanism was re-approximated with V-loc suture in running fashion.  The knee was flexed and the repair was solid.  The subcutaneous tissues were re-approximated with #0 and #2-0 vicryl and the skin closed with a subcuticular stitch and steristrips.  A sterile dressing was applied.  Intraoperative fluids, EBL, and tourniquet time can be obtained from anesthesia records.  DISPOSITION:  The patient was taken to  recovery room in stable condition and admitted for appropriate post-op care to include peri-operative antibiotic and DVT prophylaxis with mechanical and pharmacologic measures.  Mumin Denomme G 07/25/2013, 9:00 AM

## 2013-07-25 NOTE — Interval H&P Note (Signed)
History and Physical Interval Note:  07/25/2013 7:24 AM  Donna Daniels  has presented today for surgery, with the diagnosis of LEFT KNEE DEGENERATIVE JOINT DISEASE  The various methods of treatment have been discussed with the patient and family. After consideration of risks, benefits and other options for treatment, the patient has consented to  Procedure(s): TOTAL KNEE ARTHROPLASTY (Left) as a surgical intervention .  The patient's history has been reviewed, patient examined, no change in status, stable for surgery.  I have reviewed the patient's chart and labs.  Questions were answered to the patient's satisfaction.     Charleton Deyoung G

## 2013-07-25 NOTE — Anesthesia Postprocedure Evaluation (Signed)
  Anesthesia Post-op Note  Patient: Donna Daniels  Procedure(s) Performed: Procedure(s): TOTAL KNEE ARTHROPLASTY (Left)  Patient Location: PACU  Anesthesia Type:GA combined with regional for post-op pain  Level of Consciousness: awake, alert , oriented and patient cooperative  Airway and Oxygen Therapy: Patient Spontanous Breathing  Post-op Pain: mild  Post-op Assessment: Post-op Vital signs reviewed, Patient's Cardiovascular Status Stable, Respiratory Function Stable, Patent Airway, No signs of Nausea or vomiting and Pain level controlled  Post-op Vital Signs: stable  Complications: No apparent anesthesia complications

## 2013-07-26 LAB — CBC
HCT: 32.1 % — ABNORMAL LOW (ref 36.0–46.0)
Hemoglobin: 10.9 g/dL — ABNORMAL LOW (ref 12.0–15.0)
MCH: 30.3 pg (ref 26.0–34.0)
MCHC: 34 g/dL (ref 30.0–36.0)
MCV: 89.2 fL (ref 78.0–100.0)
PLATELETS: 211 10*3/uL (ref 150–400)
RBC: 3.6 MIL/uL — ABNORMAL LOW (ref 3.87–5.11)
RDW: 13.3 % (ref 11.5–15.5)
WBC: 15.2 10*3/uL — AB (ref 4.0–10.5)

## 2013-07-26 LAB — BASIC METABOLIC PANEL
BUN: 14 mg/dL (ref 6–23)
CHLORIDE: 105 meq/L (ref 96–112)
CO2: 26 mEq/L (ref 19–32)
Calcium: 8.9 mg/dL (ref 8.4–10.5)
Creatinine, Ser: 0.74 mg/dL (ref 0.50–1.10)
GFR calc non Af Amer: 83 mL/min — ABNORMAL LOW (ref >90)
Glucose, Bld: 150 mg/dL — ABNORMAL HIGH (ref 70–99)
Potassium: 4.3 mEq/L (ref 3.7–5.3)
Sodium: 143 mEq/L (ref 137–147)

## 2013-07-26 MED ORDER — METHOCARBAMOL 500 MG PO TABS
500.0000 mg | ORAL_TABLET | Freq: Four times a day (QID) | ORAL | Status: DC | PRN
  Administered 2013-07-26 – 2013-07-27 (×3): 500 mg via ORAL
  Filled 2013-07-26 (×2): qty 1

## 2013-07-26 NOTE — Progress Notes (Signed)
Subjective: 1 Day Post-Op Procedure(s) (LRB): TOTAL KNEE ARTHROPLASTY (Left) Doing well so far. Plan is to go home Thursday if cleared by therapy. Activity level:  Weightbearing as tolerated Diet tolerance:  ok Voiding:  ok Patient reports pain as 2 on 0-10 scale.    Objective: Vital signs in last 24 hours: Temp:  [97.2 F (36.2 C)-98.8 F (37.1 C)] 97.5 F (36.4 C) (01/28 0515) Pulse Rate:  [90-109] 109 (01/28 0515) Resp:  [11-18] 16 (01/28 0515) BP: (103-142)/(56-81) 127/64 mmHg (01/28 0515) SpO2:  [94 %-99 %] 97 % (01/28 0515) FiO2 (%):  [28 %] 28 % (01/27 1041)  Labs:  Recent Labs  07/26/13 0700  HGB 10.9*    Recent Labs  07/26/13 0700  WBC 15.2*  RBC 3.60*  HCT 32.1*  PLT 211   No results found for this basename: NA, K, CL, CO2, BUN, CREATININE, GLUCOSE, CALCIUM,  in the last 72 hours No results found for this basename: LABPT, INR,  in the last 72 hours  Physical Exam:  Neurologically intact ABD soft Neurovascular intact Sensation intact distally Intact pulses distally Dorsiflexion/Plantar flexion intact Incision: dressing C/D/I No cellulitis present Compartment soft  Assessment/Plan:  1 Day Post-Op Procedure(s) (LRB): TOTAL KNEE ARTHROPLASTY (Left) Advance diet Up with therapy D/C IV fluids Plan for discharge tomorrow passes therapy. Change dressing today. Continue ASA 325 one twice a day x2 weeks. SCDs and incentive spirometry    Annalee Meyerhoff R 07/26/2013, 8:22 AM

## 2013-07-26 NOTE — Progress Notes (Signed)
Physical Therapy Treatment Patient Details Name: Donna Daniels MRN: [id number] DOB: [birth year] Today's Date: 07/26/2013 Time: [id number] PT Time Calculation (min): 23 min  PT Assessment / Plan / Recommendation  History of Present Illness Pt is a 73 y/o female admitted s/p R TKA.   PT Comments   Pt making good progress with mobility/PT goals.  Ambulated ~100' with RW.  Cont with current POC to maximize functional mobility & safety prior to d/c home.    Follow Up Recommendations  Home health PT     Does the patient have the potential to tolerate intense rehabilitation     Barriers to Discharge        Equipment Recommendations  None recommended by PT    Recommendations for Other Services    Frequency 7X/week   Progress towards PT Goals Progress towards PT goals: Progressing toward goals  Plan Current plan remains appropriate    Precautions / Restrictions Precautions Precautions: Knee Required Braces or Orthoses: Knee Immobilizer - Left Knee Immobilizer - Left: On when out of bed or walking Restrictions LLE Weight Bearing: Weight bearing as tolerated   Pertinent Vitals/Pain "it's not hurting, just sore".      Mobility  Bed Mobility Overal bed mobility: Modified Independent Bed Mobility: Supine to Sit Transfers Overall transfer level: Needs assistance Equipment used: Rolling walker (2 wheeled) Transfers: Sit to/from Stand Sit to Stand: Supervision General transfer comment: cues for safe hand placement Ambulation/Gait Ambulation/Gait assistance: Min guard Ambulation Distance (Feet): 100 Feet Assistive device: Rolling walker (2 wheeled) Gait Pattern/deviations: Step-through pattern;Decreased weight shift to left General Gait Details: cues for sequencing, safety, & to slow down Rt step length as she has tendency to take quick step    Exercises Total Joint Exercises Ankle Circles/Pumps: AROM;Both;10 reps Quad Sets: AROM;Both;10 reps Heel Slides:  AROM;Strengthening;Left;10 reps Hip ABduction/ADduction: AROM;Left;10 reps Straight Leg Raises: AAROM;Left;10 reps     PT Goals (current goals can now be found in the care plan section) Acute Rehab PT Goals Patient Stated Goal: walk PT Goal Formulation: With patient Time For Goal Achievement: 08/01/13 Potential to Achieve Goals: Good  Visit Information  Last PT Received On: 07/26/13 Assistance Needed: +1 History of Present Illness: Pt is a 73 y/o female admitted s/p R TKA.    Subjective Data  Patient Stated Goal: walk   Cognition  Cognition Arousal/Alertness: Awake/alert Behavior During Therapy: WFL for tasks assessed/performed Overall Cognitive Status: Within Functional Limits for tasks assessed    Balance     End of Session PT - End of Session Equipment Utilized During Treatment: Left knee immobilizer Activity Tolerance: Patient tolerated treatment well Patient left: in chair;with call bell/phone within reach Nurse Communication: Mobility status   GP     Lara MulchCooper, Ryo Klang Lynn 07/26/2013, 10:57 AM  Verdell FaceKelly Budd [place], PTA [phone] 07/26/2013

## 2013-07-26 NOTE — Progress Notes (Signed)
Physical Therapy Treatment Patient Details Name: Donna Daniels MRN: [id number] DOB: 1940-12-17 Today's Date: 07/26/2013 Time: [id number] PT Time Calculation (min): 23 min  PT Assessment / Plan / Recommendation  History of Present Illness Pt is a 73 y/o female admitted s/p R TKA.   PT Comments   Pt cont's to move very well.  Increased ambulation distance & completed stair training this session.  Pt safe to d/c home from mobility standpoint when MD feels medically ready.     Follow Up Recommendations  Home health PT     Does the patient have the potential to tolerate intense rehabilitation     Barriers to Discharge        Equipment Recommendations  None recommended by PT    Recommendations for Other Services OT consult  Frequency 7X/week   Progress towards PT Goals Progress towards PT goals: Progressing toward goals  Plan Current plan remains appropriate    Precautions / Restrictions Precautions Precautions: Knee Required Braces or Orthoses: Knee Immobilizer - Left Knee Immobilizer - Left: On when out of bed or walking Restrictions Weight Bearing Restrictions: Yes LLE Weight Bearing: Weight bearing as tolerated       Mobility  Bed Mobility Overal bed mobility: Modified Independent Bed Mobility: Supine to Sit;Sit to Supine Supine to sit: Modified independent (Device/Increase time) Sit to supine: Modified independent (Device/Increase time) Transfers Overall transfer level: Modified independent Equipment used: Rolling walker (2 wheeled) Transfers: Sit to/from Stand Sit to Stand: Modified independent (Device/Increase time) General transfer comment: cues for safe hand placement Ambulation/Gait Ambulation/Gait assistance: Supervision Ambulation Distance (Feet): 300 Feet Assistive device: Rolling walker (2 wheeled) Gait Pattern/deviations: Step-through pattern;Decreased stride length General Gait Details: Pt is steady but cues to slow down to moving quickly Stairs:  Yes Stairs assistance: Min guard Stair Management: One rail Right;Step to pattern Number of Stairs: 3 General stair comments: cues for sequencing & technique.  Pt used rail on Rt side to simulate holding onto doorframe at home.  Pt's daughter present for education      PT Goals (current goals can now be found in the care plan section) Acute Rehab PT Goals Patient Stated Goal: Go home PT Goal Formulation: With patient Time For Goal Achievement: 08/01/13 Potential to Achieve Goals: Good  Visit Information  Last PT Received On: 07/26/13 Assistance Needed: +1 History of Present Illness: Pt is a 73 y/o female admitted s/p R TKA.    Subjective Data  Patient Stated Goal: Go home   Cognition  Cognition Arousal/Alertness: Awake/alert Behavior During Therapy: WFL for tasks assessed/performed Overall Cognitive Status: Within Functional Limits for tasks assessed    Balance  Balance Overall balance assessment: No apparent balance deficits (not formally assessed)  End of Session PT - End of Session Equipment Utilized During Treatment: Left knee immobilizer Activity Tolerance: Patient tolerated treatment well Patient left: in bed;with call bell/phone within reach;with family/visitor present Nurse Communication: Mobility status CPM Left Knee CPM Left Knee: On Left Knee Flexion (Degrees): 60 Left Knee Extension (Degrees): 0   GP     Donna Daniels, Donna Daniels 07/26/2013, 2:49 PM  Donna Daniels, PTA [phone] 07/26/2013

## 2013-07-26 NOTE — Evaluation (Signed)
Occupational Therapy Evaluation Patient Details Name: Ignacia MarvelCarolyn P Wicke MRN: [id number] DOB: [birth year] Today's Date: 07/26/2013 Time: [id number] OT Time Calculation (min): 20 min  OT Assessment / Plan / Recommendation History of present illness Pt is a 73 y/o female admitted s/p L TKA.   Clinical Impression   Pt presents s/p L TKA impacting her ability to perform ADL tasks independently. Pt is currently at supervision level for transfers and Min guard assist for shower transfers and LB dressing. Pt plans to d/c home w/ daughter and states that having had R TKA in October 2014, she has no further acute OT needs at this time. Will sign off, pt has DME.    OT Assessment  Patient does not need any further OT services    Follow Up Recommendations  No OT follow up;Supervision - Intermittent    Barriers to Discharge      Equipment Recommendations  None recommended by OT    Recommendations for Other Services    Frequency       Precautions / Restrictions Precautions Precautions: Knee Required Braces or Orthoses: Knee Immobilizer - Left Knee Immobilizer - Left: On when out of bed or walking Restrictions Weight Bearing Restrictions: Yes LLE Weight Bearing: Weight bearing as tolerated   Pertinent Vitals/Pain Pt reports "stiffness" not pain L knee. Repositioned and resting, CPM applied 0-60* at conclusion of treatment session today. Daughter in room w/ pt.    ADL  Eating/Feeding: Simulated;Independent Where Assessed - Eating/Feeding: Chair Grooming: Performed;Wash/dry hands;Wash/dry face;Modified independent Where Assessed - Grooming: Supported standing Upper Body Bathing: Simulated;Set up Where Assessed - Upper Body Bathing: Unsupported sitting Lower Body Bathing: Simulated;Min guard Where Assessed - Lower Body Bathing: Supported sit to stand Upper Body Dressing: Modified independent;Performed Where Assessed - Upper Body Dressing: Unsupported sitting Lower Body Dressing:  Performed;Min guard Where Assessed - Lower Body Dressing: Supported sit to Pharmacist, hospitalstand Toilet Transfer: Research scientist (life sciences)erformed;Supervision/safety Toilet Transfer Method: Sit to Baristastand Toilet Transfer Equipment: Raised toilet seat with arms (or 3-in-1 over toilet) Toileting - Clothing Manipulation and Hygiene: Performed;Modified independent;Supervision/safety Where Assessed - Toileting Clothing Manipulation and Hygiene: Sit to stand from 3-in-1 or toilet Tub/Shower Transfer: Simulated;Min guard (Ambulated to rehab gym where pt stepped up and down/over small step to simulate shower transfer) Tub/Shower Transfer Method: Ambulating Tub/Shower Transfer Equipment: Walk in shower Equipment Used: Knee Immobilizer;Rolling walker Transfers/Ambulation Related to ADLs: Pt is overall supervision level for functional mobility and transfers. Pt reports that she has been going into bathroom by herself, recommend supervision and pt verbalized understanding of this. ADL Comments: Pt was educated in role of OT, she reports that she had R TKA in Oct 2015. Pt plans to d/c to daughters house. She participated in ADL retraining session for UB/LB dressing, grooming standing at sink in bathroom & toileting transfers. Pt was also agreeable to functional mobility to rehab gym where she also was educated and performed simulated shower transfer using small step. Pt /daughter do not acknowledge any further acute OT needs at this time.    OT Diagnosis:    OT Problem List:   OT Treatment Interventions:     OT Goals(Current goals can be found in the care plan section) Acute Rehab OT Goals Patient Stated Goal: Go home  Visit Information  Last OT Received On: 07/26/13 Assistance Needed: +1 History of Present Illness: Pt is a 73 y/o female admitted s/p R TKA.       Prior Functioning     Home Living Family/patient expects to be discharged to::  Private residence Living Arrangements: Children Available Help at Discharge: Family;Available  24 hours/day Type of Home: House Home Access: Stairs to enter [employer] of Steps: 2 Entrance Stairs-Rails: None Home Layout: Able to live on main level with bedroom/bathroom Home Equipment: Bedside commode;Walker - 2 wheels;Toilet riser;Shower seat Additional Comments: Pt plans to go to daughter's home upon d/c which has 2 levels, but pt plans to stay on the first floor with a half bath and will go to her home to shower in her walk in shower. Prior Function Level of Independence: Independent Comments: yardwork, walk Communication Communication: No difficulties Dominant Hand: Right        Cognition  Cognition Arousal/Alertness: Awake/alert Behavior During Therapy: WFL for tasks assessed/performed Overall Cognitive Status: Within Functional Limits for tasks assessed    Extremity/Trunk Assessment Upper Extremity Assessment Upper Extremity Assessment: Overall WFL for tasks assessed Lower Extremity Assessment Lower Extremity Assessment: Defer to PT evaluation     Mobility Bed Mobility Overal bed mobility: Modified Independent Bed Mobility: Sit to Supine Sit to supine: Modified independent (Device/Increase time) Transfers Overall transfer level: Needs assistance Equipment used: Rolling walker (2 wheeled) Transfers: Sit to/from Stand Sit to Stand: Supervision General transfer comment: cues for safe hand placement       Balance Balance Overall balance assessment: No apparent balance deficits (not formally assessed)   End of Session OT - End of Session Equipment Utilized During Treatment: Rolling walker;Left knee immobilizer Activity Tolerance: Patient tolerated treatment well Patient left: in bed;in CPM;with family/visitor present CPM Left Knee CPM Left Knee: On Left Knee Flexion (Degrees): 60 Left Knee Extension (Degrees): 0  GO     Rosemarie Galvis Beth Dixon 07/26/2013, 12:45 PM

## 2013-07-26 NOTE — Progress Notes (Signed)
Orthopedic Tech Progress Note Patient Details:  Gilman SchmidtCarolyn P Belloso Mar 24, 1941 [id number] On cpm at 3:20 pm LLE 0-60 Patient ID: Ignacia MarvelCarolyn P Skow, female   DOB: Mar 24, 1941, 73 y.o.   MRN: [id number]   Jennye MoccasinHughes, Bonita Brindisi Craig 07/26/2013, 3:17 PM

## 2013-07-27 ENCOUNTER — Encounter (HOSPITAL_COMMUNITY): Payer: Self-pay | Admitting: Orthopaedic Surgery

## 2013-07-27 LAB — CBC
HEMATOCRIT: 29.2 % — AB (ref 36.0–46.0)
Hemoglobin: 9.6 g/dL — ABNORMAL LOW (ref 12.0–15.0)
MCH: 29.5 pg (ref 26.0–34.0)
MCHC: 32.9 g/dL (ref 30.0–36.0)
MCV: 89.8 fL (ref 78.0–100.0)
PLATELETS: 193 10*3/uL (ref 150–400)
RBC: 3.25 MIL/uL — ABNORMAL LOW (ref 3.87–5.11)
RDW: 13.6 % (ref 11.5–15.5)
WBC: 10.8 10*3/uL — AB (ref 4.0–10.5)

## 2013-07-27 MED ORDER — HYDROCODONE-ACETAMINOPHEN 5-325 MG PO TABS: 1.0000 | ORAL_TABLET | Freq: Four times a day (QID) | ORAL | Status: DC | PRN

## 2013-07-27 MED ORDER — METHOCARBAMOL 500 MG PO TABS: 500.0000 mg | ORAL_TABLET | Freq: Four times a day (QID) | ORAL | Status: DC | PRN

## 2013-07-27 MED ORDER — ASPIRIN 325 MG PO TBEC: 325.0000 mg | DELAYED_RELEASE_TABLET | Freq: Two times a day (BID) | ORAL | Status: DC

## 2013-07-27 NOTE — Progress Notes (Signed)
Physical Therapy Treatment Patient Details Name: Ignacia MarvelCarolyn P Grable MRN: [id number] DOB: [birth year] Today's Date: 07/27/2013 Time: [id number] PT Time Calculation (min): 14 min  PT Assessment / Plan / Recommendation  History of Present Illness Pt is a 73 y/o female admitted s/p R TKA.   PT Comments   Pt cont's to move well.    Follow Up Recommendations  Home health PT     Does the patient have the potential to tolerate intense rehabilitation     Barriers to Discharge        Equipment Recommendations  None recommended by PT    Recommendations for Other Services OT consult  Frequency 7X/week   Progress towards PT Goals Progress towards PT goals: Progressing toward goals  Plan Current plan remains appropriate    Precautions / Restrictions Precautions Precautions: Knee Required Braces or Orthoses: Knee Immobilizer - Left Knee Immobilizer - Left: On when out of bed or walking Restrictions LLE Weight Bearing: Weight bearing as tolerated       Mobility  Bed Mobility Overal bed mobility: Modified Independent Sit to supine: Modified independent (Device/Increase time) Transfers Overall transfer level: Modified independent Equipment used: Rolling walker (2 wheeled) Transfers: Sit to/from Stand Ambulation/Gait Ambulation/Gait assistance: Supervision Ambulation Distance (Feet): 180 Feet Assistive device: Rolling walker (2 wheeled) Gait Pattern/deviations: Step-through pattern;Decreased stride length;Decreased step length - right General Gait Details: Ambulated without KI; no knee buckling noted.   Stairs: Yes Stairs assistance: Supervision Stair Management: Two rails;Step to pattern;Forwards Number of Stairs: 3 General stair comments: Pt able to demonstrate safe technique.      Exercises Total Joint Exercises Ankle Circles/Pumps: AROM;Both;15 reps Quad Sets: AROM;Both;15 reps Straight Leg Raises: AAROM;Strengthening;Left;15 reps Knee Flexion: AAROM;Left;5 reps (self AAROM  sitting EOB)     PT Goals (current goals can now be found in the care plan section) Acute Rehab PT Goals Patient Stated Goal: Go home PT Goal Formulation: With patient Time For Goal Achievement: 08/01/13 Potential to Achieve Goals: Good  Visit Information  Last PT Received On: 07/27/13 Assistance Needed: +1 History of Present Illness: Pt is a 73 y/o female admitted s/p R TKA.    Subjective Data  Patient Stated Goal: Go home   Cognition  Cognition Arousal/Alertness: Awake/alert Behavior During Therapy: WFL for tasks assessed/performed Overall Cognitive Status: Within Functional Limits for tasks assessed    Balance     End of Session PT - End of Session Activity Tolerance: Patient tolerated treatment well Patient left: with call bell/phone within reach;in bed Nurse Communication: Mobility status   GP     Lara MulchCooper, Pearlie Nies Lynn 07/27/2013, 9:58 AM  Verdell FaceKelly Stefanny Pieri, PTA [phone] 07/27/2013

## 2013-07-27 NOTE — Discharge Summary (Signed)
Patient ID: Donna MarvelCarolyn P Vanriper MRN: [id number] DOB/AGE: [birth year] 73 y.o.  Admit date: 07/25/2013 Discharge date: 07/27/2013  Admission Diagnoses:  Active Problems:   S/P total knee replacement   Discharge Diagnoses:  Same  Past Medical History  Diagnosis Date  . High cholesterol     takes SImvastin  daily  . Thyroid disorder   . PONV (postoperative nausea and vomiting)   . Arthritis   . History of bronchitis     last time about 7668yrs ago  . Joint pain   . Joint swelling   . Sciatica   . Bruises easily   . Hypothyroidism     takes Synthroid daily  . Cataracts, bilateral     Surgeries: Procedure(s): TOTAL KNEE ARTHROPLASTY on 07/25/2013   Consultants:    Discharged Condition: Improved  Hospital Course: Donna Daniels is an 73 y.o. female who was admitted 07/25/2013 for operative treatment of<principal problem not specified>. Patient has severe unremitting pain that affects sleep, daily activities, and work/hobbies. After pre-op clearance the patient was taken to the operating room on 07/25/2013 and underwent  Procedure(s): TOTAL KNEE ARTHROPLASTY.    Patient was given perioperative antibiotics: Anti-infectives   Start     Dose/Rate Route Frequency Ordered Stop   07/25/13 1330  ceFAZolin (ANCEF) IVPB 1 g/50 mL premix     1 g 100 mL/hr over 30 Minutes Intravenous Every 6 hours 07/25/13 1040 07/25/13 2148   07/25/13 0600  ceFAZolin (ANCEF) IVPB 2 g/50 mL premix     2 g 100 mL/hr over 30 Minutes Intravenous On call to O.R. 07/24/13 1412 07/25/13 [id number]       Patient was given sequential compression devices, early ambulation, and chemoprophylaxis to prevent DVT.  Patient benefited maximally from hospital stay and there were no complications.    Recent vital signs: Patient Vitals for the past 24 hrs:  BP Temp Temp src Pulse Resp SpO2  07/27/13 0604 105/53 mmHg 97.9 F (36.6 C) Oral 94 18 98 %  07/27/13 0400 - - - - 18 -  07/27/13 0000 - - - - 18 -  07/26/13 2139  118/64 mmHg 98.6 F (37 C) Oral 100 19 98 %  07/26/13 2000 - - - - 20 98 %     Recent laboratory studies:  Recent Labs  07/26/13 0700 07/27/13 0421  WBC 15.2* 10.8*  HGB 10.9* 9.6*  HCT 32.1* 29.2*  PLT 211 193  NA 143  --   K 4.3  --   CL 105  --   CO2 26  --   BUN 14  --   CREATININE 0.74  --   GLUCOSE 150*  --   CALCIUM 8.9  --      Discharge Medications:     Medication List    STOP taking these medications       meloxicam 15 MG tablet  Commonly known as:  MOBIC      TAKE these medications       alendronate 70 MG tablet  Commonly known as:  FOSAMAX  Take 70 mg by mouth every 7 (seven) days. Take with a full glass of water on an empty stomach. Patient takes on Saturdays     aspirin 325 MG EC tablet  Take 1 tablet (325 mg total) by mouth 2 (two) times daily after a meal.     calcium-vitamin D 250-125 MG-UNIT per tablet  Commonly known as:  OSCAL  Take 2 tablets by mouth daily.  cholecalciferol 1000 UNITS tablet  Commonly known as:  VITAMIN D  Take 4,000 Units by mouth daily.     HYDROcodone-acetaminophen 5-325 MG per tablet  Commonly known as:  NORCO/VICODIN  Take 1-2 tablets by mouth every 6 (six) hours as needed for moderate pain.     levothyroxine 100 MCG tablet  Commonly known as:  SYNTHROID, LEVOTHROID  Take 100 mcg by mouth daily.     methocarbamol 500 MG tablet  Commonly known as:  ROBAXIN  Take 1 tablet (500 mg total) by mouth every 6 (six) hours as needed for muscle spasms.     multivitamin with minerals Tabs tablet  Take 1 tablet by mouth daily.     omega-3 acid ethyl esters 1 G capsule  Commonly known as:  LOVAZA  Take 1 g by mouth 3 (three) times daily.     simvastatin 40 MG tablet  Commonly known as:  ZOCOR  Take 40 mg by mouth every evening.        Diagnostic Studies: No results found.  Disposition: 06-Home-Health Care Svc      Discharge Orders   Future Orders Complete By Expires   Call MD / Call 911  As directed     Comments:     If you experience chest pain or shortness of breath, CALL 911 and be transported to the hospital emergency room.  If you develope a fever above 101 F, pus (white drainage) or increased drainage or redness at the wound, or calf pain, call your surgeon's office.   Constipation Prevention  As directed    Comments:     Drink plenty of fluids.  Prune juice may be helpful.  You may use a stool softener, such as Colace (over the counter) 100 mg twice a day.  Use MiraLax (over the counter) for constipation as needed.   Diet - low sodium heart healthy  As directed    Increase activity slowly as tolerated  As directed       Follow-up Information   Follow up with DALLDORF,PETER G, MD. Call in 2 weeks.   Specialty:  Orthopedic Surgery   Contact information:   [address] [place] [address] [phone]        Signed: Prince Rome 07/27/2013, 1:25 PM

## 2013-07-27 NOTE — Discharge Instructions (Signed)
Continue ice elevation. May change dressing as needed. Continue ASA 325 one twice a day x2 weeks. May weight-bear as tolerated. Keep dry until office visit in 2 weeks.

## 2013-07-27 NOTE — Progress Notes (Signed)
Subjective: 2 Days Post-Op Procedure(s) (LRB): TOTAL KNEE ARTHROPLASTY (Left)  Activity level:  Weightbearing as tolerated Diet tolerance:  ok Voiding:  ok Patient reports pain as 2 on 0-10 scale.    Objective: Vital signs in last 24 hours: Temp:  [97.9 F (36.6 C)-98.6 F (37 C)] 97.9 F (36.6 C) (01/29 0604) Pulse Rate:  [94-100] 94 (01/29 0604) Resp:  [18-20] 18 (01/29 0604) BP: (105-118)/(53-64) 105/53 mmHg (01/29 0604) SpO2:  [98 %] 98 % (01/29 0604)  Labs:  Recent Labs  07/26/13 0700 07/27/13 0421  HGB 10.9* 9.6*    Recent Labs  07/26/13 0700 07/27/13 0421  WBC 15.2* 10.8*  RBC 3.60* 3.25*  HCT 32.1* 29.2*  PLT 211 193    Recent Labs  07/26/13 0700  NA 143  K 4.3  CL 105  CO2 26  BUN 14  CREATININE 0.74  GLUCOSE 150*  CALCIUM 8.9   No results found for this basename: LABPT, INR,  in the last 72 hours  Physical Exam:  Neurologically intact ABD soft Neurovascular intact Sensation intact distally Intact pulses distally Dorsiflexion/Plantar flexion intact No cellulitis present Compartment soft  Assessment/Plan:  2 Days Post-Op Procedure(s) (LRB): TOTAL KNEE ARTHROPLASTY (Left) Advance diet Up with therapy Discharge home with home health Continue ASA 3251 pill twice a day x2 weeks. Return to office in 2 weeks. Home physical therapy arranged through [facility]    Lanora Reveron R 07/27/2013, 1:19 PM

## 2013-07-27 NOTE — Plan of Care (Signed)
Problem: Consults Goal: Diagnosis- Total Joint Replacement Outcome: Completed/Met Date Met:  07/27/13 Primary Total Knee

## 2013-07-28 DIAGNOSIS — Z96659 Presence of unspecified artificial knee joint: Secondary | ICD-10-CM | POA: Diagnosis not present

## 2013-07-28 DIAGNOSIS — IMO0001 Reserved for inherently not codable concepts without codable children: Secondary | ICD-10-CM | POA: Diagnosis not present

## 2013-07-28 DIAGNOSIS — M159 Polyosteoarthritis, unspecified: Secondary | ICD-10-CM | POA: Diagnosis not present

## 2013-07-28 DIAGNOSIS — Z471 Aftercare following joint replacement surgery: Secondary | ICD-10-CM | POA: Diagnosis not present

## 2013-07-31 DIAGNOSIS — IMO0001 Reserved for inherently not codable concepts without codable children: Secondary | ICD-10-CM | POA: Diagnosis not present

## 2013-07-31 DIAGNOSIS — M159 Polyosteoarthritis, unspecified: Secondary | ICD-10-CM | POA: Diagnosis not present

## 2013-07-31 DIAGNOSIS — Z471 Aftercare following joint replacement surgery: Secondary | ICD-10-CM | POA: Diagnosis not present

## 2013-07-31 DIAGNOSIS — Z96659 Presence of unspecified artificial knee joint: Secondary | ICD-10-CM | POA: Diagnosis not present

## 2013-08-02 DIAGNOSIS — Z96659 Presence of unspecified artificial knee joint: Secondary | ICD-10-CM | POA: Diagnosis not present

## 2013-08-02 DIAGNOSIS — IMO0001 Reserved for inherently not codable concepts without codable children: Secondary | ICD-10-CM | POA: Diagnosis not present

## 2013-08-02 DIAGNOSIS — Z471 Aftercare following joint replacement surgery: Secondary | ICD-10-CM | POA: Diagnosis not present

## 2013-08-02 DIAGNOSIS — M159 Polyosteoarthritis, unspecified: Secondary | ICD-10-CM | POA: Diagnosis not present

## 2013-08-03 DIAGNOSIS — M159 Polyosteoarthritis, unspecified: Secondary | ICD-10-CM | POA: Diagnosis not present

## 2013-08-03 DIAGNOSIS — Z96659 Presence of unspecified artificial knee joint: Secondary | ICD-10-CM | POA: Diagnosis not present

## 2013-08-03 DIAGNOSIS — Z471 Aftercare following joint replacement surgery: Secondary | ICD-10-CM | POA: Diagnosis not present

## 2013-08-03 DIAGNOSIS — IMO0001 Reserved for inherently not codable concepts without codable children: Secondary | ICD-10-CM | POA: Diagnosis not present

## 2013-08-07 DIAGNOSIS — Z471 Aftercare following joint replacement surgery: Secondary | ICD-10-CM | POA: Diagnosis not present

## 2013-08-07 DIAGNOSIS — IMO0001 Reserved for inherently not codable concepts without codable children: Secondary | ICD-10-CM | POA: Diagnosis not present

## 2013-08-07 DIAGNOSIS — M159 Polyosteoarthritis, unspecified: Secondary | ICD-10-CM | POA: Diagnosis not present

## 2013-08-07 DIAGNOSIS — M1710 Unilateral primary osteoarthritis, unspecified knee: Secondary | ICD-10-CM | POA: Diagnosis not present

## 2013-08-07 DIAGNOSIS — Z96659 Presence of unspecified artificial knee joint: Secondary | ICD-10-CM | POA: Diagnosis not present

## 2013-08-07 DIAGNOSIS — M25569 Pain in unspecified knee: Secondary | ICD-10-CM | POA: Diagnosis not present

## 2013-08-09 DIAGNOSIS — IMO0001 Reserved for inherently not codable concepts without codable children: Secondary | ICD-10-CM | POA: Diagnosis not present

## 2013-08-09 DIAGNOSIS — Z471 Aftercare following joint replacement surgery: Secondary | ICD-10-CM | POA: Diagnosis not present

## 2013-08-09 DIAGNOSIS — M159 Polyosteoarthritis, unspecified: Secondary | ICD-10-CM | POA: Diagnosis not present

## 2013-08-09 DIAGNOSIS — Z96659 Presence of unspecified artificial knee joint: Secondary | ICD-10-CM | POA: Diagnosis not present

## 2013-08-11 DIAGNOSIS — M159 Polyosteoarthritis, unspecified: Secondary | ICD-10-CM | POA: Diagnosis not present

## 2013-08-11 DIAGNOSIS — Z96659 Presence of unspecified artificial knee joint: Secondary | ICD-10-CM | POA: Diagnosis not present

## 2013-08-11 DIAGNOSIS — Z471 Aftercare following joint replacement surgery: Secondary | ICD-10-CM | POA: Diagnosis not present

## 2013-08-11 DIAGNOSIS — IMO0001 Reserved for inherently not codable concepts without codable children: Secondary | ICD-10-CM | POA: Diagnosis not present

## 2013-08-15 ENCOUNTER — Ambulatory Visit (HOSPITAL_COMMUNITY): Payer: Medicare Other | Admitting: Physical Therapy

## 2013-08-17 ENCOUNTER — Ambulatory Visit (HOSPITAL_COMMUNITY)
Admission: RE | Admit: 2013-08-17 | Discharge: 2013-08-17 | Disposition: A | Discharge status: Home / Self Care | Payer: Medicare Other | Source: Ambulatory Visit | Attending: Orthopaedic Surgery | Admitting: Orthopaedic Surgery

## 2013-08-17 DIAGNOSIS — M25569 Pain in unspecified knee: Secondary | ICD-10-CM | POA: Insufficient documentation

## 2013-08-17 DIAGNOSIS — R269 Unspecified abnormalities of gait and mobility: Secondary | ICD-10-CM | POA: Insufficient documentation

## 2013-08-17 DIAGNOSIS — M25661 Stiffness of right knee, not elsewhere classified: Secondary | ICD-10-CM | POA: Insufficient documentation

## 2013-08-17 DIAGNOSIS — IMO0001 Reserved for inherently not codable concepts without codable children: Secondary | ICD-10-CM | POA: Diagnosis not present

## 2013-08-17 DIAGNOSIS — M25669 Stiffness of unspecified knee, not elsewhere classified: Secondary | ICD-10-CM | POA: Insufficient documentation

## 2013-08-17 NOTE — Evaluation (Signed)
Physical Therapy Evaluation  Patient Details  Name: Donna Daniels MRN: [id number] Date of Birth: February 18, 1941  Today's Date: 08/17/2013 Time: 1015-1100 PT Time Calculation (min): 45 min   Charges  1 eval 1015 - 0258  NI 7782- 1100            Visit#: 1 of 5  Re-eval: 09/16/13 Assessment Diagnosis: L TKR 07/25/2013  Next MD Visit: Dr. Rhona Daniels  08/23/13  Prior Therapy: HH   Authorization: [employer]     Authorization Time Period:    Authorization Visit#: 1 of 10   Past Medical History:  Past Medical History  Diagnosis Date  . High cholesterol     takes SImvastin  daily  . Thyroid disorder   . PONV (postoperative nausea and vomiting)   . Arthritis   . History of bronchitis     last time about 16yrs ago  . Joint pain   . Joint swelling   . Sciatica   . Bruises easily   . Hypothyroidism     takes Synthroid daily  . Cataracts, bilateral    Past Surgical History:  Past Surgical History  Procedure Laterality Date  . Cesarean section      x 1   . Vesicovaginal fistula closure w/ tah    . Thyroidectomy    . Hand surgery Left   . Abdominal hysterectomy    . Anterior cervical decomp/discectomy fusion  12/18/2011    Procedure: ANTERIOR CERVICAL DECOMPRESSION/DISCECTOMY FUSION 2 LEVELS;  Surgeon: Donna Ghee, MD;  Location: [facility];  Service: Neurosurgery;  Laterality: N/A;  Anterior cervical decompression fusion four-six  . Total knee arthroplasty Right 04/18/2013    Procedure: TOTAL KNEE ARTHROPLASTY;  Surgeon: Donna Dibble, MD;  Location: [facility];  Service: Orthopedics;  Laterality: Right;  . Colonoscopy    . Total knee arthroplasty Left 07/24/2013    DR Donna Daniels  . Total knee arthroplasty Left 07/25/2013    Procedure: TOTAL KNEE ARTHROPLASTY;  Surgeon: Donna Dibble, MD;  Location: [facility];  Service: Orthopedics;  Laterality: Left;    Subjective Symptoms/Limitations Symptoms: L TKR jan 27,2015, c/o  left knee stiffness and mild limp, completed home care , very  pleased with her progress  Pertinent History: 73 yr old, right TKR Oct 2014,  Pain Assessment Pain Score: 4 /10  Pain Location: Knee Pain Orientation: Left Pain Type: Surgical pain , 3 weeks post op      Prior Functioning : hx of left knee pain secondary to DJD  Prior Function Level of Independence: Independent with basic ADLs  Able to Take Stairs?: Yes Driving: Yes Vocation: Retired  Cognition/Observation Observation/Other Assessments Observations: mild scar adherence, incision site healing well , presents without walking device   Sensation/Coordination/Flexibility/Functional Tests Functional Tests Functional Tests: FOTO 83    Assessment LLE AROM (degrees) Left Knee Extension: 0 Left Knee Flexion: 115 LLE PROM (degrees) Left Knee Extension: 0  Left Knee Flexion: 118  LLE Strength Left Hip ABduction: 5/5 Left Knee Flexion: 4/5 Left Knee Extension: 4/5  Exercise/Treatments Mobility/Balance  Ambulation/Gait Ambulation/Gait: Yes Gait Pattern: Left flexed knee in stance, lacks terminal knee extension at heel strike  Gait velocity: WNL  Stairs: Yes, reciprocal with handrail  Stairs Assistance: : Independent Number of Stairs: 10   Stretches  runner calf stretch using wall  30 sec x 2 L  Aerobic Stationary Bike: 8 min ROM recumbant  Standing Terminal Knee Extension: Strengthening;Left;2 sets;10 reps   Manual Therapy:  Other Manual Therapy: 5  min scar massage and PROM flexion, education on scar massage    Physical Therapy Assessment and Plan PT Assessment and Plan Clinical Impression Statement: 73 year old female post left TKR 07/25/13, progressing very well post op. Main concerns  are lack of full left  knee extension at heel strike and mild joint stiffness and decreaed AROM. Patient very motivated with HEP.   Pt will benefit from skilled therapeutic intervention in order to improve on the following deficits: Abnormal gait;Decreased range of motion;Increased  fascial restricitons Rehab Potential: Good PT Frequency: Min 2X/week PT Duration:  (3 weeks ) PT Plan: gait training, therapeutic activities, therapeutic exercise, manual  techniques, icing, neuromuscular  Re education, education  continue PT 1-4 more visits and discharge with goals met. patient states dr may release her next week. , next visit scar massage,gait education, HEP progression, biker or Nu step     Goals PT Short Term Goals Time to Complete Short Term Goals: 3 weeks PT Short Term Goal 1: ambulate in clinic without gait deviations to maintain good quality of knee and hip joint  PT Short Term Goal 1 - Progress: Progressing toward goal PT Short Term Goal 2: left knee AROM 0- 120 supine for flexibility during all ADLs   Problem List Patient Active Problem List   Diagnosis Date Noted  . Abnormality of gait 08/17/2013  . Stiffness of left  knee 08/17/2013  . S/P total knee replacement 07/25/2013  . Stiffness of joint, not elsewhere classified, lower leg 05/03/2013  . Difficulty in walking(719.7) 05/03/2013  . 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 CLOSED FRACTURE OF PATELLA 03/06/2010    PT - End of Session Activity Tolerance: Patient tolerated treatment well PT Plan of Care Consulted and Agree with Plan of Care: Patient  GP Functional Assessment Tool Used: FOTO  Functional Limitation: Mobility: Walking and moving around Mobility: Walking and Moving Around Current Status (M0768): At least 20 percent but less than 40 percent impaired, limited or restricted Mobility: Walking and Moving Around Goal Status [phone]): At least 1 percent but less than 20 percent impaired, limited or restricted  Donna Daniels 08/17/2013, 12:06 PM  Physician Documentation Your signature is required to indicate approval of the treatment plan as stated above.  Please sign and either send electronically or make a copy of this report for your files and return this physician signed original.    Please mark one 1.__approve of plan  2. ___approve of plan with the following conditions.   ______________________________                                                          _____________________ Physician Signature  Date  

## 2013-08-18 NOTE — Addendum Note (Signed)
Encounter addended by: Bella Kennedyynthia J Russell, PT on: 08/18/2013  4:28 PM<BR>     Documentation filed: Clinical Notes

## 2013-08-22 ENCOUNTER — Ambulatory Visit (HOSPITAL_COMMUNITY): Payer: Medicare Other

## 2013-08-24 ENCOUNTER — Ambulatory Visit (HOSPITAL_COMMUNITY): Payer: Medicare Other

## 2013-08-28 ENCOUNTER — Ambulatory Visit (HOSPITAL_COMMUNITY)
Admission: RE | Admit: 2013-08-28 | Discharge: 2013-08-28 | Disposition: A | Discharge status: Home / Self Care | Payer: Medicare Other | Source: Ambulatory Visit | Attending: Orthopaedic Surgery | Admitting: Orthopaedic Surgery

## 2013-08-28 DIAGNOSIS — M25669 Stiffness of unspecified knee, not elsewhere classified: Secondary | ICD-10-CM | POA: Diagnosis not present

## 2013-08-28 DIAGNOSIS — R269 Unspecified abnormalities of gait and mobility: Secondary | ICD-10-CM

## 2013-08-28 DIAGNOSIS — M25569 Pain in unspecified knee: Secondary | ICD-10-CM | POA: Diagnosis not present

## 2013-08-28 DIAGNOSIS — R262 Difficulty in walking, not elsewhere classified: Secondary | ICD-10-CM | POA: Diagnosis not present

## 2013-08-28 DIAGNOSIS — M25661 Stiffness of right knee, not elsewhere classified: Secondary | ICD-10-CM

## 2013-08-28 DIAGNOSIS — IMO0001 Reserved for inherently not codable concepts without codable children: Secondary | ICD-10-CM | POA: Diagnosis not present

## 2013-08-28 NOTE — Progress Notes (Signed)
Physical Therapy Treatment Patient Details  Name: Donna Daniels MRN: [id number] Date of Birth: 1940-12-16  Today's Date: 08/28/2013 Time: 1110-1206 PT Time Calculation (min): 56 min Charge: TE 1110-1155, Ice [id number]  Visit#: 2 of 5  Re-eval: 09/16/13 Assessment Diagnosis: L TKR 07/25/2013  Surgical Date: 04/18/13 Next MD Visit: Dr. Jerl Santosalldorf  09/20/13  Prior Therapy: HH   Authorization: [employer]   Authorization Time Period:    Authorization Visit#: 2 of 10   Subjective: Symptoms/Limitations Symptoms: Pt stated pain free today, compliance with HEP without question.  Went to MD last Wednesday, very pleased with progress.  MD wishes for OPPT for 1 week. Pain Assessment Currently in Pain?: No/denies  Precautions/Restrictions  Precautions Precautions: Fall;Knee  Exercise/Treatments Aerobic Stationary Bike: 8 min ROM recumbant  Standing Heel Raises: 20 reps;Limitations Heel Raises Limitations: toe raises  Knee Flexion: Left;15 reps Forward Lunges: Both;15 reps;Limitations Forward Lunges Limitations: on floor and 6in step Side Lunges: Both;15 reps;Limitations Side Lunges Limitations: therapist facilitation for form Terminal Knee Extension: Strengthening;Left;15 reps;Theraband Theraband Level (Terminal Knee Extension): Level 4 (Blue) Lateral Step Up: 15 reps;Hand Hold: 2;Step Height: 4" Functional Squat: 15 reps Stairs: 2RT reciprocal pattern 1 HR Gait Training: Gait training with cueing for heel to toe pattern, posture and equal stride length for normalized gait mechanics   Modalities Modalities: Cryotherapy Manual Therapy Manual Therapy: Other (comment) Other Manual Therapy: 5 min scar massage and PROM flexion Cryotherapy Number Minutes Cryotherapy: 10 Minutes Cryotherapy Location: Knee Type of Cryotherapy: Ice pack  Physical Therapy Assessment and Plan PT Assessment and Plan Clinical Impression Statement: Began POC focusing on functional strengthening  exercises to improve gait mechanics and improve AROM.  Patient displays ability to ambulate without loss of balance. Patient is unable to perform single leg balance activities without occasional UE support due to weakness  resulting in loss of balance.  Patient is able to ambulate up stairs with reciprocal  gait pattern  but requires increased time and cuing to  do so.  Patient displays preference to ambulate down stairs wirth step to pattern but is able to descend steps with reciprocal gait when cued,, but requires increased handrail assist.  Pt will benefit from skilled therapeutic intervention in order to improve on the following deficits: Abnormal gait;Decreased range of motion;Increased fascial restricitons Rehab Potential: Good PT Frequency: Min 2X/week PT Treatment/Interventions: Gait training;Stair training;Therapeutic activities;Therapeutic exercise;Manual techniques;Modalities;Neuromuscular re-education;Patient/family education PT Plan: cont. current plan of care for this week then DC to HEP.     Goals PT Short Term Goals Time to Complete Short Term Goals: 3 weeks PT Short Term Goal 1: ambulate in clinic without gait deviations to maintain good quality of knee and hip joint  PT Short Term Goal 1 - Progress: Progressing toward goal PT Short Term Goal 2: left knee AROM 0- 120 supine for flexibility during all ADLs  PT Short Term Goal 2 - Progress: Progressing toward goal  Problem List Patient Active Problem List   Diagnosis Date Noted  . Abnormality of gait 08/17/2013  . Stiffness of left  knee 08/17/2013  . S/P total knee replacement 07/25/2013  . Stiffness of joint, not elsewhere classified, lower leg 05/03/2013  . Difficulty in walking(719.7) 05/03/2013  . 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 CLOSED FRACTURE OF PATELLA 03/06/2010    PT - End of Session Activity Tolerance: Patient tolerated treatment well General Behavior During Therapy: [facility]FL for tasks  assessed/performed  GP    Juel BurrowCockerham, Rethel Sebek Jo 08/28/2013, 12:14  PM

## 2013-08-30 ENCOUNTER — Ambulatory Visit (HOSPITAL_COMMUNITY)
Admission: RE | Admit: 2013-08-30 | Discharge: 2013-08-30 | Disposition: A | Discharge status: Home / Self Care | Payer: Medicare Other | Source: Ambulatory Visit | Attending: Family Medicine | Admitting: Family Medicine

## 2013-08-30 DIAGNOSIS — IMO0001 Reserved for inherently not codable concepts without codable children: Secondary | ICD-10-CM | POA: Diagnosis not present

## 2013-08-30 NOTE — Progress Notes (Signed)
Physical Therapy Treatment Patient Details  Name: Donna Daniels MRN: [id number] Date of Birth: May 08, 1941  Today's Date: 08/30/2013 Time: 1100-1138 PT Time Calculation (min): 38 min  Visit#: 3 of 5  Re-eval: 09/16/13 Authorization: [employer]   Authorization Visit#: 3 of 10  Charges:  therex 1100-1130 (30'), manual 1130-1138 (8')  Subjective: Symptoms/Limitations Symptoms: Pt reports no difficulties with any of her exercises or activities at home.  All functional tasks are normal.  Only complaint is stiffness and has to get up at night some due to stiffness. Pain Assessment Currently in Pain?: No/denies   Exercise/Treatments Stretches Gastroc Stretch: 3 reps;30 seconds Aerobic Stationary Bike: 8 min ROM recumbant  Standing Heel Raises: 20 reps;Limitations Heel Raises Limitations: toe raises  Forward Lunges: Both;15 reps;Limitations Side Lunges: Both;15 reps;Limitations Terminal Knee Extension: Strengthening;Left;15 reps;Theraband Theraband Level (Terminal Knee Extension): Level 4 (Blue) Lateral Step Up: 15 reps;Hand Hold: 2;Step Height: 6" Functional Squat: 15 reps   Manual Therapy Massage: scar massage to distal scar/inferior knee in supine to decrease fascial restriction  Physical Therapy Assessment and Plan PT Assessment and Plan Clinical Impression Statement: Increased lateral step up to 6" and began forward step down with 6".  Pt required tactile cues to complete these activities and lunges in correct form. ROM equal RT/LT knee.  Pt overall progressing well and is ready for discharge next visit.  Pt without questions or concerns regarding treatment and HEP.  Declined ice pack at end of session today as will do at home. Rehab Potential: Good PT Frequency: Min 2X/week PT Plan: Discharge next visit to HEP.     Problem List Patient Active Problem List   Diagnosis Date Noted  . Abnormality of gait 08/17/2013  . Stiffness of left  knee 08/17/2013  . S/P total knee  replacement 07/25/2013  . Stiffness of joint, not elsewhere classified, lower leg 05/03/2013  . Difficulty in walking(719.7) 05/03/2013  . 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 CLOSED FRACTURE OF PATELLA 03/06/2010    PT - End of Session Activity Tolerance: Patient tolerated treatment well General Behavior During Therapy: WFL for tasks assessed/performed   Lurena NidaAmy B Shante Archambeault, PTA/CLT 08/30/2013, 12:08 PM

## 2013-09-01 ENCOUNTER — Ambulatory Visit (HOSPITAL_COMMUNITY)
Admission: RE | Admit: 2013-09-01 | Discharge: 2013-09-01 | Disposition: A | Discharge status: Home / Self Care | Payer: Medicare Other | Source: Ambulatory Visit | Attending: Family Medicine | Admitting: Family Medicine

## 2013-09-01 DIAGNOSIS — M25661 Stiffness of right knee, not elsewhere classified: Secondary | ICD-10-CM

## 2013-09-01 DIAGNOSIS — R269 Unspecified abnormalities of gait and mobility: Secondary | ICD-10-CM

## 2013-09-01 DIAGNOSIS — IMO0001 Reserved for inherently not codable concepts without codable children: Secondary | ICD-10-CM | POA: Diagnosis not present

## 2013-09-01 NOTE — Progress Notes (Signed)
Physical Therapy Re-evaluation/Progress note/ Discharge summary  Patient Details  Name: Donna Daniels MRN: [id number] Date of Birth: 12-27-1940  Today's Date: 09/01/2013 Time: 1103-1203 PT Time Calculation (min): 60 min Charge: TE 1103-1140, Manual 1140-1148, ROM 1148-1153, Ice 0017-4944              Visit#: 4 of 5  Re-eval: 09/16/13 Assessment Diagnosis: L TKR 07/25/2013  Surgical Date: 04/18/13 Next MD Visit: Dr. Rhona Raider  09/20/13  Prior Therapy: HH   Authorization: [employer]     Authorization Time Period:    Authorization Visit#: 4 of 10   Subjective Symptoms/Limitations Symptoms: Pt reports she is currnelty pain free and able to complete all functional tasks without difficutly.  Able to sit, stand and walk for hours with no pain.  Only compliant is stiffness at night.   Pain Assessment Currently in Pain?: No/denies  Precautions/Restrictions  Precautions Precautions: Fall;Knee  Sensation/Coordination/Flexibility/Functional Tests Functional Tests Functional Tests: FOTO 72/38  (was 64/36)  Assessment  Lt AROM 3-122  Strength WFL for knee extension and flexion, pt able to function withoud difficulties.   Exercise/Treatments Stretches Press photographer: 3 reps;30 seconds Aerobic Stationary Bike: Nustep hill level #3, resistance level 3 spm goal >85 Standing Heel Raises: Limitations Heel Raises Limitations: heel and toe walking 2RT Forward Lunges: Both;15 reps;Limitations Side Lunges: Both;15 reps;Limitations Side Lunges Limitations: therapist facilitation for form Terminal Knee Extension: Strengthening;Left;15 reps;Theraband Theraband Level (Terminal Knee Extension): Level 4 (Blue) Lateral Step Up: 15 reps;Hand Hold: 2;Step Height: 6" Forward Step Up: 15 reps;Step Height: 6";Hand Hold: 1;Right Step Down: 15 reps;Hand Hold: 1;Step Height: 6" Functional Squat: 15 reps Stairs: 2RT reciprocal pattern 1 HR Supine Knee Extension: PROM  Modalities Modalities:  Cryotherapy Manual Therapy Manual Therapy: Myofascial release Other Manual Therapy: 8 min scar massage and PROM extension Cryotherapy Number Minutes Cryotherapy: 10 Minutes Cryotherapy Location: Knee Type of Cryotherapy: Ice pack  Physical Therapy Assessment and Plan PT Assessment and Plan Clinical Impression Statement: Re-assessment complete with the following findings: pt compliant with HEP and able to demonstrate appropraite technique with all exercises.  Pt has met 2/3 STGs and 4/4 LTGs.  Pt able to present increased strength to within [facility], AROM 3-122 for improved gait mechanics with no AD.  Pt stated she is able to sit, stand and walk for 1 hour for return to functional tasks with no difficuty.  Only complaint pt has at this time is stiffness during night.  Able to demonstrate reciprocal pattern gait mechanics with stairs safey with assistance of one handrail.  Improve perceived functional abilities with FOTO score of 72% status with 28% limitations.   PT Plan: D/C to HEP.    Goals PT Short Term Goals Time to Complete Short Term Goals: 3 weeks PT Short Term Goal 1: ambulate in clinic without gait deviations to maintain good quality of knee and hip joint  PT Short Term Goal 1 - Progress: Met PT Short Term Goal 2: left knee AROM 0- 120 supine for flexibility during all ADLs  PT Short Term Goal 2 - Progress: Partly met (AROM 3-122) PT Short Term Goal 3 - Progress: Met PT Long Term Goals PT Long Term Goal 1 - Progress: Met PT Long Term Goal 2 - Progress: Met Long Term Goal 3 Progress: Met Long Term Goal 4 Progress: Met  Problem List Patient Active Problem List   Diagnosis Date Noted  . Abnormality of gait 08/17/2013  . Stiffness of left  knee 08/17/2013  . S/P total knee replacement 07/25/2013  .  Stiffness of joint, not elsewhere classified, lower leg 05/03/2013  . Difficulty in walking(719.7) 05/03/2013  . 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 CLOSED FRACTURE OF  PATELLA 03/06/2010    PT - End of Session Activity Tolerance: Patient tolerated treatment well General Behavior During Therapy: WFL for tasks assessed/performed  GP Functional Assessment Tool Used: FOTO  Functional Limitation: Mobility: Walking and moving around Mobility: Walking and Moving Around Goal Status [phone]): At least 1 percent but less than 20 percent impaired, limited or restricted Mobility: Walking and Moving Around Discharge Status [id number]): At least 1 percent but less than 20 percent impaired, limited or restricted  Aldona Lento 09/01/2013, 1:37 PM  Physician Documentation Your signature is required to indicate approval of the treatment plan as stated above.  Please sign and either send electronically or make a copy of this report for your files and return this physician signed original.   Please mark one 1.__approve of plan  2. ___approve of plan with the following conditions.   ______________________________                                                          _____________________ Physician Signature                                                                                                             Date

## 2013-09-13 IMAGING — CR DG CERVICAL SPINE 2 OR 3 VIEWS
3 series · 3 of 3 positions shown · non-contrast
Comparison: None.

CLINICAL DATA: History of neck pain in the left side of the neck.
No known injury.

CERVICAL SPINE - 2-3 VIEW

[view not recorded (1 of 3)]
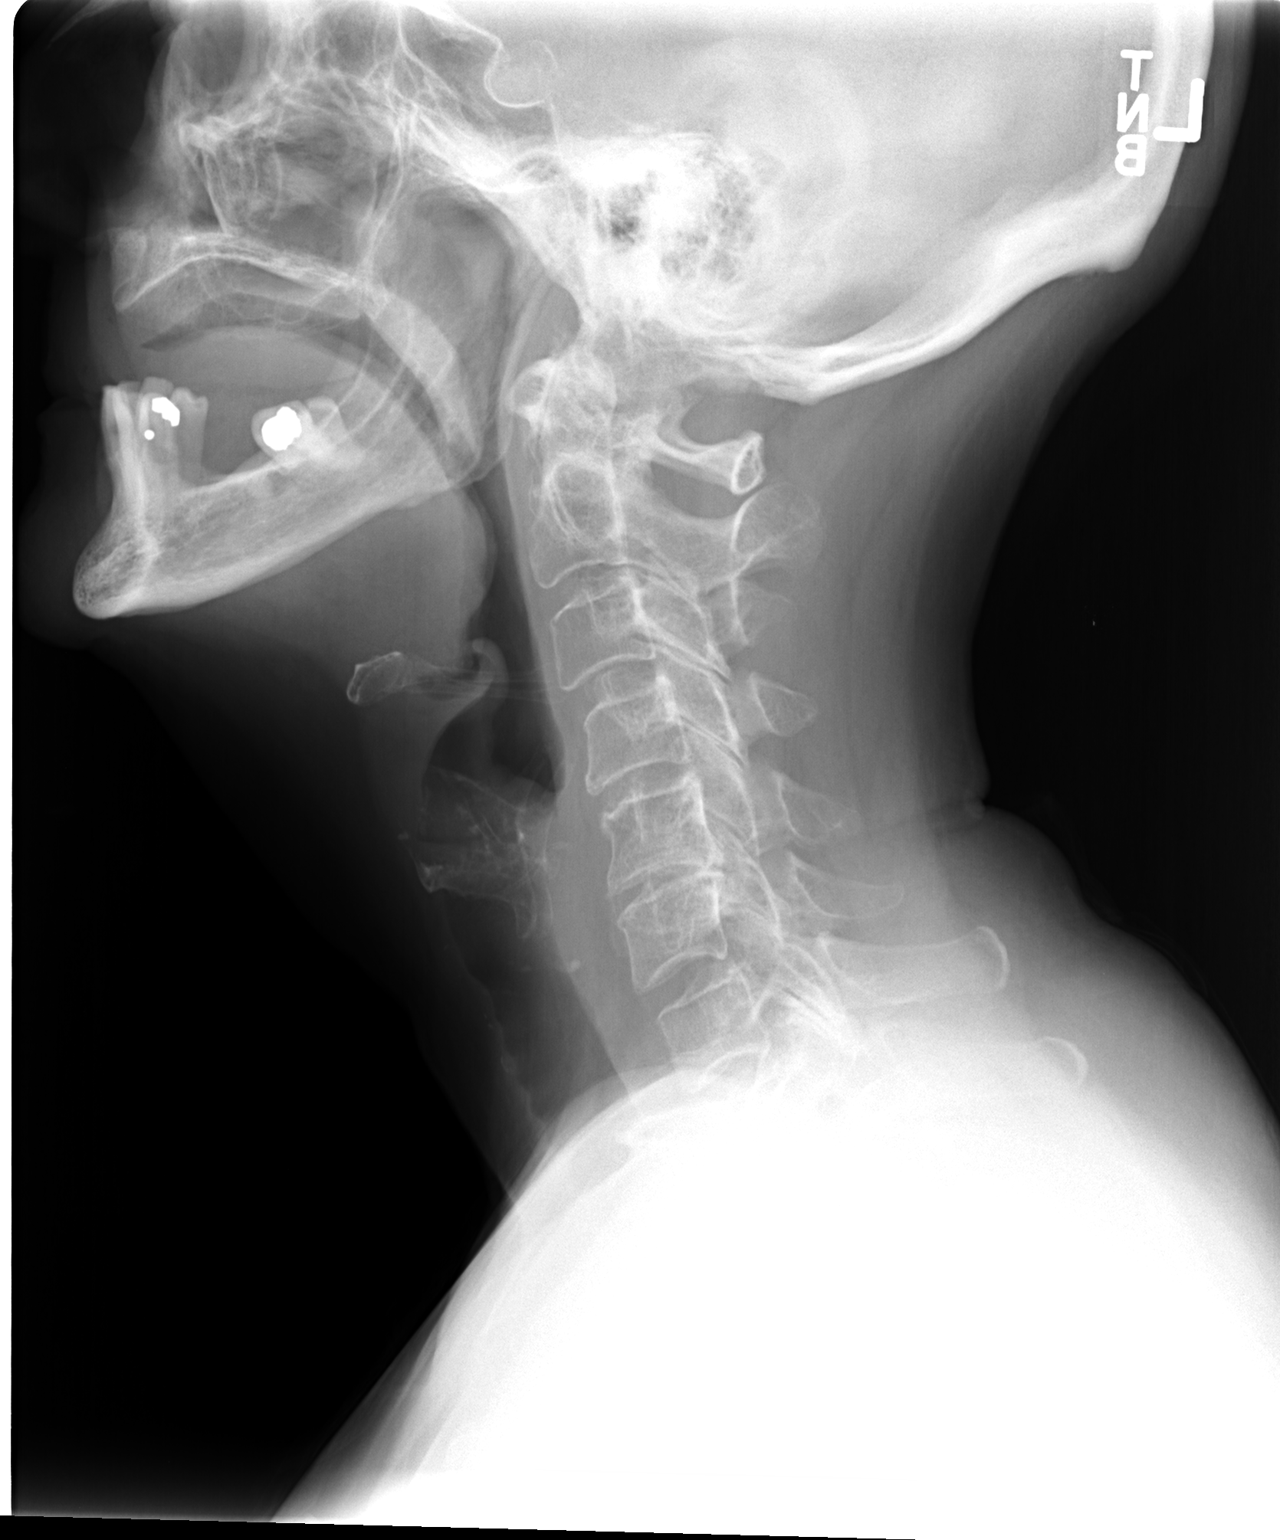

[view not recorded (2 of 3)]
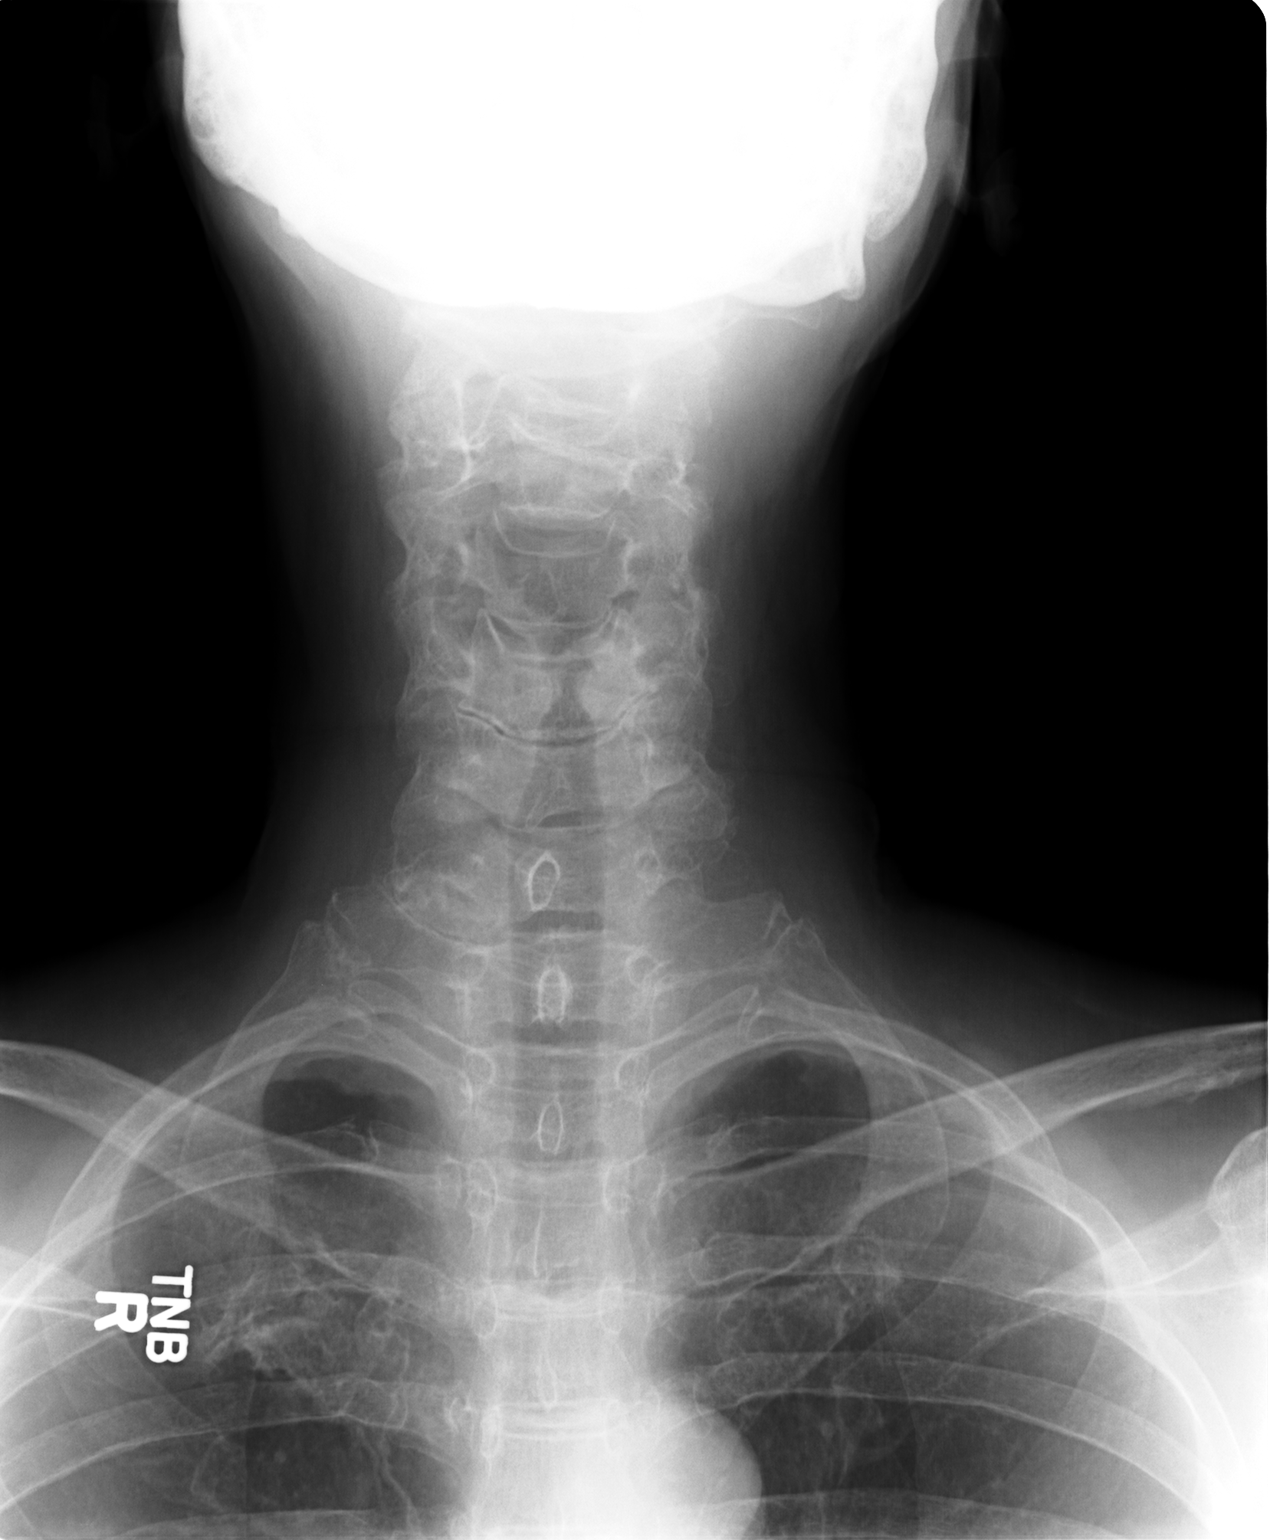

[view not recorded (3 of 3)]
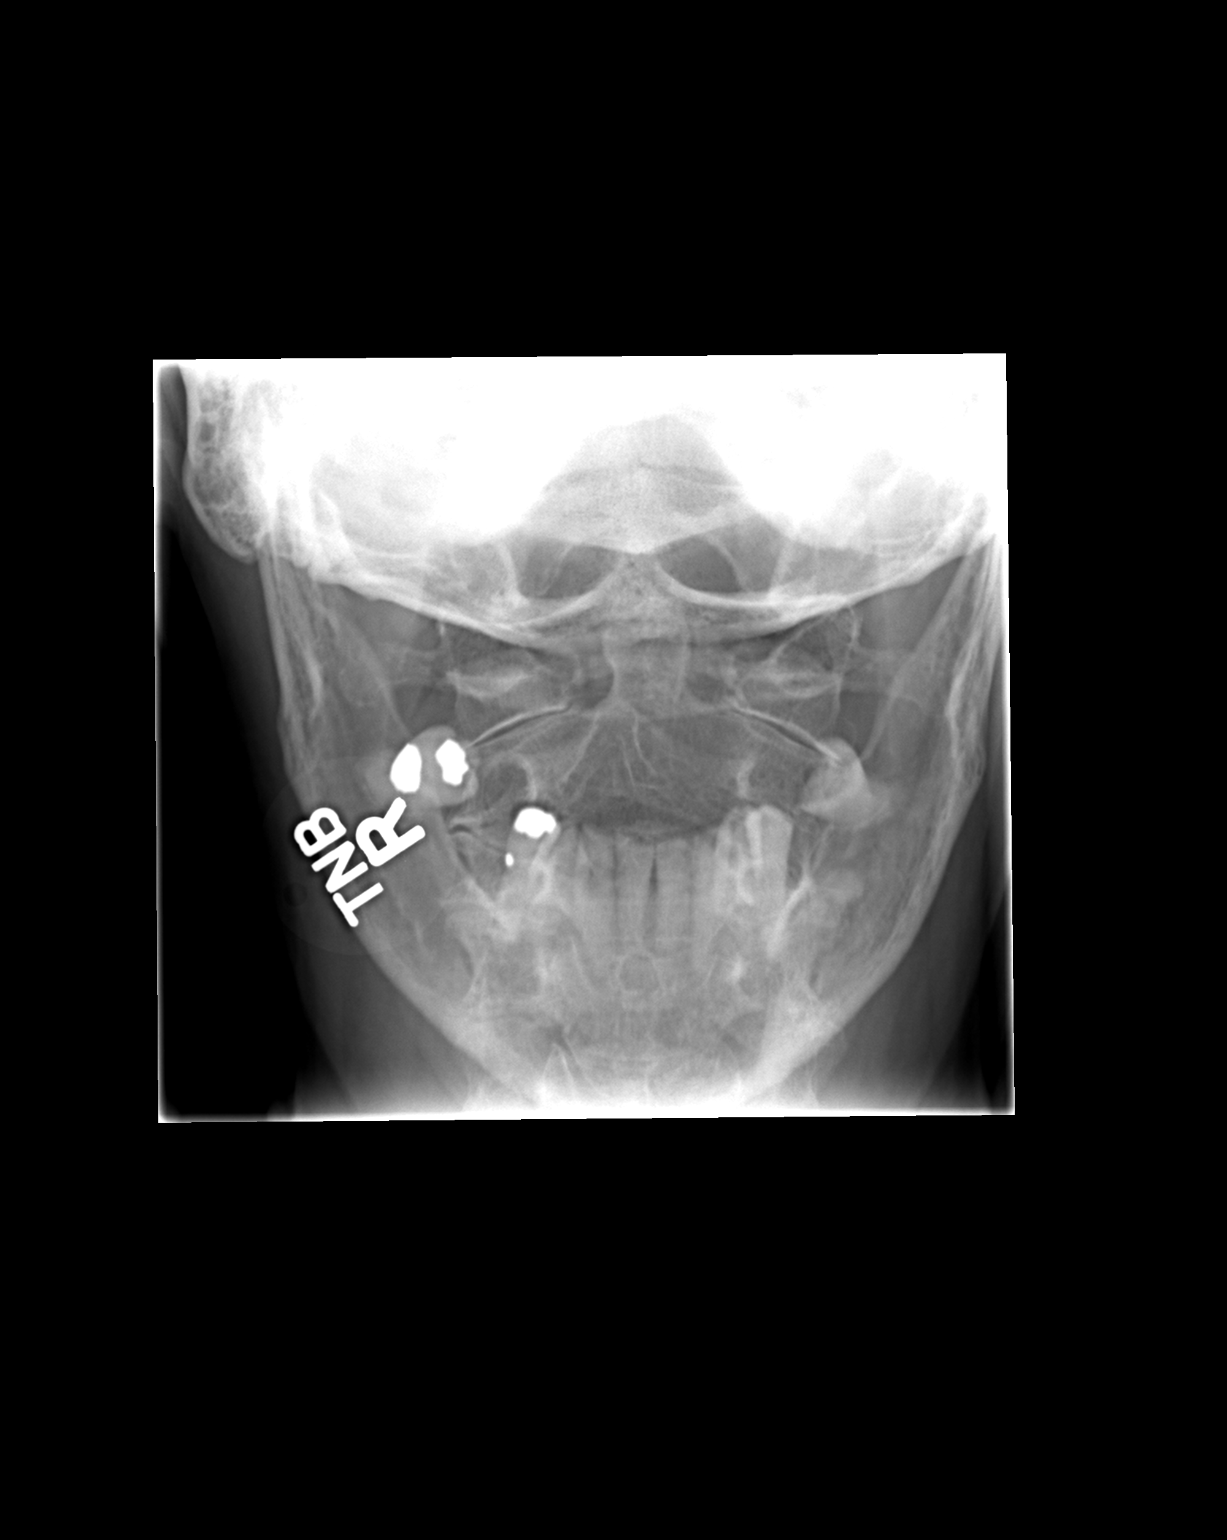

[3 of 3 positions shown; findings below may reference images not displayed]

FINDINGS: No prevertebral soft tissue swelling is evident.  There
is straightening and slight reversal of the normal cervical
lordosis.  There is narrowing of the intervertebral disc space at
the C5-C6 level.  There is marginal osteophyte formation
representing degenerative spondylosis.  No fracture or bony
destruction is seen.  There is apophyseal joint degenerative
spondylosis.  No cervical rib is evident. Osteopenic appearance of
bones.
IMPRESSION: Straightening and reversal of normal cervical lordosis is seen.
This may be associated with muscle spasm.  Osteopenic appearance of
bones.  Changes of degenerative disc disease and degenerative
spondylosis.

## 2013-12-19 ENCOUNTER — Other Ambulatory Visit: Payer: Self-pay

## 2013-12-19 DIAGNOSIS — Z1231 Encounter for screening mammogram for malignant neoplasm of breast: Secondary | ICD-10-CM

## 2014-01-01 ENCOUNTER — Ambulatory Visit
Admission: RE | Admit: 2014-01-01 | Discharge: 2014-01-01 | Disposition: A | Discharge status: Home / Self Care | Payer: Medicare Other | Source: Ambulatory Visit

## 2014-01-01 DIAGNOSIS — Z1231 Encounter for screening mammogram for malignant neoplasm of breast: Secondary | ICD-10-CM

## 2014-01-01 DIAGNOSIS — M5430 Sciatica, unspecified side: Secondary | ICD-10-CM | POA: Diagnosis not present

## 2014-01-29 DIAGNOSIS — M1710 Unilateral primary osteoarthritis, unspecified knee: Secondary | ICD-10-CM | POA: Diagnosis not present

## 2014-01-29 DIAGNOSIS — M25569 Pain in unspecified knee: Secondary | ICD-10-CM | POA: Diagnosis not present

## 2014-03-07 DIAGNOSIS — E039 Hypothyroidism, unspecified: Secondary | ICD-10-CM | POA: Diagnosis not present

## 2014-03-07 DIAGNOSIS — Z23 Encounter for immunization: Secondary | ICD-10-CM | POA: Diagnosis not present

## 2014-03-07 DIAGNOSIS — E785 Hyperlipidemia, unspecified: Secondary | ICD-10-CM | POA: Diagnosis not present

## 2014-03-07 DIAGNOSIS — Z6825 Body mass index (BMI) 25.0-25.9, adult: Secondary | ICD-10-CM | POA: Diagnosis not present

## 2014-03-16 DIAGNOSIS — Z23 Encounter for immunization: Secondary | ICD-10-CM | POA: Diagnosis not present

## 2014-07-27 DIAGNOSIS — Z96659 Presence of unspecified artificial knee joint: Secondary | ICD-10-CM | POA: Diagnosis not present

## 2014-12-24 ENCOUNTER — Other Ambulatory Visit: Payer: Self-pay

## 2015-03-06 ENCOUNTER — Other Ambulatory Visit: Payer: Self-pay

## 2015-03-06 DIAGNOSIS — Z1231 Encounter for screening mammogram for malignant neoplasm of breast: Secondary | ICD-10-CM

## 2015-03-08 ENCOUNTER — Ambulatory Visit
Admission: RE | Admit: 2015-03-08 | Discharge: 2015-03-08 | Disposition: A | Discharge status: Home / Self Care | Payer: Medicare Other | Source: Ambulatory Visit

## 2015-03-08 DIAGNOSIS — Z1231 Encounter for screening mammogram for malignant neoplasm of breast: Secondary | ICD-10-CM

## 2015-03-20 DIAGNOSIS — Z23 Encounter for immunization: Secondary | ICD-10-CM | POA: Diagnosis not present

## 2015-04-03 DIAGNOSIS — E063 Autoimmune thyroiditis: Secondary | ICD-10-CM | POA: Diagnosis not present

## 2015-04-03 DIAGNOSIS — E782 Mixed hyperlipidemia: Secondary | ICD-10-CM | POA: Diagnosis not present

## 2015-04-03 DIAGNOSIS — E663 Overweight: Secondary | ICD-10-CM | POA: Diagnosis not present

## 2015-04-03 DIAGNOSIS — Z1389 Encounter for screening for other disorder: Secondary | ICD-10-CM | POA: Diagnosis not present

## 2015-04-03 DIAGNOSIS — Z6827 Body mass index (BMI) 27.0-27.9, adult: Secondary | ICD-10-CM | POA: Diagnosis not present

## 2015-04-04 DIAGNOSIS — Z6827 Body mass index (BMI) 27.0-27.9, adult: Secondary | ICD-10-CM | POA: Diagnosis not present

## 2015-04-04 DIAGNOSIS — Z1389 Encounter for screening for other disorder: Secondary | ICD-10-CM | POA: Diagnosis not present

## 2015-04-04 DIAGNOSIS — E063 Autoimmune thyroiditis: Secondary | ICD-10-CM | POA: Diagnosis not present

## 2015-04-04 DIAGNOSIS — E782 Mixed hyperlipidemia: Secondary | ICD-10-CM | POA: Diagnosis not present

## 2015-04-15 DIAGNOSIS — E663 Overweight: Secondary | ICD-10-CM | POA: Diagnosis not present

## 2015-04-15 DIAGNOSIS — Z6826 Body mass index (BMI) 26.0-26.9, adult: Secondary | ICD-10-CM | POA: Diagnosis not present

## 2015-04-15 DIAGNOSIS — E039 Hypothyroidism, unspecified: Secondary | ICD-10-CM | POA: Diagnosis not present

## 2015-04-15 DIAGNOSIS — Z1389 Encounter for screening for other disorder: Secondary | ICD-10-CM | POA: Diagnosis not present

## 2015-04-17 ENCOUNTER — Other Ambulatory Visit (HOSPITAL_COMMUNITY): Payer: Self-pay | Admitting: Family Medicine

## 2015-04-17 ENCOUNTER — Other Ambulatory Visit (HOSPITAL_COMMUNITY): Payer: Self-pay | Admitting: Physician Assistant

## 2015-04-17 DIAGNOSIS — M8580 Other specified disorders of bone density and structure, unspecified site: Secondary | ICD-10-CM

## 2015-04-17 DIAGNOSIS — M898X9 Other specified disorders of bone, unspecified site: Secondary | ICD-10-CM

## 2015-04-24 ENCOUNTER — Ambulatory Visit (HOSPITAL_COMMUNITY)
Admission: RE | Admit: 2015-04-24 | Discharge: 2015-04-24 | Disposition: A | Discharge status: Home / Self Care | Payer: Medicare Other | Source: Ambulatory Visit | Attending: Family Medicine | Admitting: Family Medicine

## 2015-04-24 DIAGNOSIS — M898X9 Other specified disorders of bone, unspecified site: Secondary | ICD-10-CM | POA: Diagnosis not present

## 2015-04-24 DIAGNOSIS — Z780 Asymptomatic menopausal state: Secondary | ICD-10-CM | POA: Diagnosis not present

## 2015-04-24 DIAGNOSIS — M810 Age-related osteoporosis without current pathological fracture: Secondary | ICD-10-CM | POA: Insufficient documentation

## 2015-04-24 DIAGNOSIS — M8580 Other specified disorders of bone density and structure, unspecified site: Secondary | ICD-10-CM

## 2016-01-24 DIAGNOSIS — E785 Hyperlipidemia, unspecified: Secondary | ICD-10-CM | POA: Diagnosis not present

## 2016-01-24 DIAGNOSIS — Z0000 Encounter for general adult medical examination without abnormal findings: Secondary | ICD-10-CM | POA: Diagnosis not present

## 2016-01-24 DIAGNOSIS — E039 Hypothyroidism, unspecified: Secondary | ICD-10-CM | POA: Diagnosis not present

## 2016-02-03 ENCOUNTER — Other Ambulatory Visit: Payer: Self-pay | Admitting: Family Medicine

## 2016-02-03 DIAGNOSIS — Z1231 Encounter for screening mammogram for malignant neoplasm of breast: Secondary | ICD-10-CM

## 2016-03-12 ENCOUNTER — Ambulatory Visit
Admission: RE | Admit: 2016-03-12 | Discharge: 2016-03-12 | Disposition: A | Discharge status: Home / Self Care | Payer: Medicare HMO | Source: Ambulatory Visit | Attending: Family Medicine | Admitting: Family Medicine

## 2016-03-12 DIAGNOSIS — Z1231 Encounter for screening mammogram for malignant neoplasm of breast: Secondary | ICD-10-CM | POA: Diagnosis not present

## 2016-03-26 DIAGNOSIS — E039 Hypothyroidism, unspecified: Secondary | ICD-10-CM | POA: Diagnosis not present

## 2016-03-26 DIAGNOSIS — Z1389 Encounter for screening for other disorder: Secondary | ICD-10-CM | POA: Diagnosis not present

## 2016-03-26 DIAGNOSIS — Z23 Encounter for immunization: Secondary | ICD-10-CM | POA: Diagnosis not present

## 2016-03-26 DIAGNOSIS — Z6826 Body mass index (BMI) 26.0-26.9, adult: Secondary | ICD-10-CM | POA: Diagnosis not present

## 2016-03-26 DIAGNOSIS — E782 Mixed hyperlipidemia: Secondary | ICD-10-CM | POA: Diagnosis not present

## 2016-03-26 DIAGNOSIS — E663 Overweight: Secondary | ICD-10-CM | POA: Diagnosis not present

## 2016-10-12 DIAGNOSIS — Z1389 Encounter for screening for other disorder: Secondary | ICD-10-CM | POA: Diagnosis not present

## 2016-10-12 DIAGNOSIS — Z6826 Body mass index (BMI) 26.0-26.9, adult: Secondary | ICD-10-CM | POA: Diagnosis not present

## 2016-10-12 DIAGNOSIS — A493 Mycoplasma infection, unspecified site: Secondary | ICD-10-CM | POA: Diagnosis not present

## 2016-10-12 DIAGNOSIS — E663 Overweight: Secondary | ICD-10-CM | POA: Diagnosis not present

## 2016-10-12 DIAGNOSIS — J029 Acute pharyngitis, unspecified: Secondary | ICD-10-CM | POA: Diagnosis not present

## 2016-10-12 DIAGNOSIS — J069 Acute upper respiratory infection, unspecified: Secondary | ICD-10-CM | POA: Diagnosis not present

## 2017-02-16 ENCOUNTER — Other Ambulatory Visit: Payer: Self-pay | Admitting: Family Medicine

## 2017-02-16 DIAGNOSIS — Z1231 Encounter for screening mammogram for malignant neoplasm of breast: Secondary | ICD-10-CM

## 2017-03-15 ENCOUNTER — Ambulatory Visit: Payer: Medicare Other

## 2017-03-16 ENCOUNTER — Ambulatory Visit
Admission: RE | Admit: 2017-03-16 | Discharge: 2017-03-16 | Disposition: A | Discharge status: Home / Self Care | Payer: Medicare Other | Source: Ambulatory Visit | Attending: Family Medicine | Admitting: Family Medicine

## 2017-03-16 DIAGNOSIS — Z1231 Encounter for screening mammogram for malignant neoplasm of breast: Secondary | ICD-10-CM

## 2017-03-25 DIAGNOSIS — E663 Overweight: Secondary | ICD-10-CM | POA: Diagnosis not present

## 2017-03-25 DIAGNOSIS — Z1389 Encounter for screening for other disorder: Secondary | ICD-10-CM | POA: Diagnosis not present

## 2017-03-25 DIAGNOSIS — M179 Osteoarthritis of knee, unspecified: Secondary | ICD-10-CM | POA: Diagnosis not present

## 2017-03-25 DIAGNOSIS — E039 Hypothyroidism, unspecified: Secondary | ICD-10-CM | POA: Diagnosis not present

## 2017-03-25 DIAGNOSIS — Z6827 Body mass index (BMI) 27.0-27.9, adult: Secondary | ICD-10-CM | POA: Diagnosis not present

## 2017-03-25 DIAGNOSIS — Z23 Encounter for immunization: Secondary | ICD-10-CM | POA: Diagnosis not present

## 2017-03-25 DIAGNOSIS — E782 Mixed hyperlipidemia: Secondary | ICD-10-CM | POA: Diagnosis not present

## 2017-03-25 DIAGNOSIS — Z0000 Encounter for general adult medical examination without abnormal findings: Secondary | ICD-10-CM | POA: Diagnosis not present

## 2017-03-29 DIAGNOSIS — Z1211 Encounter for screening for malignant neoplasm of colon: Secondary | ICD-10-CM | POA: Diagnosis not present

## 2017-11-12 DIAGNOSIS — Z09 Encounter for follow-up examination after completed treatment for conditions other than malignant neoplasm: Secondary | ICD-10-CM | POA: Diagnosis not present

## 2017-11-12 DIAGNOSIS — M25562 Pain in left knee: Secondary | ICD-10-CM | POA: Diagnosis not present

## 2017-11-12 DIAGNOSIS — Z96652 Presence of left artificial knee joint: Secondary | ICD-10-CM | POA: Diagnosis not present

## 2018-03-22 DIAGNOSIS — Z23 Encounter for immunization: Secondary | ICD-10-CM | POA: Diagnosis not present

## 2018-04-11 DIAGNOSIS — Z1389 Encounter for screening for other disorder: Secondary | ICD-10-CM | POA: Diagnosis not present

## 2018-04-11 DIAGNOSIS — E748 Other specified disorders of carbohydrate metabolism: Secondary | ICD-10-CM | POA: Diagnosis not present

## 2018-04-11 DIAGNOSIS — E663 Overweight: Secondary | ICD-10-CM | POA: Diagnosis not present

## 2018-04-11 DIAGNOSIS — Z6826 Body mass index (BMI) 26.0-26.9, adult: Secondary | ICD-10-CM | POA: Diagnosis not present

## 2018-04-11 DIAGNOSIS — E063 Autoimmune thyroiditis: Secondary | ICD-10-CM | POA: Diagnosis not present

## 2018-04-11 DIAGNOSIS — Z0001 Encounter for general adult medical examination with abnormal findings: Secondary | ICD-10-CM | POA: Diagnosis not present

## 2018-04-11 DIAGNOSIS — M1991 Primary osteoarthritis, unspecified site: Secondary | ICD-10-CM | POA: Diagnosis not present

## 2018-04-11 DIAGNOSIS — R7309 Other abnormal glucose: Secondary | ICD-10-CM | POA: Diagnosis not present

## 2018-04-11 DIAGNOSIS — Z0000 Encounter for general adult medical examination without abnormal findings: Secondary | ICD-10-CM | POA: Diagnosis not present

## 2018-04-14 DIAGNOSIS — Z1211 Encounter for screening for malignant neoplasm of colon: Secondary | ICD-10-CM | POA: Diagnosis not present

## 2018-05-02 ENCOUNTER — Other Ambulatory Visit: Payer: Self-pay | Admitting: Family Medicine

## 2018-05-02 DIAGNOSIS — Z1231 Encounter for screening mammogram for malignant neoplasm of breast: Secondary | ICD-10-CM

## 2018-06-13 ENCOUNTER — Ambulatory Visit
Admission: RE | Admit: 2018-06-13 | Discharge: 2018-06-13 | Disposition: A | Discharge status: Home / Self Care | Payer: Medicare HMO | Source: Ambulatory Visit | Attending: Family Medicine | Admitting: Family Medicine

## 2018-06-13 DIAGNOSIS — Z1231 Encounter for screening mammogram for malignant neoplasm of breast: Secondary | ICD-10-CM

## 2019-03-01 DIAGNOSIS — Z23 Encounter for immunization: Secondary | ICD-10-CM | POA: Diagnosis not present

## 2019-05-16 DIAGNOSIS — Z6827 Body mass index (BMI) 27.0-27.9, adult: Secondary | ICD-10-CM | POA: Diagnosis not present

## 2019-05-16 DIAGNOSIS — Z0000 Encounter for general adult medical examination without abnormal findings: Secondary | ICD-10-CM | POA: Diagnosis not present

## 2019-05-16 DIAGNOSIS — E039 Hypothyroidism, unspecified: Secondary | ICD-10-CM | POA: Diagnosis not present

## 2019-05-16 DIAGNOSIS — Z1389 Encounter for screening for other disorder: Secondary | ICD-10-CM | POA: Diagnosis not present

## 2019-05-16 DIAGNOSIS — E7849 Other hyperlipidemia: Secondary | ICD-10-CM | POA: Diagnosis not present

## 2019-05-16 DIAGNOSIS — M179 Osteoarthritis of knee, unspecified: Secondary | ICD-10-CM | POA: Diagnosis not present

## 2019-05-16 DIAGNOSIS — E663 Overweight: Secondary | ICD-10-CM | POA: Diagnosis not present

## 2019-05-20 DIAGNOSIS — Z1211 Encounter for screening for malignant neoplasm of colon: Secondary | ICD-10-CM | POA: Diagnosis not present

## 2019-08-14 ENCOUNTER — Other Ambulatory Visit: Payer: Self-pay | Admitting: Family Medicine

## 2019-08-14 DIAGNOSIS — Z1231 Encounter for screening mammogram for malignant neoplasm of breast: Secondary | ICD-10-CM

## 2019-08-21 DIAGNOSIS — E039 Hypothyroidism, unspecified: Secondary | ICD-10-CM | POA: Diagnosis not present

## 2019-09-19 ENCOUNTER — Other Ambulatory Visit: Payer: Self-pay

## 2019-09-19 ENCOUNTER — Ambulatory Visit
Admission: RE | Admit: 2019-09-19 | Discharge: 2019-09-19 | Disposition: A | Discharge status: Home / Self Care | Payer: Medicare HMO | Source: Ambulatory Visit | Attending: Family Medicine | Admitting: Family Medicine

## 2019-09-19 DIAGNOSIS — Z1231 Encounter for screening mammogram for malignant neoplasm of breast: Secondary | ICD-10-CM | POA: Diagnosis not present

## 2019-10-18 DIAGNOSIS — Z0100 Encounter for examination of eyes and vision without abnormal findings: Secondary | ICD-10-CM | POA: Diagnosis not present

## 2019-11-03 DIAGNOSIS — M1991 Primary osteoarthritis, unspecified site: Secondary | ICD-10-CM | POA: Diagnosis not present

## 2019-11-03 DIAGNOSIS — E039 Hypothyroidism, unspecified: Secondary | ICD-10-CM | POA: Diagnosis not present

## 2019-11-03 DIAGNOSIS — Z6827 Body mass index (BMI) 27.0-27.9, adult: Secondary | ICD-10-CM | POA: Diagnosis not present

## 2019-11-03 DIAGNOSIS — E7849 Other hyperlipidemia: Secondary | ICD-10-CM | POA: Diagnosis not present

## 2019-11-03 DIAGNOSIS — E663 Overweight: Secondary | ICD-10-CM | POA: Diagnosis not present

## 2019-11-03 DIAGNOSIS — J302 Other seasonal allergic rhinitis: Secondary | ICD-10-CM | POA: Diagnosis not present

## 2019-11-09 DIAGNOSIS — Z791 Long term (current) use of non-steroidal anti-inflammatories (NSAID): Secondary | ICD-10-CM | POA: Diagnosis not present

## 2019-11-09 DIAGNOSIS — Z7722 Contact with and (suspected) exposure to environmental tobacco smoke (acute) (chronic): Secondary | ICD-10-CM | POA: Diagnosis not present

## 2019-11-09 DIAGNOSIS — Z806 Family history of leukemia: Secondary | ICD-10-CM | POA: Diagnosis not present

## 2019-11-09 DIAGNOSIS — G8929 Other chronic pain: Secondary | ICD-10-CM | POA: Diagnosis not present

## 2019-11-09 DIAGNOSIS — I951 Orthostatic hypotension: Secondary | ICD-10-CM | POA: Diagnosis not present

## 2019-11-09 DIAGNOSIS — R030 Elevated blood-pressure reading, without diagnosis of hypertension: Secondary | ICD-10-CM | POA: Diagnosis not present

## 2019-11-09 DIAGNOSIS — M1990 Unspecified osteoarthritis, unspecified site: Secondary | ICD-10-CM | POA: Diagnosis not present

## 2019-11-09 DIAGNOSIS — E039 Hypothyroidism, unspecified: Secondary | ICD-10-CM | POA: Diagnosis not present

## 2019-11-09 DIAGNOSIS — K219 Gastro-esophageal reflux disease without esophagitis: Secondary | ICD-10-CM | POA: Diagnosis not present

## 2019-11-09 DIAGNOSIS — E785 Hyperlipidemia, unspecified: Secondary | ICD-10-CM | POA: Diagnosis not present

## 2019-12-22 DIAGNOSIS — E039 Hypothyroidism, unspecified: Secondary | ICD-10-CM | POA: Diagnosis not present

## 2020-02-24 DIAGNOSIS — R69 Illness, unspecified: Secondary | ICD-10-CM | POA: Diagnosis not present

## 2020-04-10 DIAGNOSIS — Z681 Body mass index (BMI) 19 or less, adult: Secondary | ICD-10-CM | POA: Diagnosis not present

## 2020-04-10 DIAGNOSIS — J22 Unspecified acute lower respiratory infection: Secondary | ICD-10-CM | POA: Diagnosis not present

## 2020-05-10 DIAGNOSIS — E7849 Other hyperlipidemia: Secondary | ICD-10-CM | POA: Diagnosis not present

## 2020-05-10 DIAGNOSIS — Z0000 Encounter for general adult medical examination without abnormal findings: Secondary | ICD-10-CM | POA: Diagnosis not present

## 2020-05-10 DIAGNOSIS — Z1331 Encounter for screening for depression: Secondary | ICD-10-CM | POA: Diagnosis not present

## 2020-05-10 DIAGNOSIS — Z1389 Encounter for screening for other disorder: Secondary | ICD-10-CM | POA: Diagnosis not present

## 2020-05-10 DIAGNOSIS — E039 Hypothyroidism, unspecified: Secondary | ICD-10-CM | POA: Diagnosis not present

## 2020-05-10 DIAGNOSIS — M1991 Primary osteoarthritis, unspecified site: Secondary | ICD-10-CM | POA: Diagnosis not present

## 2020-05-10 DIAGNOSIS — M179 Osteoarthritis of knee, unspecified: Secondary | ICD-10-CM | POA: Diagnosis not present

## 2020-05-10 DIAGNOSIS — Z6826 Body mass index (BMI) 26.0-26.9, adult: Secondary | ICD-10-CM | POA: Diagnosis not present

## 2020-06-26 DIAGNOSIS — E039 Hypothyroidism, unspecified: Secondary | ICD-10-CM | POA: Diagnosis not present

## 2020-08-22 ENCOUNTER — Other Ambulatory Visit: Payer: Self-pay | Admitting: Family Medicine

## 2020-08-22 DIAGNOSIS — E039 Hypothyroidism, unspecified: Secondary | ICD-10-CM | POA: Diagnosis not present

## 2020-08-22 DIAGNOSIS — Z1231 Encounter for screening mammogram for malignant neoplasm of breast: Secondary | ICD-10-CM

## 2020-10-08 DIAGNOSIS — E039 Hypothyroidism, unspecified: Secondary | ICD-10-CM | POA: Diagnosis not present

## 2020-10-14 ENCOUNTER — Other Ambulatory Visit: Payer: Self-pay

## 2020-10-14 ENCOUNTER — Ambulatory Visit
Admission: RE | Admit: 2020-10-14 | Discharge: 2020-10-14 | Disposition: A | Discharge status: Home / Self Care | Payer: Medicare HMO | Source: Ambulatory Visit | Attending: Family Medicine | Admitting: Family Medicine

## 2020-10-14 DIAGNOSIS — Z1231 Encounter for screening mammogram for malignant neoplasm of breast: Secondary | ICD-10-CM | POA: Diagnosis not present

## 2020-11-04 DIAGNOSIS — Z1331 Encounter for screening for depression: Secondary | ICD-10-CM | POA: Diagnosis not present

## 2020-11-04 DIAGNOSIS — M542 Cervicalgia: Secondary | ICD-10-CM | POA: Diagnosis not present

## 2020-11-04 DIAGNOSIS — M5030 Other cervical disc degeneration, unspecified cervical region: Secondary | ICD-10-CM | POA: Diagnosis not present

## 2020-11-04 DIAGNOSIS — Z6827 Body mass index (BMI) 27.0-27.9, adult: Secondary | ICD-10-CM | POA: Diagnosis not present

## 2020-11-04 DIAGNOSIS — E663 Overweight: Secondary | ICD-10-CM | POA: Diagnosis not present

## 2020-11-19 DIAGNOSIS — E039 Hypothyroidism, unspecified: Secondary | ICD-10-CM | POA: Diagnosis not present

## 2021-03-06 DIAGNOSIS — Z23 Encounter for immunization: Secondary | ICD-10-CM | POA: Diagnosis not present

## 2021-06-25 DIAGNOSIS — R7309 Other abnormal glucose: Secondary | ICD-10-CM | POA: Diagnosis not present

## 2021-06-25 DIAGNOSIS — E7849 Other hyperlipidemia: Secondary | ICD-10-CM | POA: Diagnosis not present

## 2021-06-25 DIAGNOSIS — Z1331 Encounter for screening for depression: Secondary | ICD-10-CM | POA: Diagnosis not present

## 2021-06-25 DIAGNOSIS — Z6826 Body mass index (BMI) 26.0-26.9, adult: Secondary | ICD-10-CM | POA: Diagnosis not present

## 2021-06-25 DIAGNOSIS — J302 Other seasonal allergic rhinitis: Secondary | ICD-10-CM | POA: Diagnosis not present

## 2021-06-25 DIAGNOSIS — E039 Hypothyroidism, unspecified: Secondary | ICD-10-CM | POA: Diagnosis not present

## 2021-06-25 DIAGNOSIS — M179 Osteoarthritis of knee, unspecified: Secondary | ICD-10-CM | POA: Diagnosis not present

## 2021-06-25 DIAGNOSIS — E663 Overweight: Secondary | ICD-10-CM | POA: Diagnosis not present

## 2021-06-25 DIAGNOSIS — E782 Mixed hyperlipidemia: Secondary | ICD-10-CM | POA: Diagnosis not present

## 2021-06-25 DIAGNOSIS — Z0000 Encounter for general adult medical examination without abnormal findings: Secondary | ICD-10-CM | POA: Diagnosis not present

## 2021-06-28 DIAGNOSIS — Z1211 Encounter for screening for malignant neoplasm of colon: Secondary | ICD-10-CM | POA: Diagnosis not present

## 2021-11-05 ENCOUNTER — Other Ambulatory Visit: Payer: Self-pay | Admitting: Family Medicine

## 2021-11-05 DIAGNOSIS — Z1231 Encounter for screening mammogram for malignant neoplasm of breast: Secondary | ICD-10-CM

## 2021-11-10 ENCOUNTER — Ambulatory Visit
Admission: RE | Admit: 2021-11-10 | Discharge: 2021-11-10 | Disposition: A | Discharge status: Home / Self Care | Payer: Medicare Other | Source: Ambulatory Visit | Attending: Family Medicine | Admitting: Family Medicine

## 2021-11-10 DIAGNOSIS — Z1231 Encounter for screening mammogram for malignant neoplasm of breast: Secondary | ICD-10-CM | POA: Diagnosis not present

## 2022-01-29 DIAGNOSIS — M5030 Other cervical disc degeneration, unspecified cervical region: Secondary | ICD-10-CM | POA: Diagnosis not present

## 2022-01-29 DIAGNOSIS — Z0000 Encounter for general adult medical examination without abnormal findings: Secondary | ICD-10-CM | POA: Diagnosis not present

## 2022-01-29 DIAGNOSIS — M179 Osteoarthritis of knee, unspecified: Secondary | ICD-10-CM | POA: Diagnosis not present

## 2022-01-29 DIAGNOSIS — Z1211 Encounter for screening for malignant neoplasm of colon: Secondary | ICD-10-CM | POA: Diagnosis not present

## 2022-01-29 DIAGNOSIS — E782 Mixed hyperlipidemia: Secondary | ICD-10-CM | POA: Diagnosis not present

## 2022-01-29 DIAGNOSIS — E039 Hypothyroidism, unspecified: Secondary | ICD-10-CM | POA: Diagnosis not present

## 2022-01-29 DIAGNOSIS — M1991 Primary osteoarthritis, unspecified site: Secondary | ICD-10-CM | POA: Diagnosis not present

## 2022-01-29 DIAGNOSIS — J302 Other seasonal allergic rhinitis: Secondary | ICD-10-CM | POA: Diagnosis not present

## 2022-01-29 DIAGNOSIS — R7309 Other abnormal glucose: Secondary | ICD-10-CM | POA: Diagnosis not present

## 2022-01-29 DIAGNOSIS — E7849 Other hyperlipidemia: Secondary | ICD-10-CM | POA: Diagnosis not present

## 2022-03-03 DIAGNOSIS — Z23 Encounter for immunization: Secondary | ICD-10-CM | POA: Diagnosis not present

## 2022-03-27 DIAGNOSIS — R030 Elevated blood-pressure reading, without diagnosis of hypertension: Secondary | ICD-10-CM | POA: Diagnosis not present

## 2022-03-27 DIAGNOSIS — R21 Rash and other nonspecific skin eruption: Secondary | ICD-10-CM | POA: Diagnosis not present

## 2022-04-10 DIAGNOSIS — L509 Urticaria, unspecified: Secondary | ICD-10-CM | POA: Diagnosis not present

## 2022-04-19 DIAGNOSIS — S62346A Nondisplaced fracture of base of fifth metacarpal bone, right hand, initial encounter for closed fracture: Secondary | ICD-10-CM | POA: Diagnosis not present

## 2022-12-02 ENCOUNTER — Other Ambulatory Visit: Payer: Self-pay | Admitting: Family Medicine

## 2022-12-02 DIAGNOSIS — Z1231 Encounter for screening mammogram for malignant neoplasm of breast: Secondary | ICD-10-CM

## 2023-01-14 ENCOUNTER — Ambulatory Visit
Admission: RE | Admit: 2023-01-14 | Discharge: 2023-01-14 | Disposition: A | Discharge status: Home / Self Care | Payer: 59 | Source: Ambulatory Visit | Attending: Family Medicine | Admitting: Family Medicine

## 2023-01-14 DIAGNOSIS — Z1231 Encounter for screening mammogram for malignant neoplasm of breast: Secondary | ICD-10-CM | POA: Diagnosis not present

## 2023-02-11 DIAGNOSIS — R7309 Other abnormal glucose: Secondary | ICD-10-CM | POA: Diagnosis not present

## 2023-02-11 DIAGNOSIS — E039 Hypothyroidism, unspecified: Secondary | ICD-10-CM | POA: Diagnosis not present

## 2023-02-11 DIAGNOSIS — Z0000 Encounter for general adult medical examination without abnormal findings: Secondary | ICD-10-CM | POA: Diagnosis not present

## 2023-02-11 DIAGNOSIS — E7849 Other hyperlipidemia: Secondary | ICD-10-CM | POA: Diagnosis not present

## 2023-03-29 DIAGNOSIS — H04123 Dry eye syndrome of bilateral lacrimal glands: Secondary | ICD-10-CM | POA: Diagnosis not present

## 2023-06-01 DIAGNOSIS — S20212A Contusion of left front wall of thorax, initial encounter: Secondary | ICD-10-CM | POA: Diagnosis not present

## 2023-06-01 DIAGNOSIS — R030 Elevated blood-pressure reading, without diagnosis of hypertension: Secondary | ICD-10-CM | POA: Diagnosis not present

## 2023-10-25 DIAGNOSIS — E039 Hypothyroidism, unspecified: Secondary | ICD-10-CM | POA: Diagnosis not present

## 2023-11-09 IMAGING — MG MM DIGITAL SCREENING BILAT W/ TOMO AND CAD
6 of 10 series · 6 of 30 positions shown · non-contrast
Comparison: Previous exam(s).

CLINICAL DATA: Screening.

EXAM:
DIGITAL SCREENING BILATERAL MAMMOGRAM WITH TOMOSYNTHESIS AND CAD
TECHNIQUE: Bilateral screening digital craniocaudal and mediolateral oblique
mammograms were obtained. Bilateral screening digital breast
tomosynthesis was performed. The images were evaluated with
computer-aided detection. Best images possible per technologist
communication.

[L CC synth-2D]
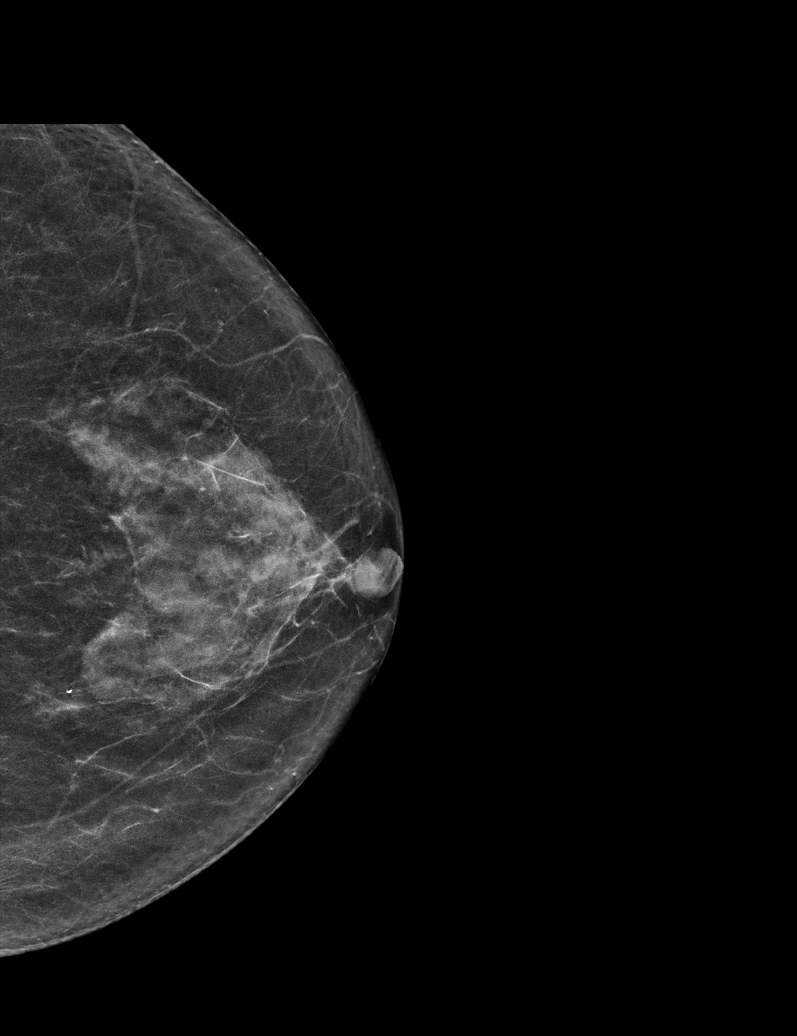

[L MLO synth-2D]
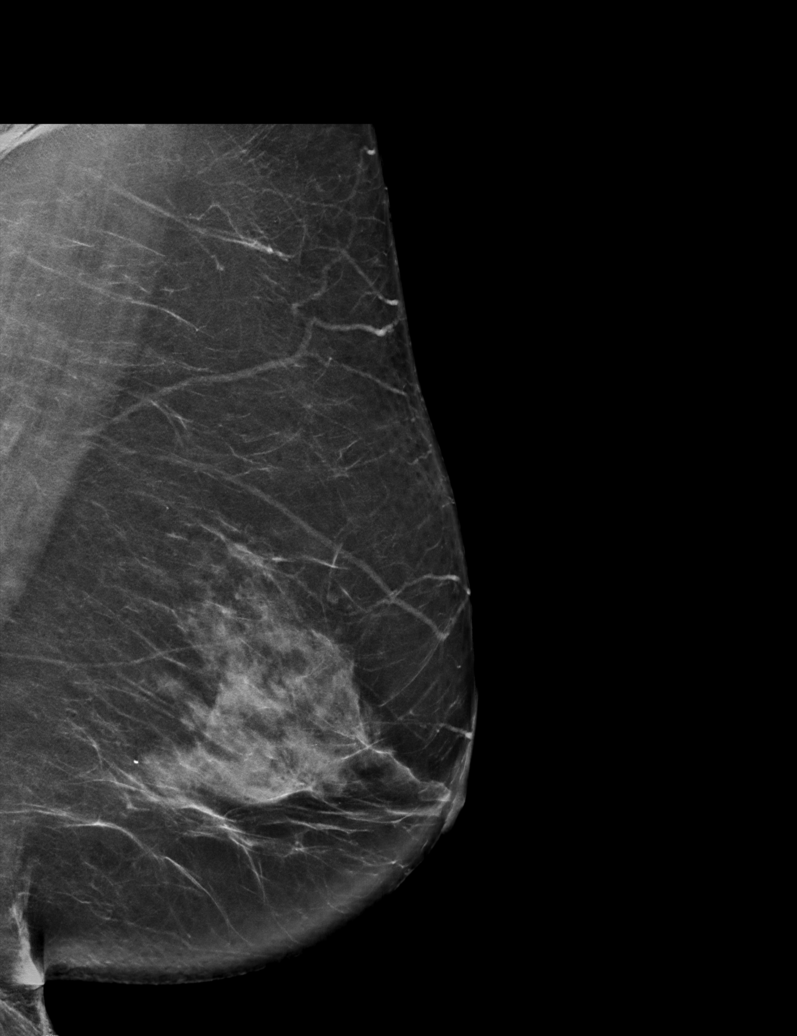

[R CC synth-2D]
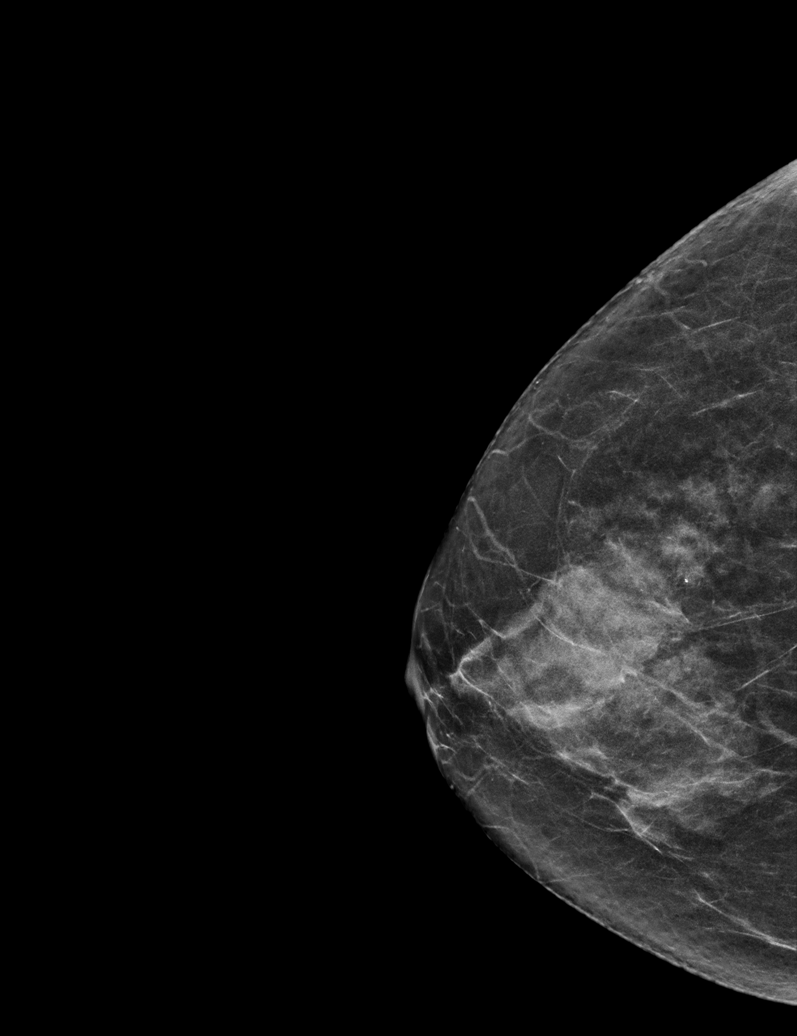

[R MLO synth-2D (1 of 2)]
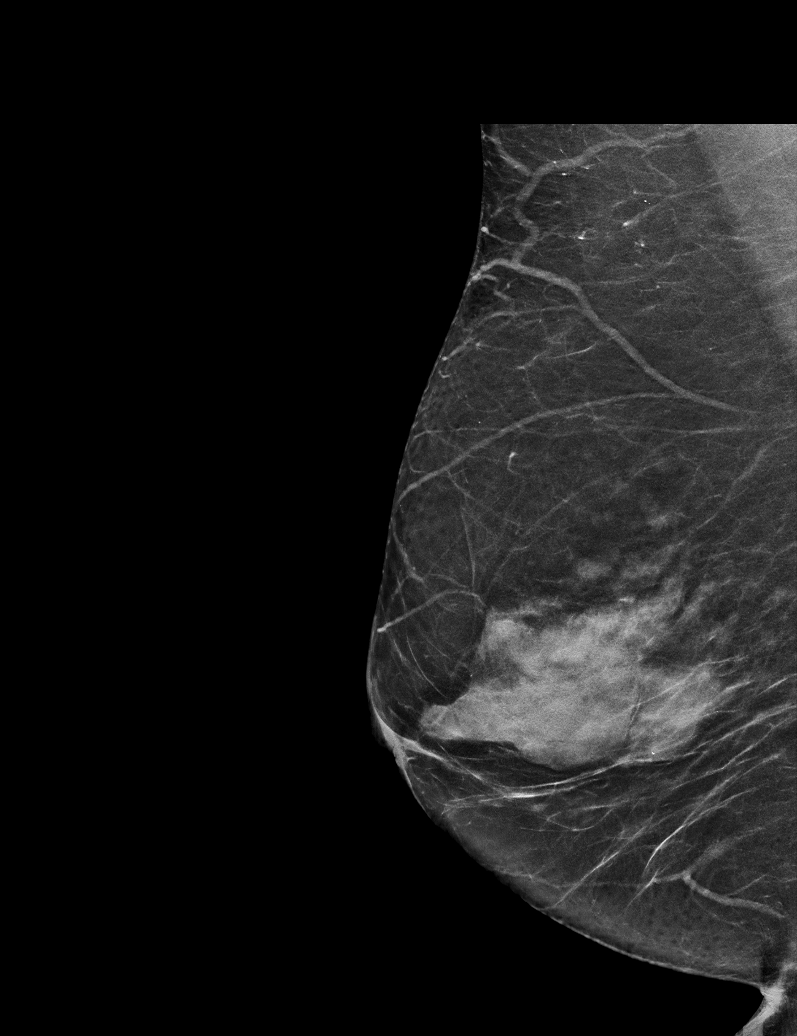

[R MLO synth-2D (2 of 2)]
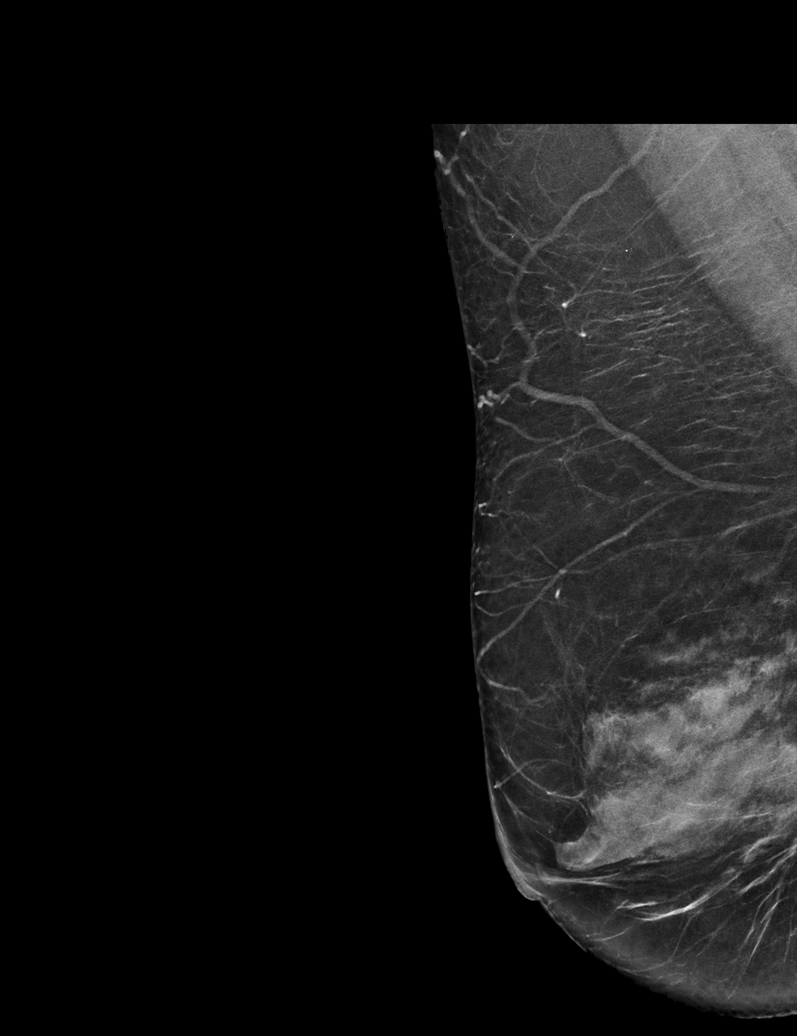

[R MLO tomo · tomo slice 29/57.0]
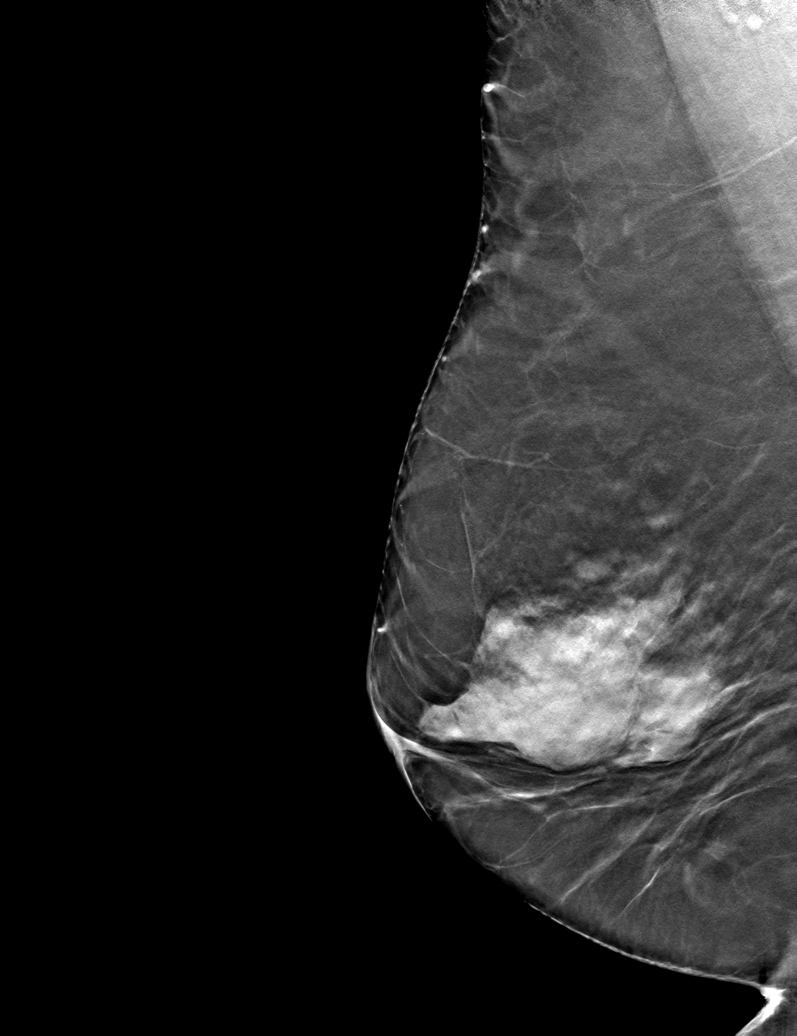

[6 of 30 positions shown; findings below may reference images not displayed]

ACR Breast Density Category c: The breast tissue is heterogeneously
dense, which may obscure small masses.
FINDINGS: There are no findings suspicious for malignancy.
IMPRESSION: No mammographic evidence of malignancy. A result letter of this
screening mammogram will be mailed directly to the patient.

RECOMMENDATION:
Screening mammogram in one year. (Code:[id number]-[id number])

BI-RADS CATEGORY  1: Negative.

## 2023-12-02 ENCOUNTER — Encounter (HOSPITAL_COMMUNITY): Payer: Self-pay

## 2023-12-02 ENCOUNTER — Other Ambulatory Visit: Payer: Self-pay

## 2023-12-02 ENCOUNTER — Ambulatory Visit (HOSPITAL_COMMUNITY): Attending: Family Medicine

## 2023-12-02 DIAGNOSIS — Z7409 Other reduced mobility: Secondary | ICD-10-CM | POA: Insufficient documentation

## 2023-12-02 DIAGNOSIS — M6281 Muscle weakness (generalized): Secondary | ICD-10-CM | POA: Diagnosis not present

## 2023-12-02 NOTE — Therapy (Signed)
 OUTPATIENT PHYSICAL THERAPY LOWER EXTREMITY EVALUATION   Patient Name: Donna Daniels MRN: [id number] DOB:09/29/1940, 83 y.o., female Today's Date: 12/02/2023  END OF SESSION:  PT End of Session - 12/02/23 0931     Visit Number 1    Number of Visits 12    Date for PT Re-Evaluation 12/30/23    Authorization Type [employer] Dual complete    Authorization Time Period No auth    Progress Note Due on Visit 10    PT Start Time 0933    PT Stop Time 1022    PT Time Calculation (min) 49 min    Activity Tolerance Patient tolerated treatment well    Behavior During Therapy WFL for tasks assessed/performed             Past Medical History:  Diagnosis Date   Arthritis    Bruises easily    Cataracts, bilateral    High cholesterol    takes SImvastin  daily   History of bronchitis    last time about 46yrs ago   Hypothyroidism    takes Synthroid  daily   Joint pain    Joint swelling    PONV (postoperative nausea and vomiting)    Sciatica    Thyroid  disorder    Past Surgical History:  Procedure Laterality Date   ABDOMINAL HYSTERECTOMY     ANTERIOR CERVICAL DECOMP/DISCECTOMY FUSION  12/18/2011   Procedure: ANTERIOR CERVICAL DECOMPRESSION/DISCECTOMY FUSION 2 LEVELS;  Surgeon: Augustine Blocker, MD;  Location: [facility];  Service: Neurosurgery;  Laterality: N/A;  Anterior cervical decompression fusion four-six   CESAREAN SECTION     x 1    COLONOSCOPY     HAND SURGERY Left    THYROIDECTOMY     TOTAL KNEE ARTHROPLASTY Right 04/18/2013   Procedure: TOTAL KNEE ARTHROPLASTY;  Surgeon: Alphonzo Ask, MD;  Location: [facility];  Service: Orthopedics;  Laterality: Right;   TOTAL KNEE ARTHROPLASTY Left 07/24/2013   DR DALLDORF   TOTAL KNEE ARTHROPLASTY Left 07/25/2013   Procedure: TOTAL KNEE ARTHROPLASTY;  Surgeon: Alphonzo Ask, MD;  Location: [facility] OR;  Service: Orthopedics;  Laterality: Left;   VESICOVAGINAL FISTULA CLOSURE W/ TAH     Patient Active Problem List   Diagnosis Date Noted    Abnormality of gait 08/17/2013   Stiffness of left  knee 08/17/2013   S/P total knee replacement 07/25/2013   Stiffness of joint, not elsewhere classified, lower leg 05/03/2013   Difficulty walking 05/03/2013   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 Struggles to get dressed (takes her more time), struggles to get up from chair after sitting for a while, and struggles opening jars. Report she would walk with family last year before she fell. Used a walking stick when walking. Now has a fear of falling. Reports no pain, just strength deficits.  PERTINENT HISTORY: 2 TKA L hand reconstruction, ~7829 C-Section Thyroidectomy Hysterectomy  PAIN:  Are you having pain? No  PRECAUTIONS: None  RED FLAGS: None   WEIGHT BEARING RESTRICTIONS: No  FALLS:  Has patient fallen in last 6 months? Yes. Number of falls 2, due to tripping over cat and dizziness (side effect from medication, no longer taking)   LIVING ENVIRONMENT: Lives with: lives alone Stairs: Yes: External: 3 steps; can reach both Has following equipment at home: None  OCCUPATION: Retired  PLOF: Independent  PATIENT GOALS: "Want to improve strength in hands and arms, wants to get better with sit to stand"  NEXT MD VISIT: August 2025  OBJECTIVE:  Note: Objective measures were completed at Evaluation unless otherwise noted.  DIAGNOSTIC FINDINGS: N/A  PATIENT SURVEYS:  Lower Extremity Functional Score: 55 / 80 = 68.8  % QuickDASH Score: 36.4 / 100 = 36.4 %  COGNITION: Overall cognitive status: Within functional limits for tasks assessed     SENSATION: Light touch: Impaired  on L2 on R dermatome  POSTURE: rounded shoulders, forward head, and increased thoracic kyphosis    LOWER EXTREMITY MMT:  MMT Right eval Left eval  Hip flexion 4 4+  Hip extension 3+ 3+  Hip abduction 4 4  Hip adduction    Hip internal rotation    Hip external rotation    Knee flexion    Knee extension    Ankle dorsiflexion 5 5  Ankle plantarflexion    Ankle inversion    Ankle eversion     (Blank rows = not tested) UPPER EXTREMITY ROM:   Active ROM Right eval Left eval  Shoulder flexion 118 118  Shoulder extension    Shoulder abduction 120 120  Shoulder adduction    Shoulder internal rotation    Shoulder external rotation    Elbow flexion    Elbow extension    Wrist flexion    Wrist extension    Wrist ulnar deviation    Wrist radial deviation    Wrist pronation    Wrist supination     UPPER EXTREMITY MMT:  MMT Right eval Left eval  Shoulder flexion 4- 4-  Shoulder extension    Shoulder abduction 4- 4-  Shoulder adduction    Shoulder internal rotation 3+ 3+  Shoulder external rotation 3+ 3+  Middle trapezius    Lower trapezius    Elbow flexion    Elbow extension    Wrist flexion    Wrist extension    Wrist ulnar deviation    Wrist radial deviation    Wrist pronation    Wrist supination    Grip strength (lbs)    (Blank rows = not tested) (Blank rows = not tested)   FUNCTIONAL TESTS:  2 minute walk test: Next session Grip Strength: R: 25 lb  L: 20 lb 30 seconds chair stand: 4 STS, UE pushing from LE  GAIT: Distance walked: 60 Assistive device utilized: None Level of assistance: Complete Independence Comments: Dec velocity, dec step length bilat, dec hip ext bilat, forward lean  TREATMENT DATE:  12/02/23: PT eval and HEP    PATIENT EDUCATION:  Education details: PT evaluation, objective findings, POC, Importance of HEP, Precautions, Clinic policies  Person educated: Patient Education method: Explanation and Demonstration Education comprehension: verbalized understanding and returned demonstration  HOME EXERCISE PROGRAM: Access Code: [email or web address] URL: [email or web address] Date: 12/02/2023 Prepared by: Virgia Griffins Powell-Butler  Exercises - Sit to Stand  - 2 x daily - 7 x weekly - 3 sets - 10 reps - Standing Hip Extension with Counter Support  - 2 x daily - 7 x weekly - 3 sets - 10 reps - Putty Squeezes  - 2 x daily - 7 x weekly - 2 sets - 10 reps - 10 hold - Standing Shoulder Row with Anchored Resistance  - 2 x daily - 7 x weekly - 2 sets - 10 reps - Shoulder External Rotation with Anchored Resistance  - 2 x daily - 7 x weekly - 2 sets - 10 reps - Shoulder Internal Rotation with Resistance  - 2 x daily - 7 x weekly - 2 sets - 10 reps  ASSESSMENT:  CLINICAL IMPRESSION: Patient is a 83 y.o. female who was seen today for physical therapy evaluation and treatment for "increase weakness in legs and hands." Patient arrives to PT evaluation with reports of a gradual decrease in LE strength and grip strength. Patient's daughter also present during session and contributes to subjective history taking. On this date, patient demonstrates decreased UE and LE strength, impaired grip strength (L>R), altered gait, decreased UE ROM, and dizziness with repetitive STS which are all affecting patient's function. Patient and daughter describe with fatigue, patient tends to increased forward trunk flexion and take shorter quicker steps which increase her fear of falling. Time spent explaining possible physiology behind this and strategies to combat this, such as intentional rest breaks. Patient will benefit from skilled physical therapy in order to address  the above to improve function and QOL.   OBJECTIVE IMPAIRMENTS: Abnormal gait, decreased activity tolerance, decreased balance, decreased endurance, decreased mobility, difficulty walking, decreased ROM, decreased strength, dizziness, impaired UE functional use, and postural dysfunction.   ACTIVITY LIMITATIONS: carrying, lifting, standing, transfers, dressing, and reach over head  PARTICIPATION LIMITATIONS: meal prep, cleaning, laundry, driving, community activity, and yard work  PERSONAL FACTORS: N/A are also affecting patient's functional outcome.   REHAB POTENTIAL: Good  CLINICAL DECISION MAKING: Stable/uncomplicated  EVALUATION COMPLEXITY: Low   GOALS: Goals reviewed with patient? No  SHORT TERM GOALS: Target date: 12/23/23 Patient will be independent with performance of HEP to demonstrate adequate self management of symptoms.  Baseline:  Goal status: INITIAL  2.   Patient will report at least a 25% improvement with function or pain overall since beginning PT. Baseline:  Goal status: INITIAL   LONG TERM GOALS: Target date: 01/20/24 Patient will improve LEFS score by 9 points to demonstrate improved perceived function while meeting [facility].  Baseline: Goal status: INITIAL Patient will improve QuickDASH score by 10 points to demonstrate improved perceived function while meeting [facility].  Baseline: Goal status: INITIAL 2.  Patient will improve TUG Score to 12 seconds or less to demonstrate decreased risk of falls.   Baseline:  Goal status: INITIAL 3.  Patient will improve L grip strength to at least 25 lb. To match R in order to improve functional tasks such as opening jars.  Baseline:  Goal status: INITIAL   4.  Patient will improve 30 second chair stand test to at least 8 STS to demonstrate  improved LE strength and endurance needed for community ambulation.  Baseline: Goal status: INITIAL   PLAN:  PT FREQUENCY: 2x/week  PT DURATION: 6 weeks  PLANNED INTERVENTIONS:  97164- PT Re-evaluation, 97110-Therapeutic exercises, 97530- Therapeutic activity, [id number]- Neuromuscular re-education, 97535- Self Care, 16109- Manual therapy, [id number]- Gait training, [phone]- Electrical stimulation (manual), [id number]- Traction (mechanical), [phone] (1-2 muscles), 20561 (3+ muscles)- Dry Needling, Patient/Family education, Balance training, Stair training, Taping, Joint mobilization, Spinal mobilization, DME instructions, Cryotherapy, and Moist heat  PLAN FOR NEXT SESSION: test, balance assess, progress UE and LE strengthening   10:30 AM, 12/02/23 Marysue Sola, PT, DPT [facility] [place]

## 2023-12-09 ENCOUNTER — Ambulatory Visit (HOSPITAL_COMMUNITY)

## 2023-12-16 ENCOUNTER — Encounter (HOSPITAL_COMMUNITY): Payer: Self-pay

## 2023-12-16 ENCOUNTER — Ambulatory Visit (HOSPITAL_COMMUNITY)

## 2023-12-16 DIAGNOSIS — M6281 Muscle weakness (generalized): Secondary | ICD-10-CM | POA: Diagnosis not present

## 2023-12-16 DIAGNOSIS — Z7409 Other reduced mobility: Secondary | ICD-10-CM | POA: Diagnosis not present

## 2023-12-16 NOTE — Therapy (Signed)
 OUTPATIENT PHYSICAL THERAPY LOWER EXTREMITY TREATMENT   Patient Name: Donna Daniels MRN: [id number] DOB:[birth year], 83 y.o., female Today's Date: 12/16/2023  END OF SESSION:  PT End of Session - 12/16/23 1431     Visit Number 2    Number of Visits 12    Date for PT Re-Evaluation 12/30/23    Authorization Type [employer] Dual complete    Authorization Time Period No auth    Progress Note Due on Visit 10    PT Start Time 1431    PT Stop Time 1510    PT Time Calculation (min) 39 min    Activity Tolerance Patient tolerated treatment well    Behavior During Therapy WFL for tasks assessed/performed          Past Medical History:  Diagnosis Date   Arthritis    Bruises easily    Cataracts, bilateral    High cholesterol    takes SImvastin  daily   History of bronchitis    last time about 20yrs ago   Hypothyroidism    takes Synthroid  daily   Joint pain    Joint swelling    PONV (postoperative nausea and vomiting)    Sciatica    Thyroid  disorder    Past Surgical History:  Procedure Laterality Date   ABDOMINAL HYSTERECTOMY     ANTERIOR CERVICAL DECOMP/DISCECTOMY FUSION  12/18/2011   Procedure: ANTERIOR CERVICAL DECOMPRESSION/DISCECTOMY FUSION 2 LEVELS;  Surgeon: Augustine Blocker, MD;  Location: [facility];  Service: Neurosurgery;  Laterality: N/A;  Anterior cervical decompression fusion four-six   CESAREAN SECTION     x 1    COLONOSCOPY     HAND SURGERY Left    THYROIDECTOMY     TOTAL KNEE ARTHROPLASTY Right 04/18/2013   Procedure: TOTAL KNEE ARTHROPLASTY;  Surgeon: Alphonzo Ask, MD;  Location: [facility];  Service: Orthopedics;  Laterality: Right;   TOTAL KNEE ARTHROPLASTY Left 07/24/2013   DR DALLDORF   TOTAL KNEE ARTHROPLASTY Left 07/25/2013   Procedure: TOTAL KNEE ARTHROPLASTY;  Surgeon: Alphonzo Ask, MD;  Location: [facility];  Service: Orthopedics;  Laterality: Left;   VESICOVAGINAL FISTULA CLOSURE W/ TAH     Patient Active Problem List   Diagnosis Date Noted    Abnormality of gait 08/17/2013   Stiffness of left  knee 08/17/2013   S/P total knee replacement 07/25/2013   Stiffness of joint, not elsewhere classified, lower leg 05/03/2013   Difficulty walking 05/03/2013   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  Has began the HEP without questions.    Eval:  Patient and daughter report new decreased LE and grip strength problems started this year. Struggles to get dressed (takes her more time), struggles to get up from chair after sitting for a while, and struggles opening jars. Report she would walk with family last year before she fell. Used a walking stick when walking. Now has a fear of falling. Reports no pain, just strength deficits.  PERTINENT HISTORY: 2 TKA L hand reconstruction, ~1984 C-Section Thyroidectomy Hysterectomy  PAIN:  Are you having pain? No  PRECAUTIONS: None  RED FLAGS: None   WEIGHT BEARING RESTRICTIONS: No  FALLS:  Has patient fallen in last 6 months? Yes. Number of falls 2, due to tripping over cat and dizziness (side effect from medication, no longer taking)   LIVING ENVIRONMENT: Lives with: lives alone Stairs: Yes: External: 3 steps; can reach both Has following equipment at home: None  OCCUPATION: Retired  PLOF: Independent  PATIENT GOALS: Want to improve strength in hands and arms, wants to get better with sit to stand  NEXT MD VISIT: August 2025  OBJECTIVE:  Note: Objective measures were completed at Evaluation unless  otherwise noted.  DIAGNOSTIC FINDINGS: N/A  PATIENT SURVEYS:  Lower Extremity Functional Score: 55 / 80 = 68.8 % QuickDASH Score: 36.4 / 100 = 36.4 %  COGNITION: Overall cognitive status: Within functional limits for tasks assessed     SENSATION: Light touch: Impaired  on L2 on R dermatome  POSTURE: rounded shoulders, forward head, and increased thoracic kyphosis    LOWER EXTREMITY MMT:  MMT Right eval Left eval  Hip flexion 4 4+  Hip extension 3+ 3+  Hip abduction 4 4  Hip adduction    Hip internal rotation    Hip external rotation    Knee flexion    Knee extension    Ankle dorsiflexion 5 5  Ankle plantarflexion    Ankle inversion    Ankle eversion     (Blank rows = not tested) UPPER EXTREMITY ROM:   Active ROM Right eval Left eval  Shoulder flexion 118 118  Shoulder extension    Shoulder abduction 120 120  Shoulder adduction    Shoulder internal rotation    Shoulder external rotation    Elbow flexion    Elbow extension    Wrist flexion    Wrist extension    Wrist ulnar deviation    Wrist radial deviation    Wrist pronation    Wrist supination     UPPER EXTREMITY MMT:  MMT Right eval Left eval  Shoulder flexion 4- 4-  Shoulder extension    Shoulder abduction 4- 4-  Shoulder adduction    Shoulder internal rotation 3+ 3+  Shoulder external rotation 3+ 3+  Middle trapezius    Lower trapezius    Elbow flexion    Elbow extension    Wrist flexion    Wrist extension    Wrist ulnar deviation    Wrist radial deviation    Wrist pronation    Wrist supination    Grip strength (lbs)    (Blank rows = not tested) (Blank rows = not tested)   FUNCTIONAL TESTS:  2 minute walk test: Next session Grip Strength: R: 25 lb  L: 20 lb 30 seconds chair stand: 4 STS, UE pushing from LE  12/16/23: 324ft decreased arm swing.  GAIT: Distance walked: 60 Assistive device utilized: None Level of assistance: Complete Independence Comments: Dec velocity,  dec step length bilat, dec hip ext bilat, forward lean  TREATMENT DATE:  12/16/23: Reviewed goals 321 10 STS eccentric control Heel raises 10x 2 sets Toe raises 10x 2 sets Hip abduction 10x 2 sets Hip extension  DGI 1. Gait level surface (1) Moderate Impairment: Walks 20', slow speed, abnormal gait pattern, evidence for imbalance. 2. Change in gait speed (0) Severe Impairment: Cannot change speeds, or loses balance and has to reach for wall or be caught. 3. Gait with horizontal head turns (1) Moderate Impairment: Performs head turns with moderate change in gait velocity, slows down, staggers but recovers, can continue to walk. 4. Gait with vertical head turns 1) Moderate Impairment: Performs head turns with moderate change in gait velocity, slows down, staggers but recovers, can continue to walk. 5. Gait and pivot turn (2) Mild Impairment: Pivot turns safely in > 3 seconds and stops with no loss of balance. 6. Step over obstacle (1) Moderate Impairment: Is able to step over box but must stop, then step over. May require verbal cueing. 7. Step around obstacles (1) Moderate Impairment: Is able to clear cones but must significantly slow, speed to accomplish task, or requires verbal cueing. 8. Stairs (2) Mild Impairment: Alternating feet, must use rail.  TOTAL SCORE: 9 / 24  Gait training with PVC pipes to improve arm swing with gait x 223ft   12/02/23: PT eval and HEP    PATIENT EDUCATION:  Education details: PT evaluation, objective findings, POC, Importance of HEP, Precautions, Clinic policies  Person educated: Patient Education method: Explanation and Demonstration Education comprehension: verbalized understanding and returned demonstration  HOME EXERCISE PROGRAM: Access Code: [email or web address] URL: [email or web address] Date:  12/02/2023 Prepared by: Virgia Griffins Powell-Butler  Exercises - Sit to Stand  - 2 x daily - 7 x weekly - 3 sets - 10 reps - Standing Hip Extension with Counter Support  - 2 x daily - 7 x weekly - 3 sets - 10 reps - Putty Squeezes  - 2 x daily - 7 x weekly - 2 sets - 10 reps - 10 hold - Standing Shoulder Row with Anchored Resistance  - 2 x daily - 7 x weekly - 2 sets - 10 reps - Shoulder External Rotation with Anchored Resistance  - 2 x daily - 7 x weekly - 2 sets - 10 reps - Shoulder Internal Rotation with Resistance  - 2 x daily - 7 x weekly - 2 sets - 10 reps  12/16/23: - Heel Toe Raises with Counter Support  - 2 x daily - 7 x weekly - 2 sets - 10 reps  ASSESSMENT:  CLINICAL IMPRESSION: 12/16/23:  Reviewed goals and educated importance of HEP compliance for maximal benefits.  Began session with , pt ambulates with minimal arm swing and decrease dorsiflexion clearance.  Reviewed form and mechanics with some cueing to reduce abduction with IR/ER theraband exercises, encouraged to add towel by elbow to improve mechanics.  DGI complete with score of 9/24 indicating fall risk.  Pt educated on proper arm swing with gait to improve gait mechanics.  Added heel and toe raises to HEP to improve ankle stability with gait.  No reports of pain, was limited by fatigue through sessions.  Reports she had been shopping with daughter earlier today.    Eval:  Patient is a 83 y.o. female who was seen today for physical therapy evaluation and treatment for increase weakness in legs and hands. Patient arrives to PT evaluation with reports of a gradual decrease in LE strength and grip strength. Patient's daughter also present during  session and contributes to subjective history taking. On this date, patient demonstrates decreased UE and LE strength, impaired grip strength (L>R), altered gait, decreased UE ROM, and dizziness with repetitive STS which are all affecting patient's function. Patient and daughter describe with  fatigue, patient tends to increased forward trunk flexion and take shorter quicker steps which increase her fear of falling. Time spent explaining possible physiology behind this and strategies to combat this, such as intentional rest breaks. Patient will benefit from skilled physical therapy in order to address the above to improve function and QOL.   OBJECTIVE IMPAIRMENTS: Abnormal gait, decreased activity tolerance, decreased balance, decreased endurance, decreased mobility, difficulty walking, decreased ROM, decreased strength, dizziness, impaired UE functional use, and postural dysfunction.   ACTIVITY LIMITATIONS: carrying, lifting, standing, transfers, dressing, and reach over head  PARTICIPATION LIMITATIONS: meal prep, cleaning, laundry, driving, community activity, and yard work  PERSONAL FACTORS: N/A are also affecting patient's functional outcome.   REHAB POTENTIAL: Good  CLINICAL DECISION MAKING: Stable/uncomplicated  EVALUATION COMPLEXITY: Low   GOALS: Goals reviewed with patient? No  SHORT TERM GOALS: Target date: 12/23/23 Patient will be independent with performance of HEP to demonstrate adequate self management of symptoms.  Baseline:  Goal status: INITIAL  2.   Patient will report at least a 25% improvement with function or pain overall since beginning PT. Baseline:  Goal status: INITIAL   LONG TERM GOALS: Target date: 01/20/24 Patient will improve LEFS score by 9 points to demonstrate improved perceived function while meeting [facility].  Baseline: Goal status: INITIAL Patient will improve QuickDASH score by 10 points to demonstrate improved perceived function while meeting [facility].  Baseline: Goal status: INITIAL 2.  Patient will improve TUG Score to 12 seconds or less to demonstrate decreased risk of falls.   Baseline:  Goal status: INITIAL 3.  Patient will improve L grip strength to at least 25 lb. To match R in order to improve functional tasks such as opening jars.   Baseline:  Goal status: INITIAL   4.  Patient will improve 30 second chair stand test to at least 8 STS to demonstrate improved LE strength and endurance needed for community ambulation.  Baseline: Goal status: INITIAL   PLAN:  PT FREQUENCY: 2x/week  PT DURATION: 6 weeks  PLANNED INTERVENTIONS: 97164- PT Re-evaluation, 97110-Therapeutic exercises, 97530- Therapeutic activity, [id number]- Neuromuscular re-education, 97535- Self Care, 40102- Manual therapy, [id number]- Gait training, [phone]- Electrical stimulation (manual), [id number]- Traction (mechanical), [phone] (1-2 muscles), [id number] (3+ muscles)- Dry Needling, Patient/Family education, Balance training, Stair training, Taping, Joint mobilization, Spinal mobilization, DME instructions, Cryotherapy, and Moist heat  PLAN FOR NEXT SESSION:  Progress UE and LE strengthening.  Add balance activities.  Minor Amble, LPTA/CLT; Johnye Napoleon [phone]  4:22 PM, 12/16/23

## 2023-12-22 ENCOUNTER — Ambulatory Visit (HOSPITAL_COMMUNITY)

## 2023-12-22 ENCOUNTER — Encounter (HOSPITAL_COMMUNITY): Payer: Self-pay

## 2023-12-22 DIAGNOSIS — M6281 Muscle weakness (generalized): Secondary | ICD-10-CM

## 2023-12-22 DIAGNOSIS — Z7409 Other reduced mobility: Secondary | ICD-10-CM

## 2023-12-22 NOTE — Therapy (Signed)
 OUTPATIENT PHYSICAL THERAPY LOWER EXTREMITY TREATMENT   Patient Name: Donna Daniels MRN: [id number] DOB:Aug 04, 1940, 83 y.o., female Today's Date: 12/22/2023  END OF SESSION:  PT End of Session - 12/22/23 1309     Visit Number 3    Number of Visits 12    Date for PT Re-Evaluation 12/30/23    Authorization Type [employer] Dual complete    Authorization Time Period No auth    Progress Note Due on Visit 10    PT Start Time 1309   late signin   PT Stop Time 1343    PT Time Calculation (min) 34 min    Equipment Utilized During Treatment Gait belt    Activity Tolerance Patient tolerated treatment well    Behavior During Therapy WFL for tasks assessed/performed          Past Medical History:  Diagnosis Date   Arthritis    Bruises easily    Cataracts, bilateral    High cholesterol    takes SImvastin  daily   History of bronchitis    last time about 58yrs ago   Hypothyroidism    takes Synthroid  daily   Joint pain    Joint swelling    PONV (postoperative nausea and vomiting)    Sciatica    Thyroid  disorder    Past Surgical History:  Procedure Laterality Date   ABDOMINAL HYSTERECTOMY     ANTERIOR CERVICAL DECOMP/DISCECTOMY FUSION  12/18/2011   Procedure: ANTERIOR CERVICAL DECOMPRESSION/DISCECTOMY FUSION 2 LEVELS;  Surgeon: Darina MALVA Boehringer, MD;  Location: [facility];  Service: Neurosurgery;  Laterality: N/A;  Anterior cervical decompression fusion four-six   CESAREAN SECTION     x 1    COLONOSCOPY     HAND SURGERY Left    THYROIDECTOMY     TOTAL KNEE ARTHROPLASTY Right 04/18/2013   Procedure: TOTAL KNEE ARTHROPLASTY;  Surgeon: Maude KANDICE Herald, MD;  Location: [facility];  Service: Orthopedics;  Laterality: Right;   TOTAL KNEE ARTHROPLASTY Left 07/24/2013   DR DALLDORF   TOTAL KNEE ARTHROPLASTY Left 07/25/2013   Procedure: TOTAL KNEE ARTHROPLASTY;  Surgeon: Maude KANDICE Herald, MD;  Location: [facility];  Service: Orthopedics;  Laterality: Left;   VESICOVAGINAL FISTULA CLOSURE W/ TAH      Patient Active Problem List   Diagnosis Date Noted   Abnormality of gait 08/17/2013   Stiffness of left  knee 08/17/2013   S/P total knee replacement 07/25/2013   Stiffness of joint, not elsewhere classified, lower leg 05/03/2013   Difficulty walking 05/03/2013   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  Has been trying to swing her arms more while walking, stated her daughter gets onto her about it.  Exercises going well at home.  Eval:  Patient and daughter report new decreased LE and grip strength problems started this year. Struggles to get dressed (takes her more time), struggles to get up from chair after sitting for a while, and struggles opening jars. Report she would walk with family last year before she fell. Used a walking stick  when walking. Now has a fear of falling. Reports no pain, just strength deficits.  PERTINENT HISTORY: 2 TKA L hand reconstruction, ~8015 C-Section Thyroidectomy Hysterectomy  PAIN:  Are you having pain? No  PRECAUTIONS: None  RED FLAGS: None   WEIGHT BEARING RESTRICTIONS: No  FALLS:  Has patient fallen in last 6 months? Yes. Number of falls 2, due to tripping over cat and dizziness (side effect from medication, no longer taking)   LIVING ENVIRONMENT: Lives with: lives alone Stairs: Yes: External: 3 steps; can reach both Has following equipment at home: None  OCCUPATION: Retired  PLOF: Independent  PATIENT GOALS: Want to improve strength in hands and arms,  wants to get better with sit to stand  NEXT MD VISIT: August 2025  OBJECTIVE:  Note: Objective measures were completed at Evaluation unless otherwise noted.  DIAGNOSTIC FINDINGS: N/A  PATIENT SURVEYS:  Lower Extremity Functional Score: 55 / 80 = 68.8 % QuickDASH Score: 36.4 / 100 = 36.4 %  COGNITION: Overall cognitive status: Within functional limits for tasks assessed     SENSATION: Light touch: Impaired  on L2 on R dermatome  POSTURE: rounded shoulders, forward head, and increased thoracic kyphosis    LOWER EXTREMITY MMT:  MMT Right eval Left eval  Hip flexion 4 4+  Hip extension 3+ 3+  Hip abduction 4 4  Hip adduction    Hip internal rotation    Hip external rotation    Knee flexion    Knee extension    Ankle dorsiflexion 5 5  Ankle plantarflexion    Ankle inversion    Ankle eversion     (Blank rows = not tested) UPPER EXTREMITY ROM:   Active ROM Right eval Left eval  Shoulder flexion 118 118  Shoulder extension    Shoulder abduction 120 120  Shoulder adduction    Shoulder internal rotation    Shoulder external rotation    Elbow flexion    Elbow extension    Wrist flexion    Wrist extension    Wrist ulnar deviation    Wrist radial deviation    Wrist pronation    Wrist supination     UPPER EXTREMITY MMT:  MMT Right eval Left eval  Shoulder flexion 4- 4-  Shoulder extension    Shoulder abduction 4- 4-  Shoulder adduction    Shoulder internal rotation 3+ 3+  Shoulder external rotation 3+ 3+  Middle trapezius    Lower trapezius    Elbow flexion    Elbow extension    Wrist flexion    Wrist extension    Wrist ulnar deviation    Wrist radial deviation    Wrist pronation    Wrist supination    Grip strength (lbs)    (Blank rows = not tested) (Blank rows = not tested)   FUNCTIONAL TESTS:  2 minute walk test: Next session Grip Strength: R: 25 lb  L: 20 lb 30 seconds chair stand: 4 STS, UE pushing from LE  12/16/23: 331ft  decreased arm swing.  GAIT: Distance walked: 60 Assistive device utilized: None Level of assistance: Complete Independence Comments: Dec velocity, dec step length bilat, dec hip ext bilat, forward lean  TREATMENT DATE:  12/22/23: Standing: Heel raises 15x Toe raises 15x Tap tapping 6in 3 sets 10 reps (last set trial with opposite UE/LE) 15 STS no HHA Hip abduction with RTB around thigh 2 sets 10 reps (Cueing to reduce ER) Hip extension with RTB around thigh 2x 10 Sidestep down black line (~55ft) 2RT with RTB around thigh 2RT Partial tandem stance 3x 30 (1st set static, 2nd set with UE flexion 10x each side, 3rd set with head turns 5x each) Squat 10x 2 front of chair   12/16/23: Reviewed goals 321 10 STS eccentric control Heel raises 10x 2 sets Toe raises 10x 2 sets Hip abduction 10x 2 sets Hip extension  DGI 1. Gait level surface (1) Moderate Impairment: Walks 20', slow speed, abnormal gait pattern, evidence for imbalance. 2. Change in gait speed (0) Severe Impairment: Cannot change speeds, or loses balance and has to reach for wall or be caught. 3. Gait with horizontal head turns (1) Moderate Impairment: Performs head turns with moderate change in gait velocity, slows down, staggers but recovers, can continue to walk. 4. Gait with vertical head turns 1) Moderate Impairment: Performs head turns with moderate change in gait velocity, slows down, staggers but recovers, can continue to walk. 5. Gait and pivot turn (2) Mild Impairment: Pivot turns safely in > 3 seconds and stops with no loss of balance. 6. Step over obstacle (1) Moderate Impairment: Is able to step over box but must stop, then step over. May require verbal cueing. 7. Step around obstacles (1) Moderate Impairment: Is able to clear cones but must significantly slow, speed to  accomplish task, or requires verbal cueing. 8. Stairs (2) Mild Impairment: Alternating feet, must use rail.  TOTAL SCORE: 9 / 24  Gait training with PVC pipes to improve arm swing with gait x 272ft   12/02/23: PT eval and HEP    PATIENT EDUCATION:  Education details: PT evaluation, objective findings, POC, Importance of HEP, Precautions, Clinic policies  Person educated: Patient Education method: Explanation and Demonstration Education comprehension: verbalized understanding and returned demonstration  HOME EXERCISE PROGRAM: Access Code: [email or web address] URL: [email or web address] Date: 12/02/2023 Prepared by: Rosaria Powell-Butler  Exercises - Sit to Stand  - 2 x daily - 7 x weekly - 3 sets - 10 reps - Standing Hip Extension with Counter Support  - 2 x daily - 7 x weekly - 3 sets - 10 reps - Putty Squeezes  - 2 x daily - 7 x weekly - 2 sets - 10 reps - 10 hold - Standing Shoulder Row with Anchored Resistance  - 2 x daily - 7 x weekly - 2 sets - 10 reps - Shoulder External Rotation with Anchored Resistance  - 2 x daily - 7 x weekly - 2 sets - 10 reps - Shoulder Internal Rotation with Resistance  - 2 x daily - 7 x weekly - 2 sets - 10 reps  12/16/23: - Heel Toe Raises with Counter Support  - 2 x daily - 7 x weekly - 2 sets - 10 reps  ASSESSMENT:  CLINICAL IMPRESSION: 12/22/23:  Session focus with hip strengthening and balance training.  Added theraband resistance for hip strengthening and some new balance activities with min A for safety.  Included UE movements with balance activities to improve sequence and coordination with some difficulty with opposite extremities as tendency to pair same arm and same leg.  Pt given red theraband to add to standing hip strengthening exercises.  No reports of pain  through session, was limited by appropriate fatigue levels.   Eval:  Patient is a 83 y.o. female who was seen today for physical therapy evaluation and treatment for increase  weakness in legs and hands. Patient arrives to PT evaluation with reports of a gradual decrease in LE strength and grip strength. Patient's daughter also present during session and contributes to subjective history taking. On this date, patient demonstrates decreased UE and LE strength, impaired grip strength (L>R), altered gait, decreased UE ROM, and dizziness with repetitive STS which are all affecting patient's function. Patient and daughter describe with fatigue, patient tends to increased forward trunk flexion and take shorter quicker steps which increase her fear of falling. Time spent explaining possible physiology behind this and strategies to combat this, such as intentional rest breaks. Patient will benefit from skilled physical therapy in order to address the above to improve function and QOL.   OBJECTIVE IMPAIRMENTS: Abnormal gait, decreased activity tolerance, decreased balance, decreased endurance, decreased mobility, difficulty walking, decreased ROM, decreased strength, dizziness, impaired UE functional use, and postural dysfunction.   ACTIVITY LIMITATIONS: carrying, lifting, standing, transfers, dressing, and reach over head  PARTICIPATION LIMITATIONS: meal prep, cleaning, laundry, driving, community activity, and yard work  PERSONAL FACTORS: N/A are also affecting patient's functional outcome.   REHAB POTENTIAL: Good  CLINICAL DECISION MAKING: Stable/uncomplicated  EVALUATION COMPLEXITY: Low   GOALS: Goals reviewed with patient? No  SHORT TERM GOALS: Target date: 12/23/23 Patient will be independent with performance of HEP to demonstrate adequate self management of symptoms.  Baseline:  Goal status: INITIAL  2.   Patient will report at least a 25% improvement with function or pain overall since beginning PT. Baseline:  Goal status: INITIAL   LONG TERM GOALS: Target date: 01/20/24 Patient will improve LEFS score by 9 points to demonstrate improved perceived function  while meeting [facility].  Baseline: Goal status: INITIAL Patient will improve QuickDASH score by 10 points to demonstrate improved perceived function while meeting [facility].  Baseline: Goal status: INITIAL 2.  Patient will improve TUG Score to 12 seconds or less to demonstrate decreased risk of falls.   Baseline:  Goal status: INITIAL 3.  Patient will improve L grip strength to at least 25 lb. To match R in order to improve functional tasks such as opening jars.  Baseline:  Goal status: INITIAL   4.  Patient will improve 30 second chair stand test to at least 8 STS to demonstrate improved LE strength and endurance needed for community ambulation.  Baseline: Goal status: INITIAL   PLAN:  PT FREQUENCY: 2x/week  PT DURATION: 6 weeks  PLANNED INTERVENTIONS: 97164- PT Re-evaluation, 97110-Therapeutic exercises, 97530- Therapeutic activity, [id number]- Neuromuscular re-education, 97535- Self Care, [id number]- Manual therapy, [id number]- Gait training, [phone]- Electrical stimulation (manual), [id number]- Traction (mechanical), [id number] (1-2 muscles), [id number] (3+ muscles)- Dry Needling, Patient/Family education, Balance training, Stair training, Taping, Joint mobilization, Spinal mobilization, DME instructions, Cryotherapy, and Moist heat  PLAN FOR NEXT SESSION:  Progress UE and LE strengthening.  Add balance activities.  Augustin Mclean, LPTA/CLT; CBIS [phone]  1:53 PM, 12/22/23

## 2023-12-23 ENCOUNTER — Encounter (HOSPITAL_COMMUNITY): Payer: Self-pay

## 2023-12-23 ENCOUNTER — Ambulatory Visit (HOSPITAL_COMMUNITY)

## 2023-12-23 DIAGNOSIS — Z7409 Other reduced mobility: Secondary | ICD-10-CM | POA: Diagnosis not present

## 2023-12-23 DIAGNOSIS — M6281 Muscle weakness (generalized): Secondary | ICD-10-CM

## 2023-12-23 NOTE — Therapy (Signed)
 OUTPATIENT PHYSICAL THERAPY LOWER EXTREMITY TREATMENT   Patient Name: Donna Daniels MRN: [id number] DOB:[birth year], 83 y.o., female Today's Date: 12/23/2023  END OF SESSION:  PT End of Session - 12/23/23 1353     Visit Number 4    Number of Visits 12    Date for PT Re-Evaluation 12/30/23    Authorization Type [employer] Dual complete    Authorization Time Period No auth    Progress Note Due on Visit 10    PT Start Time 1352    PT Stop Time 1432    PT Time Calculation (min) 40 min    Equipment Utilized During Treatment Gait belt    Activity Tolerance Patient tolerated treatment well    Behavior During Therapy WFL for tasks assessed/performed          Past Medical History:  Diagnosis Date   Arthritis    Bruises easily    Cataracts, bilateral    High cholesterol    takes SImvastin  daily   History of bronchitis    last time about 56yrs ago   Hypothyroidism    takes Synthroid  daily   Joint pain    Joint swelling    PONV (postoperative nausea and vomiting)    Sciatica    Thyroid  disorder    Past Surgical History:  Procedure Laterality Date   ABDOMINAL HYSTERECTOMY     ANTERIOR CERVICAL DECOMP/DISCECTOMY FUSION  12/18/2011   Procedure: ANTERIOR CERVICAL DECOMPRESSION/DISCECTOMY FUSION 2 LEVELS;  Surgeon: Darina MALVA Boehringer, MD;  Location: [facility];  Service: Neurosurgery;  Laterality: N/A;  Anterior cervical decompression fusion four-six   CESAREAN SECTION     x 1    COLONOSCOPY     HAND SURGERY Left    THYROIDECTOMY     TOTAL KNEE ARTHROPLASTY Right 04/18/2013   Procedure: TOTAL KNEE ARTHROPLASTY;  Surgeon: Maude KANDICE Herald, MD;  Location: [facility];  Service: Orthopedics;  Laterality: Right;   TOTAL KNEE ARTHROPLASTY Left 07/24/2013   DR DALLDORF   TOTAL KNEE ARTHROPLASTY Left 07/25/2013   Procedure: TOTAL KNEE ARTHROPLASTY;  Surgeon: Maude KANDICE Herald, MD;  Location: [facility];  Service: Orthopedics;  Laterality: Left;   VESICOVAGINAL FISTULA CLOSURE W/ TAH     Patient  Active Problem List   Diagnosis Date Noted   Abnormality of gait 08/17/2013   Stiffness of left  knee 08/17/2013   S/P total knee replacement 07/25/2013   Stiffness of joint, not elsewhere classified, lower leg 05/03/2013   Difficulty walking 05/03/2013   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  Grandson present this session who stated they have been talking to her about moving arms while walking.  Eval:  Patient and daughter report new decreased LE and grip strength problems started this year. Struggles to get dressed (takes her more time), struggles to get up from chair after sitting for a while, and struggles opening jars. Report she would walk with family last year before she fell. Used a walking stick when walking. Now has a fear of falling. Reports no pain, just strength  deficits.  PERTINENT HISTORY: 2 TKA L hand reconstruction, ~8015 C-Section Thyroidectomy Hysterectomy  PAIN:  Are you having pain? No  PRECAUTIONS: None  RED FLAGS: None   WEIGHT BEARING RESTRICTIONS: No  FALLS:  Has patient fallen in last 6 months? Yes. Number of falls 2, due to tripping over cat and dizziness (side effect from medication, no longer taking)   LIVING ENVIRONMENT: Lives with: lives alone Stairs: Yes: External: 3 steps; can reach both Has following equipment at home: None  OCCUPATION: Retired  PLOF: Independent  PATIENT GOALS: Want to improve strength in hands and arms, wants to get better with sit to stand  NEXT MD VISIT:  August 2025  OBJECTIVE:  Note: Objective measures were completed at Evaluation unless otherwise noted.  DIAGNOSTIC FINDINGS: N/A  PATIENT SURVEYS:  Lower Extremity Functional Score: 55 / 80 = 68.8 % QuickDASH Score: 36.4 / 100 = 36.4 %  COGNITION: Overall cognitive status: Within functional limits for tasks assessed     SENSATION: Light touch: Impaired  on L2 on R dermatome  POSTURE: rounded shoulders, forward head, and increased thoracic kyphosis    LOWER EXTREMITY MMT:  MMT Right eval Left eval  Hip flexion 4 4+  Hip extension 3+ 3+  Hip abduction 4 4  Hip adduction    Hip internal rotation    Hip external rotation    Knee flexion    Knee extension    Ankle dorsiflexion 5 5  Ankle plantarflexion    Ankle inversion    Ankle eversion     (Blank rows = not tested) UPPER EXTREMITY ROM:   Active ROM Right eval Left eval  Shoulder flexion 118 118  Shoulder extension    Shoulder abduction 120 120  Shoulder adduction    Shoulder internal rotation    Shoulder external rotation    Elbow flexion    Elbow extension    Wrist flexion    Wrist extension    Wrist ulnar deviation    Wrist radial deviation    Wrist pronation    Wrist supination     UPPER EXTREMITY MMT:  MMT Right eval Left eval  Shoulder flexion 4- 4-  Shoulder extension    Shoulder abduction 4- 4-  Shoulder adduction    Shoulder internal rotation 3+ 3+  Shoulder external rotation 3+ 3+  Middle trapezius    Lower trapezius    Elbow flexion    Elbow extension    Wrist flexion    Wrist extension    Wrist ulnar deviation    Wrist radial deviation    Wrist pronation    Wrist supination    Grip strength (lbs)    (Blank rows = not tested) (Blank rows = not tested)   FUNCTIONAL TESTS:  2 minute walk test: Next session Grip Strength: R: 25 lb  L: 20 lb 30 seconds chair stand: 4 STS, UE pushing from LE  12/16/23: 36ft decreased arm swing.  GAIT: Distance walked: 60 Assistive  device utilized: None Level of assistance: Complete Independence Comments: Dec velocity, dec step length bilat, dec hip ext bilat, forward lean  TREATMENT DATE:  12/23/23: Standing:  - Toe tap with opposite UE flexed  - Forward step up 6in step height 10 with 1 HHA  - Rebounder with small green weight ball with STS  - Marching 1RT down hallway  - Arm swing down hallway 70 ft  - Tandem stance on foam 3x 30  - NBOS on foam UE flexion 20x  - Squat 10x 2 front of chair  - Sidestep down black line (~86ft) 2RT with RTB around thigh 2RT  - Vector stance 3x 5 [place] UE/LE L2 with average SPM 79 x 5'   12/22/23: Standing: Heel raises 15x Toe raises 15x Tap tapping 6in 3 sets 10 reps (last set trial with opposite UE/LE) 15 STS no HHA Hip abduction with RTB around thigh 2 sets 10 reps (Cueing to reduce ER) Hip extension with RTB around thigh 2x 10 Sidestep down black line (~19ft) 2RT with RTB around thigh 2RT Partial tandem stance 3x 30 (1st set static, 2nd set with UE flexion 10x each side, 3rd set with head turns 5x each) Squat 10x 2 front of chair   12/16/23: Reviewed goals 321 10 STS eccentric control Heel raises 10x 2 sets Toe raises 10x 2 sets Hip abduction 10x 2 sets Hip extension  DGI 1. Gait level surface (1) Moderate Impairment: Walks 20', slow speed, abnormal gait pattern, evidence for imbalance. 2. Change in gait speed (0) Severe Impairment: Cannot change speeds, or loses balance and has to reach for wall or be caught. 3. Gait with horizontal head turns (1) Moderate Impairment: Performs head turns with moderate change in gait velocity, slows down, staggers but recovers, can continue to walk. 4. Gait with vertical head turns 1) Moderate Impairment: Performs head turns with moderate change in gait velocity, slows down,  staggers but recovers, can continue to walk. 5. Gait and pivot turn (2) Mild Impairment: Pivot turns safely in > 3 seconds and stops with no loss of balance. 6. Step over obstacle (1) Moderate Impairment: Is able to step over box but must stop, then step over. May require verbal cueing. 7. Step around obstacles (1) Moderate Impairment: Is able to clear cones but must significantly slow, speed to accomplish task, or requires verbal cueing. 8. Stairs (2) Mild Impairment: Alternating feet, must use rail.  TOTAL SCORE: 9 / 24  Gait training with PVC pipes to improve arm swing with gait x 268ft   12/02/23: PT eval and HEP    PATIENT EDUCATION:  Education details: PT evaluation, objective findings, POC, Importance of HEP, Precautions, Clinic policies  Person educated: Patient Education method: Explanation and Demonstration Education comprehension: verbalized understanding and returned demonstration  HOME EXERCISE PROGRAM: Access Code: [email or web address] URL: [email or web address] Date: 12/02/2023 Prepared by: Rosaria Powell-Butler  Exercises - Sit to Stand  - 2 x daily - 7 x weekly - 3 sets - 10 reps - Standing Hip Extension with Counter Support  - 2 x daily - 7 x weekly - 3 sets - 10 reps - Putty Squeezes  - 2 x daily - 7 x weekly - 2 sets - 10 reps - 10 hold - Standing Shoulder Row with Anchored Resistance  - 2 x daily - 7 x weekly - 2 sets - 10 reps - Shoulder External Rotation with Anchored Resistance  - 2 x daily - 7 x weekly - 2 sets - 10 reps - Shoulder Internal Rotation with Resistance  - 2 x daily - 7 x weekly - 2 sets -  10 reps  12/16/23: - Heel Toe Raises with Counter Support  - 2 x daily - 7 x weekly - 2 sets - 10 reps  ASSESSMENT:  CLINICAL IMPRESSION: 12/23/23: Pt continues to ambulate without UE swing.  Session focus with strengthening and balance activities with increased focus on pairing UE and opposite LE.  Progressed to dynamic surface static balance activities  that required intermittent HHA and cueing for posture due to tendency to look at floor.  Pt tolerated well to session with no reports of increased pain and appropriate fatigue levels.     Eval:  Patient is a 83 y.o. female who was seen today for physical therapy evaluation and treatment for increase weakness in legs and hands. Patient arrives to PT evaluation with reports of a gradual decrease in LE strength and grip strength. Patient's daughter also present during session and contributes to subjective history taking. On this date, patient demonstrates decreased UE and LE strength, impaired grip strength (L>R), altered gait, decreased UE ROM, and dizziness with repetitive STS which are all affecting patient's function. Patient and daughter describe with fatigue, patient tends to increased forward trunk flexion and take shorter quicker steps which increase her fear of falling. Time spent explaining possible physiology behind this and strategies to combat this, such as intentional rest breaks. Patient will benefit from skilled physical therapy in order to address the above to improve function and QOL.   OBJECTIVE IMPAIRMENTS: Abnormal gait, decreased activity tolerance, decreased balance, decreased endurance, decreased mobility, difficulty walking, decreased ROM, decreased strength, dizziness, impaired UE functional use, and postural dysfunction.   ACTIVITY LIMITATIONS: carrying, lifting, standing, transfers, dressing, and reach over head  PARTICIPATION LIMITATIONS: meal prep, cleaning, laundry, driving, community activity, and yard work  PERSONAL FACTORS: N/A are also affecting patient's functional outcome.   REHAB POTENTIAL: Good  CLINICAL DECISION MAKING: Stable/uncomplicated  EVALUATION COMPLEXITY: Low   GOALS: Goals reviewed with patient? No  SHORT TERM GOALS: Target date: 12/23/23 Patient will be independent with performance of HEP to demonstrate adequate self management of symptoms.   Baseline:  Goal status: INITIAL  2.   Patient will report at least a 25% improvement with function or pain overall since beginning PT. Baseline:  Goal status: INITIAL   LONG TERM GOALS: Target date: 01/20/24 Patient will improve LEFS score by 9 points to demonstrate improved perceived function while meeting [facility].  Baseline: Goal status: INITIAL Patient will improve QuickDASH score by 10 points to demonstrate improved perceived function while meeting [facility].  Baseline: Goal status: INITIAL 2.  Patient will improve TUG Score to 12 seconds or less to demonstrate decreased risk of falls.   Baseline:  Goal status: INITIAL 3.  Patient will improve L grip strength to at least 25 lb. To match R in order to improve functional tasks such as opening jars.  Baseline:  Goal status: INITIAL   4.  Patient will improve 30 second chair stand test to at least 8 STS to demonstrate improved LE strength and endurance needed for community ambulation.  Baseline: Goal status: INITIAL   PLAN:  PT FREQUENCY: 2x/week  PT DURATION: 6 weeks  PLANNED INTERVENTIONS: 97164- PT Re-evaluation, 97110-Therapeutic exercises, 97530- Therapeutic activity, [id number]- Neuromuscular re-education, 97535- Self Care, [id number]- Manual therapy, [id number]- Gait training, [phone]- Electrical stimulation (manual), [id number]- Traction (mechanical), [phone] (1-2 muscles), [id number] (3+ muscles)- Dry Needling, Patient/Family education, Balance training, Stair training, Taping, Joint mobilization, Spinal mobilization, DME instructions, Cryotherapy, and Moist heat  PLAN FOR NEXT SESSION:  Progress UE and  LE strengthening.  Gait training to improve arm swing.  Progress balance as able.    Augustin Mclean, LPTA/CLT; [facility] [phone]  2:48 PM, 12/23/23

## 2023-12-28 ENCOUNTER — Encounter (HOSPITAL_COMMUNITY): Payer: Self-pay

## 2023-12-28 ENCOUNTER — Ambulatory Visit (HOSPITAL_COMMUNITY): Attending: Family Medicine

## 2023-12-28 DIAGNOSIS — Z7409 Other reduced mobility: Secondary | ICD-10-CM | POA: Insufficient documentation

## 2023-12-28 DIAGNOSIS — M6281 Muscle weakness (generalized): Secondary | ICD-10-CM | POA: Diagnosis not present

## 2023-12-28 NOTE — Therapy (Signed)
 OUTPATIENT PHYSICAL THERAPY LOWER EXTREMITY TREATMENT   Patient Name: Donna Daniels MRN: [id number] DOB:04/16/1941, 83 y.o., female Today's Date: 12/28/2023  END OF SESSION:  PT End of Session - 12/28/23 0956     Visit Number 5    Number of Visits 12    Date for PT Re-Evaluation 12/30/23    Authorization Type [employer] Dual complete    Authorization Time Period No auth    Progress Note Due on Visit 10    PT Start Time [phone]    PT Stop Time 1013    PT Time Calculation (min) 36 min    Equipment Utilized During Treatment Gait belt    Activity Tolerance Patient tolerated treatment well    Behavior During Therapy WFL for tasks assessed/performed           Past Medical History:  Diagnosis Date   Arthritis    Bruises easily    Cataracts, bilateral    High cholesterol    takes SImvastin  daily   History of bronchitis    last time about 67yrs ago   Hypothyroidism    takes Synthroid  daily   Joint pain    Joint swelling    PONV (postoperative nausea and vomiting)    Sciatica    Thyroid  disorder    Past Surgical History:  Procedure Laterality Date   ABDOMINAL HYSTERECTOMY     ANTERIOR CERVICAL DECOMP/DISCECTOMY FUSION  12/18/2011   Procedure: ANTERIOR CERVICAL DECOMPRESSION/DISCECTOMY FUSION 2 LEVELS;  Surgeon: Darina MALVA Boehringer, MD;  Location: [facility];  Service: Neurosurgery;  Laterality: N/A;  Anterior cervical decompression fusion four-six   CESAREAN SECTION     x 1    COLONOSCOPY     HAND SURGERY Left    THYROIDECTOMY     TOTAL KNEE ARTHROPLASTY Right 04/18/2013   Procedure: TOTAL KNEE ARTHROPLASTY;  Surgeon: Maude KANDICE Herald, MD;  Location: [facility];  Service: Orthopedics;  Laterality: Right;   TOTAL KNEE ARTHROPLASTY Left 07/24/2013   DR DALLDORF   TOTAL KNEE ARTHROPLASTY Left 07/25/2013   Procedure: TOTAL KNEE ARTHROPLASTY;  Surgeon: Maude KANDICE Herald, MD;  Location: [facility];  Service: Orthopedics;  Laterality: Left;   VESICOVAGINAL FISTULA CLOSURE W/ TAH     Patient  Active Problem List   Diagnosis Date Noted   Abnormality of gait 08/17/2013   Stiffness of left  knee 08/17/2013   S/P total knee replacement 07/25/2013   Stiffness of joint, not elsewhere classified, lower leg 05/03/2013   Difficulty walking 05/03/2013   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  Eval:  Patient and daughter report new decreased LE and grip strength problems started this year. Struggles to get dressed (takes her more time), struggles to get up from chair after sitting for a while, and struggles opening jars. Report she would walk with family last year before she fell. Used a walking stick when walking. Now has a fear of falling. Reports no pain, just strength deficits.  PERTINENT HISTORY: 2 TKA L hand reconstruction, ~8015 C-Section Thyroidectomy  Hysterectomy  PAIN:  Are you having pain? No  PRECAUTIONS: None  RED FLAGS: None   WEIGHT BEARING RESTRICTIONS: No  FALLS:  Has patient fallen in last 6 months? Yes. Number of falls 2, due to tripping over cat and dizziness (side effect from medication, no longer taking)   LIVING ENVIRONMENT: Lives with: lives alone Stairs: Yes: External: 3 steps; can reach both Has following equipment at home: None  OCCUPATION: Retired  PLOF: Independent  PATIENT GOALS: Want to improve strength in hands and arms, wants to get better with sit to stand  NEXT MD VISIT: August 2025  OBJECTIVE:  Note: Objective measures were completed at  Evaluation unless otherwise noted.  DIAGNOSTIC FINDINGS: N/A  PATIENT SURVEYS:  Lower Extremity Functional Score: 55 / 80 = 68.8 % QuickDASH Score: 36.4 / 100 = 36.4 %  COGNITION: Overall cognitive status: Within functional limits for tasks assessed     SENSATION: Light touch: Impaired  on L2 on R dermatome  POSTURE: rounded shoulders, forward head, and increased thoracic kyphosis    LOWER EXTREMITY MMT:  MMT Right eval Left eval  Hip flexion 4 4+  Hip extension 3+ 3+  Hip abduction 4 4  Hip adduction    Hip internal rotation    Hip external rotation    Knee flexion    Knee extension    Ankle dorsiflexion 5 5  Ankle plantarflexion    Ankle inversion    Ankle eversion     (Blank rows = not tested) UPPER EXTREMITY ROM:   Active ROM Right eval Left eval  Shoulder flexion 118 118  Shoulder extension    Shoulder abduction 120 120  Shoulder adduction    Shoulder internal rotation    Shoulder external rotation    Elbow flexion    Elbow extension    Wrist flexion    Wrist extension    Wrist ulnar deviation    Wrist radial deviation    Wrist pronation    Wrist supination     UPPER EXTREMITY MMT:  MMT Right eval Left eval  Shoulder flexion 4- 4-  Shoulder extension    Shoulder abduction 4- 4-  Shoulder adduction    Shoulder internal rotation 3+ 3+  Shoulder external rotation 3+ 3+  Middle trapezius    Lower trapezius    Elbow flexion    Elbow extension    Wrist flexion    Wrist extension    Wrist ulnar deviation    Wrist radial deviation    Wrist pronation    Wrist supination    Grip strength (lbs)    (Blank rows = not tested) (Blank rows = not tested)   FUNCTIONAL TESTS:  2 minute walk test: Next session Grip Strength: R: 25 lb  L: 20 lb 30 seconds chair stand: 4 STS, UE pushing from LE  12/16/23: 321ft decreased arm swing.  GAIT: Distance walked: 60 Assistive device utilized: None Level of assistance: Complete  Independence Comments: Dec velocity, dec step length bilat, dec hip ext bilat, forward lean  TREATMENT DATE:  12/28/2023  -4in front step ups x 20 with single UE -4in lateral step ups x 10 bilaterally with UE  support -foam pad step up and contralateral UE punch with 2lb DB 3 x 1' -Heel raises on decline with UE assist 2 x 15 w/ 3'' -Toe raises on incline with UE assist x 15 w/ 3'' -Farmers carry with 10lb Kb with agility ladder x4 in each arm -52ft x 3 with tidal tank carry with high knee marching -44ft x 3 with backwards walking- cues for increased step length -upright row x 15   12/23/23: Standing:  - Toe tap with opposite UE flexed  - Forward step up 6in step height 10 with 1 HHA  - Rebounder with small green weight ball with STS  - Marching 1RT down hallway  - Arm swing down hallway 70 ft  - Tandem stance on foam 3x 30  - NBOS on foam UE flexion 20x  - Squat 10x 2 front of chair  - Sidestep down black line (~38ft) 2RT with RTB around thigh 2RT  - Vector stance 3x 5 [place] UE/LE L2 with average SPM 79 x 5'   12/22/23: Standing: Heel raises 15x Toe raises 15x Tap tapping 6in 3 sets 10 reps (last set trial with opposite UE/LE) 15 STS no HHA Hip abduction with RTB around thigh 2 sets 10 reps (Cueing to reduce ER) Hip extension with RTB around thigh 2x 10 Sidestep down black line (~40ft) 2RT with RTB around thigh 2RT Partial tandem stance 3x 30 (1st set static, 2nd set with UE flexion 10x each side, 3rd set with head turns 5x each) Squat 10x 2 front of chair   PATIENT EDUCATION:  Education details: PT evaluation, objective findings, POC, Importance of HEP, Precautions, Clinic policies  Person educated: Patient Education method: Explanation and Demonstration Education comprehension: verbalized understanding and returned  demonstration  HOME EXERCISE PROGRAM: Access Code: [email or web address] URL: [email or web address] Date: 12/02/2023 Prepared by: Rosaria Powell-Butler  Exercises - Sit to Stand  - 2 x daily - 7 x weekly - 3 sets - 10 reps - Standing Hip Extension with Counter Support  - 2 x daily - 7 x weekly - 3 sets - 10 reps - Putty Squeezes  - 2 x daily - 7 x weekly - 2 sets - 10 reps - 10 hold - Standing Shoulder Row with Anchored Resistance  - 2 x daily - 7 x weekly - 2 sets - 10 reps - Shoulder External Rotation with Anchored Resistance  - 2 x daily - 7 x weekly - 2 sets - 10 reps - Shoulder Internal Rotation with Resistance  - 2 x daily - 7 x weekly - 2 sets - 10 reps  12/16/23: - Heel Toe Raises with Counter Support  - 2 x daily - 7 x weekly - 2 sets - 10 reps  ASSESSMENT:  CLINICAL IMPRESSION: Patient tolerated treatment session well with focus on dynamic functional activities with coordinating proper step length, UE use and increasing functional strength and activity tolerance.  Patient with small loss of balances and constant cueing provided for proper step length pattern to make sure foot is clear throughout negotiating uneven surfaces.  Incorporated carrying activities promote upper body strengthening and core activation with activity tolerance. Pt will benefit from skilled Physical Therapy services to address deficits/limitations in order to improve functional and QOL.      Eval:  Patient is a 83 y.o. female who was seen today for  physical therapy evaluation and treatment for increase weakness in legs and hands. Patient arrives to PT evaluation with reports of a gradual decrease in LE strength and grip strength. Patient's daughter also present during session and contributes to subjective history taking. On this date, patient demonstrates decreased UE and LE strength, impaired grip strength (L>R), altered gait, decreased UE ROM, and dizziness with repetitive STS which are all affecting  patient's function. Patient and daughter describe with fatigue, patient tends to increased forward trunk flexion and take shorter quicker steps which increase her fear of falling. Time spent explaining possible physiology behind this and strategies to combat this, such as intentional rest breaks. Patient will benefit from skilled physical therapy in order to address the above to improve function and QOL.   OBJECTIVE IMPAIRMENTS: Abnormal gait, decreased activity tolerance, decreased balance, decreased endurance, decreased mobility, difficulty walking, decreased ROM, decreased strength, dizziness, impaired UE functional use, and postural dysfunction.   ACTIVITY LIMITATIONS: carrying, lifting, standing, transfers, dressing, and reach over head  PARTICIPATION LIMITATIONS: meal prep, cleaning, laundry, driving, community activity, and yard work  PERSONAL FACTORS: N/A are also affecting patient's functional outcome.   REHAB POTENTIAL: Good  CLINICAL DECISION MAKING: Stable/uncomplicated  EVALUATION COMPLEXITY: Low   GOALS: Goals reviewed with patient? No  SHORT TERM GOALS: Target date: 12/23/23 Patient will be independent with performance of HEP to demonstrate adequate self management of symptoms.  Baseline:  Goal status: INITIAL  2.   Patient will report at least a 25% improvement with function or pain overall since beginning PT. Baseline:  Goal status: INITIAL   LONG TERM GOALS: Target date: 01/20/24 Patient will improve LEFS score by 9 points to demonstrate improved perceived function while meeting [facility].  Baseline: Goal status: INITIAL Patient will improve QuickDASH score by 10 points to demonstrate improved perceived function while meeting [facility].  Baseline: Goal status: INITIAL 2.  Patient will improve TUG Score to 12 seconds or less to demonstrate decreased risk of falls.   Baseline:  Goal status: INITIAL 3.  Patient will improve L grip strength to at least 25 lb. To match R in  order to improve functional tasks such as opening jars.  Baseline:  Goal status: INITIAL   4.  Patient will improve 30 second chair stand test to at least 8 STS to demonstrate improved LE strength and endurance needed for community ambulation.  Baseline: Goal status: INITIAL   PLAN:  PT FREQUENCY: 2x/week  PT DURATION: 6 weeks  PLANNED INTERVENTIONS: 97164- PT Re-evaluation, 97110-Therapeutic exercises, 97530- Therapeutic activity, [id number]- Neuromuscular re-education, 97535- Self Care, [id number]- Manual therapy, [id number]- Gait training, [phone]- Electrical stimulation (manual), [id number]- Traction (mechanical), [phone] (1-2 muscles), [id number] (3+ muscles)- Dry Needling, Patient/Family education, Balance training, Stair training, Taping, Joint mobilization, Spinal mobilization, DME instructions, Cryotherapy, and Moist heat  PLAN FOR NEXT SESSION:  Progress UE and LE strengthening.  Gait training to improve arm swing.  Progress balance as able.    Omega JONETTA Bottcher PT, DPT [facility] [place] [phone] office  10:15 AM, 12/28/23

## 2023-12-29 ENCOUNTER — Other Ambulatory Visit: Payer: Self-pay | Admitting: Family Medicine

## 2023-12-29 DIAGNOSIS — Z1231 Encounter for screening mammogram for malignant neoplasm of breast: Secondary | ICD-10-CM

## 2023-12-29 DIAGNOSIS — E039 Hypothyroidism, unspecified: Secondary | ICD-10-CM | POA: Diagnosis not present

## 2023-12-30 ENCOUNTER — Encounter (HOSPITAL_COMMUNITY): Payer: Self-pay

## 2023-12-30 ENCOUNTER — Ambulatory Visit (HOSPITAL_COMMUNITY)

## 2023-12-30 DIAGNOSIS — M6281 Muscle weakness (generalized): Secondary | ICD-10-CM | POA: Diagnosis not present

## 2023-12-30 DIAGNOSIS — Z7409 Other reduced mobility: Secondary | ICD-10-CM | POA: Diagnosis not present

## 2023-12-30 NOTE — Therapy (Signed)
 OUTPATIENT PHYSICAL THERAPY LOWER EXTREMITY TREATMENT   Patient Name: Donna Daniels MRN: [id number] DOB:[birth year], 83 y.o., female Today's Date: 12/30/2023   Progress Note Reporting Period 12/02/23 to 12/30/23  See note below for Objective Data and Assessment of Progress/Goals.      END OF SESSION:  PT End of Session - 12/30/23 1106     Visit Number 6    Number of Visits 12    Date for PT Re-Evaluation 12/30/23    Authorization Type [employer] Dual complete    Authorization Time Period No auth    Progress Note Due on Visit 10    PT Start Time 1105    PT Stop Time 1145    PT Time Calculation (min) 40 min    Equipment Utilized During Treatment Gait belt    Activity Tolerance Patient tolerated treatment well    Behavior During Therapy WFL for tasks assessed/performed           Past Medical History:  Diagnosis Date   Arthritis    Bruises easily    Cataracts, bilateral    High cholesterol    takes SImvastin  daily   History of bronchitis    last time about 6yrs ago   Hypothyroidism    takes Synthroid  daily   Joint pain    Joint swelling    PONV (postoperative nausea and vomiting)    Sciatica    Thyroid  disorder    Past Surgical History:  Procedure Laterality Date   ABDOMINAL HYSTERECTOMY     ANTERIOR CERVICAL DECOMP/DISCECTOMY FUSION  12/18/2011   Procedure: ANTERIOR CERVICAL DECOMPRESSION/DISCECTOMY FUSION 2 LEVELS;  Surgeon: Darina MALVA Boehringer, MD;  Location: [facility];  Service: Neurosurgery;  Laterality: N/A;  Anterior cervical decompression fusion four-six   CESAREAN SECTION     x 1    COLONOSCOPY     HAND SURGERY Left    THYROIDECTOMY     TOTAL KNEE ARTHROPLASTY Right 04/18/2013   Procedure: TOTAL KNEE ARTHROPLASTY;  Surgeon: Maude KANDICE Herald, MD;  Location: [facility];  Service: Orthopedics;  Laterality: Right;   TOTAL KNEE ARTHROPLASTY Left 07/24/2013   DR DALLDORF   TOTAL KNEE ARTHROPLASTY Left 07/25/2013   Procedure: TOTAL KNEE ARTHROPLASTY;  Surgeon: Maude KANDICE Herald, MD;  Location: [facility];  Service: Orthopedics;  Laterality: Left;   VESICOVAGINAL FISTULA CLOSURE W/ TAH     Patient Active Problem List   Diagnosis Date Noted   Abnormality of gait 08/17/2013   Stiffness of left  knee 08/17/2013   S/P total knee replacement 07/25/2013   Stiffness of joint, not elsewhere classified, lower leg 05/03/2013   Difficulty walking 05/03/2013   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 Struggles to get dressed (takes her more time), struggles to get up from chair after sitting for a while, and struggles opening jars. Report she would walk with family last year before she fell. Used a walking stick when walking. Now has a fear of falling. Reports no pain, just strength deficits.  PERTINENT HISTORY: 2 TKA L hand reconstruction, ~8015 C-Section Thyroidectomy Hysterectomy  PAIN:  Are you having pain? No  PRECAUTIONS: None  RED FLAGS: None   WEIGHT BEARING RESTRICTIONS: No  FALLS:  Has patient fallen in last 6 months? Yes. Number of falls 2, due to tripping over cat and dizziness (side effect from medication, no longer taking)   LIVING ENVIRONMENT: Lives with: lives alone Stairs: Yes: External: 3 steps; can reach both Has following equipment at home: None  OCCUPATION: Retired  PLOF: Independent  PATIENT GOALS: Want to improve strength in hands and arms, wants to get better with sit to stand  NEXT MD VISIT: August 2025  OBJECTIVE:   Note: Objective measures were completed at Evaluation unless otherwise noted.  DIAGNOSTIC FINDINGS: N/A  PATIENT SURVEYS:  Lower Extremity Functional Score: 55 / 80 = 68.8 % QuickDASH Score: 36.4 / 100 = 36.4 %  12/30/23: Lower Extremity Functional Score: 63 / 80 = 78.8 % QuickDASH Score: 20.5 / 100 = 20.5 %  COGNITION: Overall cognitive status: Within functional limits for tasks assessed     SENSATION: Light touch: Impaired  on L2 on R dermatome  POSTURE: rounded shoulders, forward head, and increased thoracic kyphosis    LOWER EXTREMITY MMT:  MMT Right eval Left eval R 12/30/23 L 12/30/23  Hip flexion 4 4+ 4+ 4+  Hip extension 3+ 3+ 4- 4-  Hip abduction 4 4 4 4   Hip adduction      Hip internal rotation      Hip external rotation      Knee flexion      Knee extension      Ankle dorsiflexion 5 5    Ankle plantarflexion      Ankle inversion      Ankle eversion       (Blank rows = not tested) UPPER EXTREMITY ROM:   Active ROM Right eval Left eval R 12/30/23 L 12/30/23  Shoulder flexion 118 118 125 125  Shoulder extension      Shoulder abduction 120 120 120 120  Shoulder adduction      Shoulder internal rotation      Shoulder external rotation      Elbow flexion      Elbow extension      Wrist flexion      Wrist extension      Wrist ulnar deviation      Wrist radial deviation      Wrist pronation      Wrist supination       UPPER EXTREMITY MMT:  MMT Right eval Left eval R 12/30/23 L 12/30/23  Shoulder flexion 4- 4- 4+ 4+  Shoulder extension      Shoulder abduction 4- 4- 4 4  Shoulder adduction      Shoulder internal rotation 3+ 3+ 4- 4-  Shoulder external rotation 3+ 3+ 4- 4-  Middle trapezius      Lower trapezius      Elbow flexion      Elbow extension      Wrist flexion      Wrist extension      Wrist ulnar deviation      Wrist radial deviation      Wrist pronation      Wrist supination      Grip strength (lbs)      (Blank rows = not tested)  (Blank rows = not tested)   FUNCTIONAL TESTS:  2 minute walk test: Next session Grip Strength: R:  25 lb  L: 20 lb 30 seconds chair stand: 4 STS, UE pushing from LE  12/16/23: 350ft decreased arm swing.  12/30/23: 30 seconds chair stand: 10 STS, no UE use : 319 ft, dec arm swing initially, inc arm swing without cueing Grip strength:  R-30 lb    L-30 lb.    GAIT: Distance walked: 60 Assistive device utilized: None Level of assistance: Complete Independence Comments: Dec velocity, dec step length bilat, dec hip ext bilat, forward lean                                                                                                                                TREATMENT DATE:  12/30/23: Recumbent bike, seat 9, 5' Progress Note/re-eval LEFS QuickDASH LE MMT UE ROM UE MMT Test Grip strength 30 seconds stand test  12/28/2023  -4in front step ups x 20 with single UE -4in lateral step ups x 10 bilaterally with UE  support -foam pad step up and contralateral UE punch with 2lb DB 3 x 1' -Heel raises on decline with UE assist 2 x 15 w/ 3'' -Toe raises on incline with UE assist x 15 w/ 3'' -Farmers carry with 10lb Kb with agility ladder x4 in each arm -41ft x 3 with tidal tank carry with high knee marching -100ft x 3 with backwards walking- cues for increased step length -upright row x 15   12/23/23: Standing:  - Toe tap with opposite UE flexed  - Forward step up 6in step height 10 with 1 HHA  - Rebounder with small green weight ball with STS  - Marching 1RT down hallway  - Arm swing down hallway 70 ft  - Tandem stance on foam 3x 30  - NBOS on foam UE flexion 20x  - Squat 10x 2 front of chair  - Sidestep down black line (~44ft) 2RT with RTB around thigh 2RT  - Vector stance 3x 5 [place] UE/LE L2 with average SPM 79 x 5'    PATIENT EDUCATION:  Education details: PT evaluation, objective findings, POC, Importance of HEP, Precautions, Clinic  policies  Person educated: Patient Education method: Explanation and Demonstration Education comprehension: verbalized understanding and returned demonstration  HOME EXERCISE PROGRAM: Access Code: [email or web address] URL: [email or web address] Date: 12/02/2023 Prepared by: Rosaria Powell-Butler  Exercises - Sit to Stand  - 2 x daily - 7 x weekly - 3 sets - 10 reps - Standing Hip Extension with Counter Support  - 2 x daily - 7 x weekly - 3 sets - 10 reps - Putty Squeezes  - 2 x daily - 7 x weekly - 2 sets - 10 reps - 10 hold - Standing Shoulder Row with Anchored Resistance  - 2 x daily - 7 x weekly - 2 sets - 10 reps - Shoulder External Rotation with Anchored Resistance  - 2 x daily - 7 x weekly - 2 sets - 10 reps - Shoulder  Internal Rotation with Resistance  - 2 x daily - 7 x weekly - 2 sets - 10 reps  12/16/23: - Heel Toe Raises with Counter Support  - 2 x daily - 7 x weekly - 2 sets - 10 reps  ASSESSMENT:  CLINICAL IMPRESSION: Progress note performed this date. Patient demo improvement with self perceived function through LEFS and QuickDASH. Patient also demonstrated improved LE strength/power, UE ROM and strength, and grip strength. Patient remains around similar level with endurance via 2 minute walk test and continues to demonstrate dec step lengths, dec DF, dec arm swing and dec trunk rotation throughout gait. However, without cueing patient does initiate increased arm swing midway through 2 minute walk test. Patient making good progress towards personal and objective goals. Pt will benefit from skilled Physical Therapy services to address deficits/limitations in order to improve functional and QOL      Eval:  Patient is a 83 y.o. female who was seen today for physical therapy evaluation and treatment for increase weakness in legs and hands. Patient arrives to PT evaluation with reports of a gradual decrease in LE strength and grip strength. Patient's daughter also present during  session and contributes to subjective history taking. On this date, patient demonstrates decreased UE and LE strength, impaired grip strength (L>R), altered gait, decreased UE ROM, and dizziness with repetitive STS which are all affecting patient's function. Patient and daughter describe with fatigue, patient tends to increased forward trunk flexion and take shorter quicker steps which increase her fear of falling. Time spent explaining possible physiology behind this and strategies to combat this, such as intentional rest breaks. Patient will benefit from skilled physical therapy in order to address the above to improve function and QOL.   OBJECTIVE IMPAIRMENTS: Abnormal gait, decreased activity tolerance, decreased balance, decreased endurance, decreased mobility, difficulty walking, decreased ROM, decreased strength, dizziness, impaired UE functional use, and postural dysfunction.   ACTIVITY LIMITATIONS: carrying, lifting, standing, transfers, dressing, and reach over head  PARTICIPATION LIMITATIONS: meal prep, cleaning, laundry, driving, community activity, and yard work  PERSONAL FACTORS: N/A are also affecting patient's functional outcome.   REHAB POTENTIAL: Good  CLINICAL DECISION MAKING: Stable/uncomplicated  EVALUATION COMPLEXITY: Low   GOALS: Goals reviewed with patient? No  SHORT TERM GOALS: Target date: 12/23/23 Patient will be independent with performance of HEP to demonstrate adequate self management of symptoms.  Baseline:  Goal status:  MET  2.   Patient will report at least a 25% improvement with function or pain overall since beginning PT. Baseline:  Goal status: IN PROGRESS  LONG TERM GOALS: Target date: 01/20/24 Patient will improve LEFS score by 9 points to demonstrate improved perceived function while meeting [facility].  Baseline: Goal status: IN PROGRESS Patient will improve QuickDASH score by 10 points to demonstrate improved perceived function while meeting [facility].   Baseline: Goal status: MET  3.  Patient will improve L grip strength to at least 25 lb. To match R in order to improve functional tasks such as opening jars.  Baseline:  Goal status: MET   4.  Patient will improve 30 second chair stand test to at least 12 STS to demonstrate improved LE strength and endurance needed for community ambulation.  Baseline: 10 STS ON 12/30/23 Goal status: REVISED ON 12/30/23   PLAN:  PT FREQUENCY: 2x/week  PT DURATION: 6 weeks  PLANNED INTERVENTIONS: 97164- PT Re-evaluation, 97110-Therapeutic exercises, 97530- Therapeutic activity, 97112- Neuromuscular re-education, 97535- Self Care, [id number]- Manual therapy, [id number]- Gait training, [phone]- Electrical  stimulation (manual), [id number]- Traction (mechanical), [id number] (1-2 muscles), 20561 (3+ muscles)- Dry Needling, Patient/Family education, Balance training, Stair training, Taping, Joint mobilization, Spinal mobilization, DME instructions, Cryotherapy, and Moist heat  PLAN FOR NEXT SESSION:  Progress UE and LE strengthening.  Gait training to improve arm swing.  Progress balance as able.    1:04 PM, 12/30/23 Rosaria Settler, PT, DPT [facility]

## 2024-01-04 ENCOUNTER — Encounter (HOSPITAL_COMMUNITY): Payer: Self-pay

## 2024-01-04 ENCOUNTER — Ambulatory Visit (HOSPITAL_COMMUNITY)

## 2024-01-04 DIAGNOSIS — M6281 Muscle weakness (generalized): Secondary | ICD-10-CM

## 2024-01-04 DIAGNOSIS — Z7409 Other reduced mobility: Secondary | ICD-10-CM

## 2024-01-04 NOTE — Therapy (Signed)
 OUTPATIENT PHYSICAL THERAPY LOWER EXTREMITY TREATMENT   Patient Name: Donna Daniels MRN: [id number] DOB:11-May-1941, 83 y.o., female Today's Date: 01/04/2024   Progress Note Reporting Period 12/02/23 to 12/30/23  See note below for Objective Data and Assessment of Progress/Goals.      END OF SESSION:  PT End of Session - 01/04/24 1023     Visit Number 7    Number of Visits 12    Date for PT Re-Evaluation 12/30/23    Authorization Type [employer] Dual complete    Authorization Time Period No auth    PT Start Time 1019    PT Stop Time 1057    PT Time Calculation (min) 38 min    Equipment Utilized During Treatment Gait belt    Activity Tolerance Patient tolerated treatment well    Behavior During Therapy WFL for tasks assessed/performed            Past Medical History:  Diagnosis Date   Arthritis    Bruises easily    Cataracts, bilateral    High cholesterol    takes SImvastin  daily   History of bronchitis    last time about 62yrs ago   Hypothyroidism    takes Synthroid  daily   Joint pain    Joint swelling    PONV (postoperative nausea and vomiting)    Sciatica    Thyroid  disorder    Past Surgical History:  Procedure Laterality Date   ABDOMINAL HYSTERECTOMY     ANTERIOR CERVICAL DECOMP/DISCECTOMY FUSION  12/18/2011   Procedure: ANTERIOR CERVICAL DECOMPRESSION/DISCECTOMY FUSION 2 LEVELS;  Surgeon: Darina MALVA Boehringer, MD;  Location: [facility];  Service: Neurosurgery;  Laterality: N/A;  Anterior cervical decompression fusion four-six   CESAREAN SECTION     x 1    COLONOSCOPY     HAND SURGERY Left    THYROIDECTOMY     TOTAL KNEE ARTHROPLASTY Right 04/18/2013   Procedure: TOTAL KNEE ARTHROPLASTY;  Surgeon: Maude KANDICE Herald, MD;  Location: [facility];  Service: Orthopedics;  Laterality: Right;   TOTAL KNEE ARTHROPLASTY Left 07/24/2013   DR DALLDORF   TOTAL KNEE ARTHROPLASTY Left 07/25/2013   Procedure: TOTAL KNEE ARTHROPLASTY;  Surgeon: Maude KANDICE Herald, MD;  Location: [facility];  Service: Orthopedics;  Laterality: Left;   VESICOVAGINAL FISTULA CLOSURE W/ TAH     Patient Active Problem List   Diagnosis Date Noted   Abnormality of gait 08/17/2013   Stiffness of left  knee 08/17/2013   S/P total knee replacement 07/25/2013   Stiffness of joint, not elsewhere classified, lower leg 05/03/2013   Difficulty walking 05/03/2013   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 Struggles to get dressed (takes her more time), struggles to get up from chair after sitting for a while, and struggles opening jars. Report she would walk with family last year before she fell. Used a walking stick when walking. Now has a fear of falling. Reports no pain, just strength deficits.  PERTINENT HISTORY: 2 TKA L hand reconstruction, [phone]  C-Section Thyroidectomy Hysterectomy  PAIN:  Are you having pain? No  PRECAUTIONS: None  RED FLAGS: None   WEIGHT BEARING RESTRICTIONS: No  FALLS:  Has patient fallen in last 6 months? Yes. Number of falls 2, due to tripping over cat and dizziness (side effect from medication, no longer taking)   LIVING ENVIRONMENT: Lives with: lives alone Stairs: Yes: External: 3 steps; can reach both Has following equipment at home: None  OCCUPATION: Retired  PLOF: Independent  PATIENT GOALS: Want to improve strength in hands and arms, wants to get better with sit to stand  NEXT MD VISIT: August 2025  OBJECTIVE:  Note: Objective measures were  completed at Evaluation unless otherwise noted.  DIAGNOSTIC FINDINGS: N/A  PATIENT SURVEYS:  Lower Extremity Functional Score: 55 / 80 = 68.8 % QuickDASH Score: 36.4 / 100 = 36.4 %  12/30/23: Lower Extremity Functional Score: 63 / 80 = 78.8 % QuickDASH Score: 20.5 / 100 = 20.5 %  COGNITION: Overall cognitive status: Within functional limits for tasks assessed     SENSATION: Light touch: Impaired  on L2 on R dermatome  POSTURE: rounded shoulders, forward head, and increased thoracic kyphosis    LOWER EXTREMITY MMT:  MMT Right eval Left eval R 12/30/23 L 12/30/23  Hip flexion 4 4+ 4+ 4+  Hip extension 3+ 3+ 4- 4-  Hip abduction 4 4 4 4   Hip adduction      Hip internal rotation      Hip external rotation      Knee flexion      Knee extension      Ankle dorsiflexion 5 5    Ankle plantarflexion      Ankle inversion      Ankle eversion       (Blank rows = not tested) UPPER EXTREMITY ROM:   Active ROM Right eval Left eval R 12/30/23 L 12/30/23  Shoulder flexion 118 118 125 125  Shoulder extension      Shoulder abduction 120 120 120 120  Shoulder adduction      Shoulder internal rotation      Shoulder external rotation      Elbow flexion      Elbow extension      Wrist flexion      Wrist extension      Wrist ulnar deviation      Wrist radial deviation      Wrist pronation      Wrist supination       UPPER EXTREMITY MMT:  MMT Right eval Left eval R 12/30/23 L 12/30/23  Shoulder flexion 4- 4- 4+ 4+  Shoulder extension      Shoulder abduction 4- 4- 4 4  Shoulder adduction      Shoulder internal rotation 3+ 3+ 4- 4-  Shoulder external rotation 3+ 3+ 4- 4-  Middle trapezius      Lower trapezius      Elbow flexion      Elbow extension      Wrist flexion      Wrist extension      Wrist ulnar deviation      Wrist radial deviation      Wrist pronation      Wrist supination      Grip strength (lbs)      (Blank rows = not tested) (Blank rows = not  tested)   FUNCTIONAL TESTS:  2 minute walk test: Next session Grip Strength: R: 25 lb  L: 20 lb 30 seconds  chair stand: 4 STS, UE pushing from LE  12/16/23: 39ft decreased arm swing.  12/30/23: 30 seconds chair stand: 10 STS, no UE use : 319 ft, dec arm swing initially, inc arm swing without cueing Grip strength:  R-30 lb    L-30 lb.    GAIT: Distance walked: 60 Assistive device utilized: None Level of assistance: Complete Independence Comments: Dec velocity, dec step length bilat, dec hip ext bilat, forward lean                                                                                                                                TREATMENT DATE:  01/04/2024  Therapeutic Exercise: -Bike, seat 9, 5 minutes, level 1 resistance -Kettle bell 10 lbs deadlift from 12 inch step, 3 sets of 5 reps, pt cued for upright trunk and maintaining of neutral spine -Lateral stepping 2 aps 40 feet per lap, with GTB around ankles, pt cued for upright posture -Forward lunges on bosu ball, 2 set of 10 reps, pt cued for core activation and upright posture   Neuromuscular Re-education: -Speed step ups, 8 inch step, 30 second bouts, 13, 14 reps -Tidal tank at chest with walking marches/butt kicks, 2 laps, on 20 foot black line, pt cued for max LE ROM -Squat to chair with tidal tank at chest, 3 sets of 10 reps, pt cued for core activation -Trampoline toss: yellow ball  -NBOS, 10 throws  -One foot on blue foam, 10 throws bilaterally  -Both feet on blue foam, 20 throws   12/30/23: Recumbent bike, seat 9, 5' Progress Note/re-eval LEFS QuickDASH LE MMT UE ROM UE MMT Test Grip strength 30 seconds stand test  12/28/2023  -4in front step ups x 20 with single UE -4in lateral step ups x 10 bilaterally with UE  support -foam pad step up and contralateral UE punch with 2lb DB 3 x 1' -Heel raises on decline with UE assist 2 x 15 w/ 3'' -Toe raises on incline with UE assist x 15 w/  3'' -Farmers carry with 10lb Kb with agility ladder x4 in each arm -56ft x 3 with tidal tank carry with high knee marching -42ft x 3 with backwards walking- cues for increased step length -upright row x 15  PATIENT EDUCATION:  Education details: PT evaluation, objective findings, POC, Importance of HEP, Precautions, Clinic policies  Person educated: Patient Education method: Explanation and Demonstration Education comprehension: verbalized understanding and returned demonstration  HOME EXERCISE PROGRAM: Access Code: [email or web address] URL: [email or web address] Date: 12/02/2023 Prepared by: Rosaria Powell-Butler  Exercises - Sit to Stand  - 2 x daily - 7 x weekly - 3 sets - 10 reps - Standing Hip Extension with Counter Support  - 2 x daily - 7 x weekly - 3 sets - 10 reps - Putty Squeezes  - 2 x daily - 7 x weekly - 2 sets - 10 reps - 10 hold - Standing Shoulder Row with  Anchored Resistance  - 2 x daily - 7 x weekly - 2 sets - 10 reps - Shoulder External Rotation with Anchored Resistance  - 2 x daily - 7 x weekly - 2 sets - 10 reps - Shoulder Internal Rotation with Resistance  - 2 x daily - 7 x weekly - 2 sets - 10 reps  12/16/23: - Heel Toe Raises with Counter Support  - 2 x daily - 7 x weekly - 2 sets - 10 reps  ASSESSMENT:  CLINICAL IMPRESSION: Patient continues to demonstrate decreased LE strength, decreased gait quality and balance. Patient also demonstrates slight tremor in UEs during session today, pt states she did not have any tremors last year but they have worsened some, hoping the medication will help. Patient able to progress dynamic balance and core activation exercises today with banded walks and bosu ball lunges, good performance with verbal cueing. Patient would continue to benefit from skilled physical therapy for increased endurance with ambulation, increased LE strength, and improved balance for improved quality of life, improved independence with community  ambulation and continued progress towards therapy goals.      Eval:  Patient is a 83 y.o. female who was seen today for physical therapy evaluation and treatment for increase weakness in legs and hands. Patient arrives to PT evaluation with reports of a gradual decrease in LE strength and grip strength. Patient's daughter also present during session and contributes to subjective history taking. On this date, patient demonstrates decreased UE and LE strength, impaired grip strength (L>R), altered gait, decreased UE ROM, and dizziness with repetitive STS which are all affecting patient's function. Patient and daughter describe with fatigue, patient tends to increased forward trunk flexion and take shorter quicker steps which increase her fear of falling. Time spent explaining possible physiology behind this and strategies to combat this, such as intentional rest breaks. Patient will benefit from skilled physical therapy in order to address the above to improve function and QOL.   OBJECTIVE IMPAIRMENTS: Abnormal gait, decreased activity tolerance, decreased balance, decreased endurance, decreased mobility, difficulty walking, decreased ROM, decreased strength, dizziness, impaired UE functional use, and postural dysfunction.   ACTIVITY LIMITATIONS: carrying, lifting, standing, transfers, dressing, and reach over head  PARTICIPATION LIMITATIONS: meal prep, cleaning, laundry, driving, community activity, and yard work  PERSONAL FACTORS: N/A are also affecting patient's functional outcome.   REHAB POTENTIAL: Good  CLINICAL DECISION MAKING: Stable/uncomplicated  EVALUATION COMPLEXITY: Low   GOALS: Goals reviewed with patient? No  SHORT TERM GOALS: Target date: 12/23/23 Patient will be independent with performance of HEP to demonstrate adequate self management of symptoms.  Baseline:  Goal status:  MET  2.   Patient will report at least a 25% improvement with function or pain overall since  beginning PT. Baseline:  Goal status: IN PROGRESS  LONG TERM GOALS: Target date: 01/20/24 Patient will improve LEFS score by 9 points to demonstrate improved perceived function while meeting [facility].  Baseline: Goal status: IN PROGRESS Patient will improve QuickDASH score by 10 points to demonstrate improved perceived function while meeting [facility].  Baseline: Goal status: MET  3.  Patient will improve L grip strength to at least 25 lb. To match R in order to improve functional tasks such as opening jars.  Baseline:  Goal status: MET   4.  Patient will improve 30 second chair stand test to at least 12 STS to demonstrate improved LE strength and endurance needed for community ambulation.  Baseline: 10 STS ON 12/30/23 Goal  status: REVISED ON 12/30/23   PLAN:  PT FREQUENCY: 2x/week  PT DURATION: 6 weeks  PLANNED INTERVENTIONS: 97164- PT Re-evaluation, 97110-Therapeutic exercises, 97530- Therapeutic activity, 97112- Neuromuscular re-education, 97535- Self Care, [id number]- Manual therapy, [id number]- Gait training, [phone]- Electrical stimulation (manual), [id number]- Traction (mechanical), [phone] (1-2 muscles), [id number] (3+ muscles)- Dry Needling, Patient/Family education, Balance training, Stair training, Taping, Joint mobilization, Spinal mobilization, DME instructions, Cryotherapy, and Moist heat  PLAN FOR NEXT SESSION:  Progress UE and LE strengthening.  Gait training to improve arm swing.  Progress balance as able.    Lang Ada, PT, DPT [facility] Office: [phone] 11:02 AM, 01/04/24

## 2024-01-06 ENCOUNTER — Encounter (HOSPITAL_COMMUNITY): Payer: Self-pay

## 2024-01-06 ENCOUNTER — Ambulatory Visit (HOSPITAL_COMMUNITY)

## 2024-01-06 DIAGNOSIS — M6281 Muscle weakness (generalized): Secondary | ICD-10-CM

## 2024-01-06 DIAGNOSIS — Z7409 Other reduced mobility: Secondary | ICD-10-CM | POA: Diagnosis not present

## 2024-01-06 NOTE — Therapy (Signed)
 OUTPATIENT PHYSICAL THERAPY LOWER EXTREMITY TREATMENT/DISCHARGE  PHYSICAL THERAPY DISCHARGE SUMMARY  Visits from Start of Care: 7  Current functional level related to goals / functional outcomes: MET   Remaining deficits: NONE   Education / Equipment: HEP and increased activity at home, HEP updated.   Patient agrees to discharge. Patient goals were met. Patient is being discharged due to meeting the stated rehab goals.  Patient Name: Donna Daniels MRN: [id number] DOB:06-03-1941, 83 y.o., female Today's Date: 01/06/2024   END OF SESSION:  PT End of Session - 01/06/24 1301     Visit Number 8    Number of Visits 12    Date for PT Re-Evaluation 12/30/23    Authorization Type [employer] Dual complete    Authorization Time Period No auth    Progress Note Due on Visit 10    PT Start Time 1302    PT Stop Time 1340    PT Time Calculation (min) 38 min    Equipment Utilized During Treatment Gait belt    Activity Tolerance Patient tolerated treatment well    Behavior During Therapy WFL for tasks assessed/performed             Past Medical History:  Diagnosis Date   Arthritis    Bruises easily    Cataracts, bilateral    High cholesterol    takes SImvastin  daily   History of bronchitis    last time about 59yrs ago   Hypothyroidism    takes Synthroid  daily   Joint pain    Joint swelling    PONV (postoperative nausea and vomiting)    Sciatica    Thyroid  disorder    Past Surgical History:  Procedure Laterality Date   ABDOMINAL HYSTERECTOMY     ANTERIOR CERVICAL DECOMP/DISCECTOMY FUSION  12/18/2011   Procedure: ANTERIOR CERVICAL DECOMPRESSION/DISCECTOMY FUSION 2 LEVELS;  Surgeon: Darina MALVA Boehringer, MD;  Location: [facility];  Service: Neurosurgery;  Laterality: N/A;  Anterior cervical decompression fusion four-six   CESAREAN SECTION     x 1    COLONOSCOPY     HAND SURGERY Left    THYROIDECTOMY     TOTAL KNEE ARTHROPLASTY Right 04/18/2013   Procedure: TOTAL KNEE  ARTHROPLASTY;  Surgeon: Maude KANDICE Herald, MD;  Location: [facility];  Service: Orthopedics;  Laterality: Right;   TOTAL KNEE ARTHROPLASTY Left 07/24/2013   DR DALLDORF   TOTAL KNEE ARTHROPLASTY Left 07/25/2013   Procedure: TOTAL KNEE ARTHROPLASTY;  Surgeon: Maude KANDICE Herald, MD;  Location: [facility];  Service: Orthopedics;  Laterality: Left;   VESICOVAGINAL FISTULA CLOSURE W/ TAH     Patient Active Problem List   Diagnosis Date Noted   Abnormality of gait 08/17/2013   Stiffness of left  knee 08/17/2013   S/P total knee replacement 07/25/2013   Stiffness of joint, not elsewhere classified, lower leg 05/03/2013   Difficulty walking 05/03/2013   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 Pt states she is not scared of falling and can open water bottles with no issue.  Eval:  Patient and daughter report new decreased LE and grip strength problems started this year. Struggles to get dressed (takes her more time), struggles to get up from chair after sitting for a while, and struggles opening jars. Report she would walk with family last year before she fell. Used a walking stick when walking. Now has a fear of falling. Reports no pain, just strength deficits.  PERTINENT HISTORY: 2 TKA L hand reconstruction, ~8015 C-Section Thyroidectomy Hysterectomy  PAIN:  Are you having pain? No  PRECAUTIONS: None  RED FLAGS: None   WEIGHT BEARING RESTRICTIONS: No  FALLS:  Has patient fallen in last 6 months? Yes. Number of  falls 2, due to tripping over cat and dizziness (side effect from medication, no longer taking)   LIVING ENVIRONMENT: Lives with: lives alone Stairs: Yes: External: 3 steps; can reach both Has following equipment at home: None  OCCUPATION: Retired  PLOF: Independent  PATIENT GOALS: Want to improve strength in hands and arms, wants to get better with sit to stand  NEXT MD VISIT: August 2025  OBJECTIVE:  Note: Objective measures were completed at Evaluation unless otherwise noted.  DIAGNOSTIC FINDINGS: N/A  PATIENT SURVEYS:  Lower Extremity Functional Score: 55 / 80 = 68.8 % QuickDASH Score: 36.4 / 100 = 36.4 %  12/30/23: Lower Extremity Functional Score: 63 / 80 = 78.8 % QuickDASH Score: 20.5 / 100 = 20.5 %  01/06/24: LEFS: 65/80  COGNITION: Overall cognitive status: Within functional limits for tasks assessed     SENSATION: Light touch: Impaired  on L2 on R dermatome  POSTURE: rounded shoulders, forward head, and increased thoracic kyphosis    LOWER EXTREMITY MMT:  MMT Right eval Left eval R 12/30/23 L 12/30/23  Hip flexion 4 4+ 4+ 4+  Hip extension 3+ 3+ 4- 4-  Hip abduction 4 4 4 4   Hip adduction      Hip internal rotation      Hip external rotation      Knee flexion      Knee extension      Ankle dorsiflexion 5 5    Ankle plantarflexion      Ankle inversion      Ankle eversion       (Blank rows = not tested) UPPER EXTREMITY ROM:   Active ROM Right eval Left eval R 12/30/23 L 12/30/23  Shoulder flexion 118 118 125 125  Shoulder extension      Shoulder abduction 120 120 120 120  Shoulder adduction      Shoulder internal rotation      Shoulder external rotation      Elbow flexion      Elbow extension      Wrist flexion      Wrist extension      Wrist ulnar deviation      Wrist radial deviation      Wrist pronation      Wrist supination       UPPER EXTREMITY MMT:  MMT Right eval Left eval R 12/30/23 L 12/30/23  Shoulder flexion 4- 4- 4+ 4+   Shoulder extension      Shoulder abduction 4- 4- 4 4  Shoulder adduction      Shoulder internal rotation 3+ 3+ 4- 4-  Shoulder external rotation 3+ 3+ 4- 4-  Middle trapezius      Lower trapezius      Elbow flexion      Elbow extension      Wrist flexion  Wrist extension      Wrist ulnar deviation      Wrist radial deviation      Wrist pronation      Wrist supination      Grip strength (lbs)      (Blank rows = not tested) (Blank rows = not tested)   FUNCTIONAL TESTS:  2 minute walk test: Next session Grip Strength: R: 25 lb  L: 20 lb 30 seconds chair stand: 4 STS, UE pushing from LE  12/16/23: 326ft decreased arm swing.  12/30/23: 30 seconds chair stand: 10 STS, no UE use : 319 ft, dec arm swing initially, inc arm swing without cueing Grip strength:  R-30 lb    L-30 lb.   01/06/24: 30 seconds chair stand: 14 STS, no UE use : 418 ft, dec arm swing initially, inc arm swing with verbal cuing Grip strength:  R-44 lb    L-41 lb.    GAIT: Distance walked: 60 Assistive device utilized: None Level of assistance: Complete Independence Comments: Dec velocity, dec step length bilat, dec hip ext bilat, forward lean                                                                                                                                TREATMENT DATE:  01/06/2024  Progress note: LEFS, grip strength, , 30 second chair stand test Therapeutic Exercise: -2 MWT -Kettle bell 10 lbs upright rows, 1 sets of 10 reps, pt cued for upright trunk and maintaining of neutral spine -Kettle bell 10 lbs swings, 2 sets of 10 reps, pt cued for upright trunk and maintaining of neutral spine -Standing shoulder rows/extensions 2 sets of 10 reps, GTB -Heel/toe raises 2 sets of 10 reps, pt cued for decreased UE support  Neuromuscular Re-education: -Forward lunges on bosu ball, 2 set of 10 reps, pt cued for core activation and upright posture -Squat to chair with tidal  tank at chest, 2 sets of 8 reps, pt cued for core activation     01/04/2024  Therapeutic Exercise: -Bike, seat 9, 5 minutes, level 1 resistance -Kettle bell 10 lbs deadlift from 12 inch step, 3 sets of 5 reps, pt cued for upright trunk and maintaining of neutral spine -Lateral stepping 2 aps 40 feet per lap, with GTB around ankles, pt cued for upright posture -Forward lunges on bosu ball, 2 set of 10 reps, pt cued for core activation and upright posture   Neuromuscular Re-education: -Speed step ups, 8 inch step, 30 second bouts, 13, 14 reps -Tidal tank at chest with walking marches/butt kicks, 2 laps, on 20 foot black line, pt cued for max LE ROM -Squat to chair with tidal tank at chest, 3 sets of 10 reps, pt cued for core activation -Trampoline toss: yellow ball  -NBOS, 10 throws  -One foot on blue foam, 10 throws bilaterally  -Both feet on blue foam, 20 throws   12/30/23: Recumbent bike, seat  9, 5' Progress Note/re-eval LEFS QuickDASH LE MMT UE ROM UE MMT Test Grip strength 30 seconds stand test   PATIENT EDUCATION:  Education details: PT evaluation, objective findings, POC, Importance of HEP, Precautions, Clinic policies  Person educated: Patient Education method: Explanation and Demonstration Education comprehension: verbalized understanding and returned demonstration  HOME EXERCISE PROGRAM: Access Code: [email or web address] URL: [email or web address] Date: 12/02/2023 Prepared by: Rosaria Powell-Butler  Exercises - Sit to Stand  - 2 x daily - 7 x weekly - 3 sets - 10 reps - Standing Hip Extension with Counter Support  - 2 x daily - 7 x weekly - 3 sets - 10 reps - Putty Squeezes  - 2 x daily - 7 x weekly - 2 sets - 10 reps - 10 hold - Standing Shoulder Row with Anchored Resistance  - 2 x daily - 7 x weekly - 2 sets - 10 reps - Shoulder External Rotation with Anchored Resistance  - 2 x daily - 7 x weekly - 2 sets - 10 reps - Shoulder Internal Rotation with  Resistance  - 2 x daily - 7 x weekly - 2 sets - 10 reps  12/16/23: - Heel Toe Raises with Counter Support  - 2 x daily - 7 x weekly - 2 sets - 10 reps Access Code: [email or web address] URL: [email or web address] Date: 01/06/2024 Prepared by: Lang Ada  Exercises - Kettlebell Deadlift  - 1 x daily - 7 x weekly - 3 sets - 10 reps - Goblet Squat with Kettlebell  - 1 x daily - 7 x weekly - 3 sets - 10 reps - Kettlebell Swing  - 1 x daily - 7 x weekly - 3 sets - 10 reps ASSESSMENT:  CLINICAL IMPRESSION: Patient has continued to demonstrate improved functional mobility, increased LE strength and grip strength. Patient also demonstrates good recall of previous exercises and requests kettle bell exercises to be printed as she has a kettle bell at home. Patient able to demonstrate ability to meet all therapy goals on this date. Patient to be discharged due to meeting all goals and being satisfied with current level of function. Pt was educated on importance of continued activity and was pointed to some community resources.      OBJECTIVE IMPAIRMENTS: Abnormal gait, decreased activity tolerance, decreased balance, decreased endurance, decreased mobility, difficulty walking, decreased ROM, decreased strength, dizziness, impaired UE functional use, and postural dysfunction.   ACTIVITY LIMITATIONS: carrying, lifting, standing, transfers, dressing, and reach over head  PARTICIPATION LIMITATIONS: meal prep, cleaning, laundry, driving, community activity, and yard work  PERSONAL FACTORS: N/A are also affecting patient's functional outcome.   REHAB POTENTIAL: Good  CLINICAL DECISION MAKING: Stable/uncomplicated  EVALUATION COMPLEXITY: Low   GOALS: Goals reviewed with patient? No  SHORT TERM GOALS: Target date: 12/23/23 Patient will be independent with performance of HEP to demonstrate adequate self management of symptoms.  Baseline:  Goal status:  MET  2.   Patient will report at least a  25% improvement with function or pain overall since beginning PT. Baseline:  Goal status: MET  LONG TERM GOALS: Target date: 01/20/24 Patient will improve LEFS score by 9 points to demonstrate improved perceived function while meeting [facility].  Baseline: Goal status: IN PROGRESS Patient will improve QuickDASH score by 10 points to demonstrate improved perceived function while meeting [facility].  Baseline: Goal status: MET  3.  Patient will improve L grip strength to at least 25 lb. To match R in order to improve functional tasks such as opening  jars.  Baseline:  Goal status: MET   4.  Patient will improve 30 second chair stand test to at least 12 STS to demonstrate improved LE strength and endurance needed for community ambulation.  Baseline: 10 STS ON 12/30/23 Goal status: REVISED ON 12/30/23 MET   PLAN:  PT FREQUENCY: 2x/week  PT DURATION: 6 weeks  PLANNED INTERVENTIONS: 97164- PT Re-evaluation, 97110-Therapeutic exercises, 97530- Therapeutic activity, 97112- Neuromuscular re-education, 97535- Self Care, 02859- Manual therapy, [phone]- Gait training, [phone]- Electrical stimulation (manual), [id number]- Traction (mechanical), [phone] (1-2 muscles), 20561 (3+ muscles)- Dry Needling, Patient/Family education, Balance training, Stair training, Taping, Joint mobilization, Spinal mobilization, DME instructions, Cryotherapy, and Moist heat  PLAN FOR NEXT SESSION:  Discharged  Lang Ada, PT, DPT [facility] Office: [phone] 1:49 PM, 01/06/24

## 2024-01-11 ENCOUNTER — Encounter (HOSPITAL_COMMUNITY)

## 2024-01-13 ENCOUNTER — Encounter (HOSPITAL_COMMUNITY)

## 2024-01-18 ENCOUNTER — Encounter (HOSPITAL_COMMUNITY)

## 2024-01-19 ENCOUNTER — Ambulatory Visit

## 2024-01-20 ENCOUNTER — Encounter (HOSPITAL_COMMUNITY)

## 2024-02-19 ENCOUNTER — Inpatient Hospital Stay (HOSPITAL_COMMUNITY)
Admission: EM | Admit: 2024-02-19 | Discharge: 2024-02-24 | DRG: 481 | Disposition: A | Discharge status: Skilled Nursing Facility | Attending: Internal Medicine | Admitting: Internal Medicine

## 2024-02-19 ENCOUNTER — Other Ambulatory Visit: Payer: Self-pay

## 2024-02-19 ENCOUNTER — Encounter (HOSPITAL_COMMUNITY): Payer: Self-pay | Admitting: Family Medicine

## 2024-02-19 ENCOUNTER — Emergency Department (HOSPITAL_COMMUNITY)

## 2024-02-19 DIAGNOSIS — W010XXA Fall on same level from slipping, tripping and stumbling without subsequent striking against object, initial encounter: Secondary | ICD-10-CM | POA: Diagnosis present

## 2024-02-19 DIAGNOSIS — J45909 Unspecified asthma, uncomplicated: Secondary | ICD-10-CM | POA: Diagnosis not present

## 2024-02-19 DIAGNOSIS — E785 Hyperlipidemia, unspecified: Secondary | ICD-10-CM | POA: Diagnosis not present

## 2024-02-19 DIAGNOSIS — E7800 Pure hypercholesterolemia, unspecified: Secondary | ICD-10-CM | POA: Diagnosis present

## 2024-02-19 DIAGNOSIS — M47816 Spondylosis without myelopathy or radiculopathy, lumbar region: Secondary | ICD-10-CM | POA: Diagnosis not present

## 2024-02-19 DIAGNOSIS — I959 Hypotension, unspecified: Secondary | ICD-10-CM | POA: Diagnosis not present

## 2024-02-19 DIAGNOSIS — S72142D Displaced intertrochanteric fracture of left femur, subsequent encounter for closed fracture with routine healing: Secondary | ICD-10-CM | POA: Diagnosis not present

## 2024-02-19 DIAGNOSIS — Z9181 History of falling: Secondary | ICD-10-CM | POA: Diagnosis not present

## 2024-02-19 DIAGNOSIS — Z741 Need for assistance with personal care: Secondary | ICD-10-CM | POA: Diagnosis not present

## 2024-02-19 DIAGNOSIS — E039 Hypothyroidism, unspecified: Secondary | ICD-10-CM | POA: Insufficient documentation

## 2024-02-19 DIAGNOSIS — Z7983 Long term (current) use of bisphosphonates: Secondary | ICD-10-CM | POA: Diagnosis not present

## 2024-02-19 DIAGNOSIS — S72002A Fracture of unspecified part of neck of left femur, initial encounter for closed fracture: Principal | ICD-10-CM | POA: Diagnosis present

## 2024-02-19 DIAGNOSIS — E440 Moderate protein-calorie malnutrition: Secondary | ICD-10-CM | POA: Diagnosis not present

## 2024-02-19 DIAGNOSIS — S72142A Displaced intertrochanteric fracture of left femur, initial encounter for closed fracture: Principal | ICD-10-CM | POA: Diagnosis present

## 2024-02-19 DIAGNOSIS — J3089 Other allergic rhinitis: Secondary | ICD-10-CM | POA: Diagnosis not present

## 2024-02-19 DIAGNOSIS — Z79899 Other long term (current) drug therapy: Secondary | ICD-10-CM | POA: Diagnosis not present

## 2024-02-19 DIAGNOSIS — Z4789 Encounter for other orthopedic aftercare: Secondary | ICD-10-CM | POA: Diagnosis not present

## 2024-02-19 DIAGNOSIS — Z90710 Acquired absence of both cervix and uterus: Secondary | ICD-10-CM

## 2024-02-19 DIAGNOSIS — D62 Acute posthemorrhagic anemia: Secondary | ICD-10-CM | POA: Diagnosis not present

## 2024-02-19 DIAGNOSIS — Z79890 Hormone replacement therapy (postmenopausal): Secondary | ICD-10-CM

## 2024-02-19 DIAGNOSIS — Z791 Long term (current) use of non-steroidal anti-inflammatories (NSAID): Secondary | ICD-10-CM

## 2024-02-19 DIAGNOSIS — Z01818 Encounter for other preprocedural examination: Secondary | ICD-10-CM | POA: Diagnosis not present

## 2024-02-19 DIAGNOSIS — M160 Bilateral primary osteoarthritis of hip: Secondary | ICD-10-CM | POA: Diagnosis not present

## 2024-02-19 DIAGNOSIS — S72002D Fracture of unspecified part of neck of left femur, subsequent encounter for closed fracture with routine healing: Secondary | ICD-10-CM | POA: Diagnosis not present

## 2024-02-19 DIAGNOSIS — Z96653 Presence of artificial knee joint, bilateral: Secondary | ICD-10-CM | POA: Diagnosis present

## 2024-02-19 DIAGNOSIS — M6281 Muscle weakness (generalized): Secondary | ICD-10-CM | POA: Diagnosis not present

## 2024-02-19 DIAGNOSIS — Z743 Need for continuous supervision: Secondary | ICD-10-CM | POA: Diagnosis not present

## 2024-02-19 DIAGNOSIS — Z981 Arthrodesis status: Secondary | ICD-10-CM | POA: Diagnosis not present

## 2024-02-19 DIAGNOSIS — W19XXXA Unspecified fall, initial encounter: Secondary | ICD-10-CM | POA: Diagnosis not present

## 2024-02-19 DIAGNOSIS — M25552 Pain in left hip: Secondary | ICD-10-CM | POA: Diagnosis not present

## 2024-02-19 DIAGNOSIS — S72102A Unspecified trochanteric fracture of left femur, initial encounter for closed fracture: Secondary | ICD-10-CM | POA: Diagnosis not present

## 2024-02-19 DIAGNOSIS — R5383 Other fatigue: Secondary | ICD-10-CM | POA: Diagnosis not present

## 2024-02-19 DIAGNOSIS — K219 Gastro-esophageal reflux disease without esophagitis: Secondary | ICD-10-CM | POA: Diagnosis not present

## 2024-02-19 DIAGNOSIS — E890 Postprocedural hypothyroidism: Secondary | ICD-10-CM | POA: Diagnosis not present

## 2024-02-19 DIAGNOSIS — R262 Difficulty in walking, not elsewhere classified: Secondary | ICD-10-CM | POA: Diagnosis not present

## 2024-02-19 LAB — COMPREHENSIVE METABOLIC PANEL WITH GFR
ALT: 15 U/L (ref 0–44)
AST: 22 U/L (ref 15–41)
Albumin: 3.7 g/dL (ref 3.5–5.0)
Alkaline Phosphatase: 55 U/L (ref 38–126)
Anion gap: 13 (ref 5–15)
BUN: 16 mg/dL (ref 8–23)
CO2: 25 mmol/L (ref 22–32)
Calcium: 9.2 mg/dL (ref 8.9–10.3)
Chloride: 101 mmol/L (ref 98–111)
Creatinine, Ser: 0.95 mg/dL (ref 0.44–1.00)
GFR, Estimated: 59 mL/min — ABNORMAL LOW (ref >60)
Glucose, Bld: 131 mg/dL — ABNORMAL HIGH (ref 70–99)
Potassium: 3.8 mmol/L (ref 3.5–5.1)
Sodium: 139 mmol/L (ref 135–145)
Total Bilirubin: 0.9 mg/dL (ref 0.0–1.2)
Total Protein: 6.7 g/dL (ref 6.5–8.1)

## 2024-02-19 LAB — CBC WITH DIFFERENTIAL/PLATELET
Abs Immature Granulocytes: 0.03 K/uL (ref 0.00–0.07)
Basophils Absolute: 0.1 K/uL (ref 0.0–0.1)
Basophils Relative: 1 %
Eosinophils Absolute: 0.1 K/uL (ref 0.0–0.5)
Eosinophils Relative: 1 %
HCT: 38.3 % (ref 36.0–46.0)
Hemoglobin: 12.4 g/dL (ref 12.0–15.0)
Immature Granulocytes: 0 %
Lymphocytes Relative: 26 %
Lymphs Abs: 2.3 K/uL (ref 0.7–4.0)
MCH: 29.7 pg (ref 26.0–34.0)
MCHC: 32.4 g/dL (ref 30.0–36.0)
MCV: 91.8 fL (ref 80.0–100.0)
Monocytes Absolute: 0.8 K/uL (ref 0.1–1.0)
Monocytes Relative: 9 %
Neutro Abs: 5.7 K/uL (ref 1.7–7.7)
Neutrophils Relative %: 63 %
Platelets: 232 K/uL (ref 150–400)
RBC: 4.17 MIL/uL (ref 3.87–5.11)
RDW: 13.4 % (ref 11.5–15.5)
WBC: 9 K/uL (ref 4.0–10.5)
nRBC: 0 % (ref 0.0–0.2)

## 2024-02-19 MED ORDER — LACTATED RINGERS IV SOLN: INTRAVENOUS | Status: AC

## 2024-02-19 MED ORDER — ROSUVASTATIN CALCIUM 20 MG PO TABS
20.0000 mg | ORAL_TABLET | Freq: Every day | ORAL | Status: DC
  Administered 2024-02-19 – 2024-02-23 (×5): 20 mg via ORAL
  Filled 2024-02-19 (×5): qty 1

## 2024-02-19 MED ORDER — PANTOPRAZOLE SODIUM 40 MG PO TBEC
40.0000 mg | DELAYED_RELEASE_TABLET | Freq: Every day | ORAL | Status: DC
  Administered 2024-02-19 – 2024-02-24 (×5): 40 mg via ORAL
  Filled 2024-02-19 (×5): qty 1

## 2024-02-19 MED ORDER — POVIDONE-IODINE 10 % EX SWAB
2.0000 | Freq: Once | CUTANEOUS | Status: AC
  Administered 2024-02-20: 2 via TOPICAL

## 2024-02-19 MED ORDER — LEVOTHYROXINE SODIUM 88 MCG PO TABS
88.0000 ug | ORAL_TABLET | Freq: Every day | ORAL | Status: DC
  Administered 2024-02-20 – 2024-02-24 (×5): 88 ug via ORAL
  Filled 2024-02-19 (×5): qty 1

## 2024-02-19 MED ORDER — CEFAZOLIN SODIUM-DEXTROSE 2-4 GM/100ML-% IV SOLN
2.0000 g | INTRAVENOUS | Status: AC
  Administered 2024-02-20: 2 g via INTRAVENOUS
  Filled 2024-02-19: qty 100

## 2024-02-19 MED ORDER — OYSTER SHELL CALCIUM/D3 500-5 MG-MCG PO TABS
1.0000 | ORAL_TABLET | Freq: Every day | ORAL | Status: DC
  Administered 2024-02-19 – 2024-02-24 (×5): 1 via ORAL
  Filled 2024-02-19 (×5): qty 1

## 2024-02-19 MED ORDER — MORPHINE SULFATE (PF) 4 MG/ML IV SOLN
4.0000 mg | INTRAVENOUS | Status: DC | PRN
  Administered 2024-02-19 – 2024-02-20 (×3): 4 mg via INTRAVENOUS
  Filled 2024-02-19 (×3): qty 1

## 2024-02-19 MED ORDER — ONDANSETRON HCL 4 MG/2ML IJ SOLN: 4.0000 mg | Freq: Four times a day (QID) | INTRAMUSCULAR | Status: DC | PRN

## 2024-02-19 MED ORDER — ONDANSETRON HCL 4 MG/2ML IJ SOLN
4.0000 mg | Freq: Once | INTRAMUSCULAR | Status: AC
  Administered 2024-02-19: 4 mg via INTRAVENOUS
  Filled 2024-02-19: qty 2

## 2024-02-19 MED ORDER — MORPHINE SULFATE (PF) 4 MG/ML IV SOLN
4.0000 mg | Freq: Once | INTRAVENOUS | Status: AC
  Administered 2024-02-19: 4 mg via INTRAVENOUS
  Filled 2024-02-19: qty 1

## 2024-02-19 NOTE — ED Notes (Signed)
 Socks, shoes, watch and 2 hearing aides going home with daughter.

## 2024-02-19 NOTE — H&P (Signed)
 History and Physical    Patient: Donna Daniels FMW:[id number] DOB: 11-28-1940 DOA: 02/19/2024 DOS: the patient was seen and examined on 02/19/2024 PCP: Marvine Rush, MD  Patient coming from: Home  Chief Complaint:  Chief Complaint  Patient presents with   Fall   HPI: Donna Daniels is a 83 y.o. female with medical history significant of hypothyroidism, hyperlipidemia, history of bilateral knee replacement.  Patient had a fall earlier today while in the garden.  She became off balance and fell on her right hip, having immediate pain in the left hip and was unable to move it.  A neighbor saw her and came to her aid.  EMS was called and she was transported to the hospital.  No fevers, chills, nausea, vomiting.  Denies chest pain, shortness of breath.  She is normally able to ambulate without too much difficulty.  No chest pain or shortness of breath with activity.  Review of Systems: As mentioned in the history of present illness. All other systems reviewed and are negative. Past Medical History:  Diagnosis Date   Arthritis    Bruises easily    Cataracts, bilateral    High cholesterol    takes SImvastin  daily   History of bronchitis    last time about 67yrs ago   Hypothyroidism    takes Synthroid  daily   Joint pain    Joint swelling    PONV (postoperative nausea and vomiting)    Sciatica    Thyroid  disorder    Past Surgical History:  Procedure Laterality Date   ABDOMINAL HYSTERECTOMY     ANTERIOR CERVICAL DECOMP/DISCECTOMY FUSION  12/18/2011   Procedure: ANTERIOR CERVICAL DECOMPRESSION/DISCECTOMY FUSION 2 LEVELS;  Surgeon: Darina MALVA Boehringer, MD;  Location: [facility];  Service: Neurosurgery;  Laterality: N/A;  Anterior cervical decompression fusion four-six   CESAREAN SECTION     x 1    COLONOSCOPY     HAND SURGERY Left    THYROIDECTOMY     TOTAL KNEE ARTHROPLASTY Right 04/18/2013   Procedure: TOTAL KNEE ARTHROPLASTY;  Surgeon: Maude KANDICE Herald, MD;  Location: [facility];   Service: Orthopedics;  Laterality: Right;   TOTAL KNEE ARTHROPLASTY Left 07/24/2013   DR DALLDORF   TOTAL KNEE ARTHROPLASTY Left 07/25/2013   Procedure: TOTAL KNEE ARTHROPLASTY;  Surgeon: Maude KANDICE Herald, MD;  Location: [facility];  Service: Orthopedics;  Laterality: Left;   VESICOVAGINAL FISTULA CLOSURE W/ TAH     Social History:  reports that she has never smoked. She has never used smokeless tobacco. She reports that she does not drink alcohol and does not use drugs.  No Known Allergies  Family History  Problem Relation Age of Onset   Breast cancer Neg Hx     Prior to Admission medications   Medication Sig Start Date End Date Taking? Authorizing Provider  calcium -vitamin D (OSCAL) 250-125 MG-UNIT per tablet Take 2 tablets by mouth daily.   Yes [provider]  cholecalciferol  (VITAMIN D) 1000 UNITS tablet Take 1,000 Units by mouth in the morning and at bedtime.   Yes [provider]  cyclobenzaprine  (FLEXERIL ) 10 MG tablet Take 10 mg by mouth 3 (three) times daily as needed for muscle spasms. 10/28/23  Yes [provider]  levocetirizine (XYZAL) 5 MG tablet Take 5 mg by mouth daily. 01/03/24  Yes [provider]  levothyroxine  (SYNTHROID ) 88 MCG tablet Take 88 mcg by mouth daily. 01/10/24  Yes [provider]  meloxicam (MOBIC) 15 MG tablet Take  15 mg by mouth daily as needed for pain. 02/08/24  Yes [provider]  Multiple Vitamin (MULTIVITAMIN WITH MINERALS) TABS Take 1 tablet by mouth daily.   Yes [provider]  Omega-3 Fatty Acids (FISH OIL) 1000 MG CAPS Take 1 capsule by mouth daily.   Yes [provider]  omeprazole (PRILOSEC) 20 MG capsule Take 20 mg by mouth daily. 02/08/24  Yes [provider]  rosuvastatin  (CRESTOR ) 20 MG tablet Take 20 mg by mouth at bedtime. 02/02/24  Yes [provider]    Physical Exam: Vitals:   02/19/24 1513 02/19/24 1515 02/19/24 1630 02/19/24 1639  BP:  (!) 155/81 (!)  145/87   Pulse:  94 94 90  Resp:  17    Temp:      TempSrc:      SpO2:  92% 93% 94%  Weight: 73.9 kg     Height: 5' 6 (1.676 m)      General: Elderly female. Awake and alert and oriented x3. No acute cardiopulmonary distress.  HEENT: Normocephalic atraumatic.  Right and left ears normal in appearance.  Pupils equal, round, reactive to light. Extraocular muscles are intact. Sclerae anicteric and noninjected.  Moist mucosal membranes. No mucosal lesions.  Neck: Neck supple without lymphadenopathy. No carotid bruits. No masses palpated.  Cardiovascular: Regular rate with normal S1-S2 sounds. No murmurs, rubs, gallops auscultated. No JVD.  Respiratory: Good respiratory effort with no wheezes, rales, rhonchi. Lungs clear to auscultation bilaterally.  No accessory muscle use. Abdomen: Soft, nontender, nondistended. Active bowel sounds. No masses or hepatosplenomegaly  Skin: No rashes, lesions, or ulcerations.  Dry, warm to touch. 2+ dorsalis pedis and radial pulses. Musculoskeletal: Left hip shortened and externally rotated.  No contractures  Psychiatric: Intact judgment and insight. Pleasant and cooperative. Neurologic: No focal neurological deficits. Strength is 5/5 and symmetric in upper and lower extremities.  Cranial nerves II through XII are grossly intact.  Data Reviewed: Labs and imaging reviewed by me.  Assessment and Plan: No notes have been filed under this hospital service. Service: Hospitalist  Principal Problem:   Closed left hip fracture (HCC) Active Problems:   High cholesterol   Hypothyroidism  Close left hip fracture Admit N.p.o. after midnight Surgery tomorrow Pain control IV fluids No current or past cardiac history Hyperlipidemia Continue statin Hypothyroidism Continue levothyroxine    Advance Care Planning:   Code Status: Full Code confirmed by patient  Consults: Orthopedics  Family Communication: No other number present during interview and  exam  Severity of Illness: The appropriate patient status for this patient is INPATIENT. Inpatient status is judged to be reasonable and necessary in order to provide the required intensity of service to ensure the patient's safety. The patient's presenting symptoms, physical exam findings, and initial radiographic and laboratory data in the context of their chronic comorbidities is felt to place them at high risk for further clinical deterioration. Furthermore, it is not anticipated that the patient will be medically stable for discharge from the hospital within 2 midnights of admission.   * I certify that at the point of admission it is my clinical judgment that the patient will require inpatient hospital care spanning beyond 2 midnights from the point of admission due to high intensity of service, high risk for further deterioration and high frequency of surveillance required.*  Author: [place] Kretz J Braydee Shimkus, DO 02/19/2024 4:53 PM  For on call review [email or web address].

## 2024-02-19 NOTE — ED Triage Notes (Signed)
 Pt was outside watering plants an fell onto her left hip. Left leg noted to be shortened. Bilateral arms have abrasions. Pt only c/o left hip pain. 100mcg of fentanyl  given in route. Pt has on 2 hearing aides. Pt also requests no one call her daughter.

## 2024-02-19 NOTE — ED Notes (Signed)
 Patient transported to X-ray

## 2024-02-19 NOTE — Consult Note (Signed)
 Brief orthopedic consult note:  Called by Zelda Salmon emergency room physician for this patient.  She is status post fall.  X-rays reveal displaced left intertrochanteric hip fracture.  She is ambulatory at baseline and therefore indicated for surgery.  Recommend transfer to [facility] for admission to the hospitalist service.  Will plan for surgery tomorrow morning.  N.p.o. past midnight.  Bedrest for now.  Formal consult to follow.

## 2024-02-19 NOTE — ED Provider Notes (Signed)
 [place] EMERGENCY DEPARTMENT AT [facility] Provider Note  CSN: [id number] Arrival date & time: 02/19/24 1500  Chief Complaint(s) Fall  HPI Donna Daniels is a 83 y.o. female with PMH hypothyroidism on Synthroid , HLD who presents emerged part for evaluation of a fall.  Patient states that she was in the garden, slipped and landed on her left hip.  Unable to walk after the fall.  Endorsing significant pain at the right hip.  Denies head strike or loss of consciousness.  Denies numbness, tingling, weakness or other neurologic complaints.  No blood thinner use.  Denies chest pain, shortness of breath, headache, fever or other systemic symptoms.   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 Authorizing Provider  alendronate (FOSAMAX) 70 MG tablet Take 70 mg by mouth every 7 (seven) days. Take with a full glass of water on an empty stomach. Patient takes on Saturdays    [provider]  aspirin  EC 325 MG EC tablet Take 1 tablet (325 mg total) by mouth 2 (two) times daily after a meal. 07/27/13   Johnita Sharper, PA-C  calcium -vitamin D (OSCAL) 250-125 MG-UNIT per tablet Take 2 tablets by mouth daily.     [provider]  cholecalciferol  (VITAMIN D) 1000 UNITS tablet Take 4,000 Units by mouth daily.    [provider]  HYDROcodone -acetaminophen  (NORCO/VICODIN) 5-325 MG per tablet Take 1-2 tablets by mouth every 6 (six) hours as needed for moderate pain. 07/27/13   Johnita Sharper, PA-C  levothyroxine  (SYNTHROID , LEVOTHROID) 100 MCG tablet Take 100 mcg by mouth daily.    [provider]  methocarbamol  (ROBAXIN ) 500 MG tablet Take 1 tablet (500 mg total) by mouth every 6 (six) hours as needed for muscle spasms. 07/27/13   Johnita Sharper, PA-C  Multiple Vitamin (MULTIVITAMIN WITH MINERALS) TABS Take 1 tablet by mouth daily.    [provider]  omega-3 acid ethyl esters (LOVAZA ) 1 G capsule Take 1 g by mouth 3 (three) times daily.    [provider]  simvastatin  (ZOCOR ) 40 MG tablet Take 40 mg by mouth every evening.    [provider]  Past Surgical History Past Surgical History:  Procedure Laterality Date   ABDOMINAL HYSTERECTOMY     ANTERIOR CERVICAL DECOMP/DISCECTOMY FUSION  12/18/2011   Procedure: ANTERIOR CERVICAL DECOMPRESSION/DISCECTOMY FUSION 2 LEVELS;  Surgeon: Darina MALVA Boehringer, MD;  Location: [facility];  Service: Neurosurgery;  Laterality: N/A;  Anterior cervical decompression fusion four-six   CESAREAN SECTION     x 1    COLONOSCOPY     HAND SURGERY Left    THYROIDECTOMY     TOTAL KNEE ARTHROPLASTY Right 04/18/2013   Procedure: TOTAL KNEE ARTHROPLASTY;  Surgeon: Maude KANDICE Herald, MD;  Location: [facility] OR;  Service: Orthopedics;  Laterality: Right;   TOTAL KNEE ARTHROPLASTY Left 07/24/2013   DR DALLDORF   TOTAL KNEE ARTHROPLASTY Left 07/25/2013   Procedure: TOTAL KNEE ARTHROPLASTY;  Surgeon: Maude KANDICE Herald, MD;  Location: [facility] OR;  Service: Orthopedics;  Laterality: Left;   VESICOVAGINAL FISTULA CLOSURE  W/ TAH     Family History Family History  Problem Relation Age of Onset   Breast cancer Neg Hx     Social History Social History   Tobacco Use   Smoking status: Never   Smokeless tobacco: Never  Substance Use Topics   Alcohol use: No   Drug use: No   Allergies Patient has no known allergies.  Review of Systems Review of Systems  Musculoskeletal:  Positive for arthralgias, joint swelling and myalgias.    Physical Exam Vital Signs  I have reviewed the triage vital signs BP (!) 155/81   Pulse 94   Temp 97.7 F (36.5 C) (Oral)   Resp 17   Ht 5' 6 (1.676 m)   Wt 73.9 kg   SpO2 92%   BMI 26.31 kg/m   Physical Exam Vitals and nursing note reviewed.  Constitutional:      General: She is not in acute distress.    Appearance: She is well-developed.  HENT:     Head: Normocephalic and atraumatic.  Eyes:     Conjunctiva/sclera: Conjunctivae normal.  Cardiovascular:     Rate and Rhythm: Normal rate and regular rhythm.     Heart sounds: No murmur heard. Pulmonary:     Effort: Pulmonary effort is normal. No respiratory distress.     Breath sounds: Normal breath sounds.  Abdominal:     Palpations: Abdomen is soft.     Tenderness: There is no abdominal tenderness.  Musculoskeletal:        General: Swelling, tenderness, deformity and signs of injury present.     Cervical back: Neck supple.  Skin:    General: Skin is warm and dry.     Capillary Refill: Capillary refill takes less than 2 seconds.  Neurological:     Mental Status: She is alert.  Psychiatric:        Mood and Affect: Mood normal.     ED Results and Treatments Labs (all labs ordered are listed, but only abnormal results are displayed) Labs Reviewed  COMPREHENSIVE METABOLIC PANEL WITH GFR - Abnormal; Notable for the following components:      Result Value   Glucose, Bld 131 (*)    GFR, Estimated 59 (*)    All other components within normal limits  CBC WITH DIFFERENTIAL/PLATELET  Radiology No results found.  Pertinent labs & imaging results that were available during my care of the patient were reviewed by me and considered in my medical decision making (see MDM for details).  Medications Ordered in ED Medications - No data to display                                                                                                                                   Procedures Procedures  (including critical care time)  Medical Decision Making / ED Course   This patient presents to the ED for concern of fall, this involves an extensive number of treatment options, and is a complaint that carries with it a high risk of complications and morbidity.  The differential diagnosis includes fracture, dislocation, hematoma, contusion, ligamentous injury  MDM: Patient seen emerged part for evaluation of a fall.  Physical exam with external rotation and shortening of the left lower extremity with tenderness at the left hip.  No external signs of trauma over the head, neck, chest abdomen or pelvis.  She has full range of motion of the neck.  Laboratory evaluation unremarkable.  X-ray imaging showing a left intertrochanteric hip fracture.  Spoke with the orthopedist on-call Dr. Elsa who is recommending admission to [facility] for surgical repair tomorrow.  Patient admitted   Additional history obtained: -Additional history obtained from family -External records from outside source obtained and reviewed including: Chart review including previous notes, labs, imaging, consultation notes   Lab Tests: -I ordered, reviewed, and interpreted labs.   The pertinent results include:   Labs Reviewed  COMPREHENSIVE METABOLIC PANEL WITH GFR - Abnormal; Notable for the following components:      Result Value   Glucose, Bld 131 (*)    GFR, Estimated 59 (*)    All other components  within normal limits  CBC WITH DIFFERENTIAL/PLATELET     Imaging Studies ordered: I ordered imaging studies including chest x-ray, hip x-ray I independently visualized and interpreted imaging. I agree with the radiologist interpretation   Medicines ordered and prescription drug management: No orders of the defined types were placed in this encounter.   -I have reviewed the patients home medicines and have made adjustments as needed  Critical interventions none  Consultations Obtained: I requested consultation with the orthopedic surgeon on-call,  and discussed lab and imaging findings as well as pertinent plan - they recommend: Transfer to [facility] for surgical repair tomorrow   Social Determinants of Health:  Factors impacting patients care include: none   Reevaluation: After the interventions noted above, I reevaluated the patient and found that they have :stayed the same  Co morbidities that complicate the patient evaluation  Past Medical History:  Diagnosis Date   Arthritis    Bruises easily    Cataracts, bilateral    High cholesterol    takes SImvastin  daily   History of bronchitis  last time about 71yrs ago   Hypothyroidism    takes Synthroid  daily   Joint pain    Joint swelling    PONV (postoperative nausea and vomiting)    Sciatica    Thyroid  disorder       Dispostion: I considered admission for this patient, and patient require hospital admission for hip fracture.     Final Clinical Impression(s) / ED Diagnoses Final diagnoses:  None     @PCDICTATION @    Albertina Dixon, MD 02/19/24 [phone]

## 2024-02-20 ENCOUNTER — Inpatient Hospital Stay (HOSPITAL_COMMUNITY): Admitting: Anesthesiology

## 2024-02-20 ENCOUNTER — Encounter (HOSPITAL_COMMUNITY): Payer: Self-pay | Admitting: Family Medicine

## 2024-02-20 ENCOUNTER — Other Ambulatory Visit: Payer: Self-pay

## 2024-02-20 ENCOUNTER — Encounter (HOSPITAL_COMMUNITY)
Admission: EM | Disposition: A | Discharge status: Skilled Nursing Facility | Payer: Self-pay | Source: Home / Self Care | Attending: Internal Medicine

## 2024-02-20 ENCOUNTER — Inpatient Hospital Stay (HOSPITAL_COMMUNITY)

## 2024-02-20 DIAGNOSIS — S72142A Displaced intertrochanteric fracture of left femur, initial encounter for closed fracture: Secondary | ICD-10-CM

## 2024-02-20 DIAGNOSIS — E039 Hypothyroidism, unspecified: Secondary | ICD-10-CM

## 2024-02-20 DIAGNOSIS — S72002A Fracture of unspecified part of neck of left femur, initial encounter for closed fracture: Secondary | ICD-10-CM | POA: Diagnosis not present

## 2024-02-20 HISTORY — PX: INTRAMEDULLARY (IM) NAIL INTERTROCHANTERIC: SHX5875

## 2024-02-20 LAB — SURGICAL PCR SCREEN
MRSA, PCR: NEGATIVE
Staphylococcus aureus: NEGATIVE

## 2024-02-20 SURGERY — FIXATION, FRACTURE, INTERTROCHANTERIC, WITH INTRAMEDULLARY ROD
Anesthesia: Spinal | Laterality: Left

## 2024-02-20 MED ORDER — DEXAMETHASONE SODIUM PHOSPHATE 10 MG/ML IJ SOLN
INTRAMUSCULAR | Status: DC | PRN
  Administered 2024-02-20: 5 mg via INTRAVENOUS

## 2024-02-20 MED ORDER — CHLORHEXIDINE GLUCONATE 0.12 % MT SOLN
OROMUCOSAL | Status: AC
  Filled 2024-02-20: qty 15

## 2024-02-20 MED ORDER — KETAMINE HCL 50 MG/5ML IJ SOSY
PREFILLED_SYRINGE | INTRAMUSCULAR | Status: DC | PRN
  Administered 2024-02-20: 20 mg via INTRAVENOUS

## 2024-02-20 MED ORDER — KETAMINE HCL 50 MG/5ML IJ SOSY
PREFILLED_SYRINGE | INTRAMUSCULAR | Status: AC
  Filled 2024-02-20: qty 5

## 2024-02-20 MED ORDER — ACETAMINOPHEN 10 MG/ML IV SOLN
INTRAVENOUS | Status: AC
Start: 2024-02-20 — End: 2024-02-20
  Filled 2024-02-20: qty 100

## 2024-02-20 MED ORDER — FENTANYL CITRATE (PF) 100 MCG/2ML IJ SOLN: 25.0000 ug | INTRAMUSCULAR | Status: DC | PRN

## 2024-02-20 MED ORDER — ONDANSETRON HCL 4 MG/2ML IJ SOLN
INTRAMUSCULAR | Status: DC | PRN
  Administered 2024-02-20: 4 mg via INTRAVENOUS

## 2024-02-20 MED ORDER — PROPOFOL 10 MG/ML IV BOLUS
INTRAVENOUS | Status: AC
Start: 2024-02-20 — End: 2024-02-20
  Filled 2024-02-20: qty 20

## 2024-02-20 MED ORDER — ACETAMINOPHEN 10 MG/ML IV SOLN
INTRAVENOUS | Status: DC | PRN
  Administered 2024-02-20: 1000 mg via INTRAVENOUS

## 2024-02-20 MED ORDER — ONDANSETRON HCL 4 MG/2ML IJ SOLN: 4.0000 mg | Freq: Once | INTRAMUSCULAR | Status: DC | PRN

## 2024-02-20 MED ORDER — ALBUMIN HUMAN 5 % IV SOLN
INTRAVENOUS | Status: AC
  Filled 2024-02-20: qty 250

## 2024-02-20 MED ORDER — METOCLOPRAMIDE HCL 5 MG/ML IJ SOLN: 5.0000 mg | Freq: Three times a day (TID) | INTRAMUSCULAR | Status: DC | PRN

## 2024-02-20 MED ORDER — PROPOFOL 500 MG/50ML IV EMUL
INTRAVENOUS | Status: DC | PRN
  Administered 2024-02-20: 30 ug/kg/min via INTRAVENOUS

## 2024-02-20 MED ORDER — PHENYLEPHRINE 80 MCG/ML (10ML) SYRINGE FOR IV PUSH (FOR BLOOD PRESSURE SUPPORT)
PREFILLED_SYRINGE | INTRAVENOUS | Status: DC | PRN
  Administered 2024-02-20 (×5): 160 ug via INTRAVENOUS

## 2024-02-20 MED ORDER — ALBUMIN HUMAN 5 % IV SOLN
12.5000 g | Freq: Once | INTRAVENOUS | Status: AC
  Administered 2024-02-20: 12.5 g via INTRAVENOUS
  Filled 2024-02-20: qty 250

## 2024-02-20 MED ORDER — CHLORHEXIDINE GLUCONATE 0.12 % MT SOLN
15.0000 mL | Freq: Once | OROMUCOSAL | Status: AC
  Administered 2024-02-20: 15 mL via OROMUCOSAL

## 2024-02-20 MED ORDER — PHENYLEPHRINE HCL-NACL 20-0.9 MG/250ML-% IV SOLN
INTRAVENOUS | Status: DC | PRN
  Administered 2024-02-20: 30 ug/min via INTRAVENOUS

## 2024-02-20 MED ORDER — ORAL CARE MOUTH RINSE: 15.0000 mL | Freq: Once | OROMUCOSAL | Status: AC

## 2024-02-20 MED ORDER — BUPIVACAINE IN DEXTROSE 0.75-8.25 % IT SOLN
INTRATHECAL | Status: DC | PRN
Start: 2024-02-20 — End: 2024-02-20
  Administered 2024-02-20: 1.6 mL via INTRATHECAL

## 2024-02-20 MED ORDER — METOCLOPRAMIDE HCL 5 MG PO TABS: 5.0000 mg | ORAL_TABLET | Freq: Three times a day (TID) | ORAL | Status: DC | PRN

## 2024-02-20 MED ORDER — ONDANSETRON HCL 4 MG/2ML IJ SOLN
INTRAMUSCULAR | Status: AC
  Filled 2024-02-20: qty 2

## 2024-02-20 MED ORDER — DOCUSATE SODIUM 100 MG PO CAPS
100.0000 mg | ORAL_CAPSULE | Freq: Two times a day (BID) | ORAL | Status: DC
  Administered 2024-02-20 – 2024-02-24 (×9): 100 mg via ORAL
  Filled 2024-02-20 (×9): qty 1

## 2024-02-20 MED ORDER — LIDOCAINE 2% (20 MG/ML) 5 ML SYRINGE
INTRAMUSCULAR | Status: AC
  Filled 2024-02-20: qty 5

## 2024-02-20 MED ORDER — DEXAMETHASONE SODIUM PHOSPHATE 10 MG/ML IJ SOLN
INTRAMUSCULAR | Status: AC
  Filled 2024-02-20: qty 1

## 2024-02-20 MED ORDER — ROCURONIUM BROMIDE 10 MG/ML (PF) SYRINGE
PREFILLED_SYRINGE | INTRAVENOUS | Status: AC
  Filled 2024-02-20: qty 10

## 2024-02-20 MED ORDER — PROPOFOL 10 MG/ML IV BOLUS
INTRAVENOUS | Status: DC | PRN
  Administered 2024-02-20: 40 mg via INTRAVENOUS

## 2024-02-20 MED ORDER — LACTATED RINGERS IV SOLN: INTRAVENOUS | Status: DC

## 2024-02-20 MED ORDER — CEFAZOLIN SODIUM 1 G IJ SOLR
INTRAMUSCULAR | Status: AC
  Filled 2024-02-20: qty 20

## 2024-02-20 MED ORDER — FENTANYL CITRATE (PF) 250 MCG/5ML IJ SOLN
INTRAMUSCULAR | Status: AC
  Filled 2024-02-20: qty 5

## 2024-02-20 MED ORDER — 0.9 % SODIUM CHLORIDE (POUR BTL) OPTIME
TOPICAL | Status: DC | PRN
  Administered 2024-02-20: 1000 mL

## 2024-02-20 MED ORDER — CEFAZOLIN SODIUM-DEXTROSE 2-4 GM/100ML-% IV SOLN
2.0000 g | Freq: Four times a day (QID) | INTRAVENOUS | Status: AC
  Administered 2024-02-20 (×2): 2 g via INTRAVENOUS
  Filled 2024-02-20 (×2): qty 100

## 2024-02-20 MED ORDER — PHENYLEPHRINE 80 MCG/ML (10ML) SYRINGE FOR IV PUSH (FOR BLOOD PRESSURE SUPPORT)
PREFILLED_SYRINGE | INTRAVENOUS | Status: AC
  Filled 2024-02-20: qty 10

## 2024-02-20 SURGICAL SUPPLY — 44 items
BAG COUNTER SPONGE SURGICOUNT (BAG) ×1 IMPLANT
BIT DRILL 4.0X280 (BIT) IMPLANT
BLADE SURG 10 STRL SS (BLADE) ×2 IMPLANT
BNDG COHESIVE 6X5 TAN ST LF (GAUZE/BANDAGES/DRESSINGS) ×2 IMPLANT
CHLORAPREP W/TINT 26 (MISCELLANEOUS) ×1 IMPLANT
COVER PERINEAL POST (MISCELLANEOUS) ×1 IMPLANT
COVER SURGICAL LIGHT HANDLE (MISCELLANEOUS) ×1 IMPLANT
DRAPE C-ARM 35X43 STRL (DRAPES) ×1 IMPLANT
DRAPE C-ARMOR (DRAPES) ×1 IMPLANT
DRAPE HALF SHEET 40X57 (DRAPES) ×2 IMPLANT
DRAPE IMP U-DRAPE 54X76 (DRAPES) ×2 IMPLANT
DRAPE INCISE IOBAN 66X45 STRL (DRAPES) ×1 IMPLANT
DRAPE STERI IOBAN 125X83 (DRAPES) ×1 IMPLANT
DRAPE SURG 17X23 STRL (DRAPES) ×1 IMPLANT
DRAPE U-SHAPE 47X51 STRL (DRAPES) ×1 IMPLANT
DRESSING MEPILEX FLEX 4X4 (GAUZE/BANDAGES/DRESSINGS) ×3 IMPLANT
DRSG MEPILEX POST OP 4X8 (GAUZE/BANDAGES/DRESSINGS) ×1 IMPLANT
DRSG TEGADERM 4X4.75 (GAUZE/BANDAGES/DRESSINGS) IMPLANT
ELECTRODE REM PT RTRN 9FT ADLT (ELECTROSURGICAL) ×1 IMPLANT
GAUZE SPONGE 4X4 12PLY STRL (GAUZE/BANDAGES/DRESSINGS) IMPLANT
GAUZE XEROFORM 5X9 LF (GAUZE/BANDAGES/DRESSINGS) IMPLANT
GLOVE BIO SURGEON STRL SZ7.5 (GLOVE) ×1 IMPLANT
GLOVE BIOGEL M STRL SZ7.5 (GLOVE) ×1 IMPLANT
GLOVE BIOGEL PI IND STRL 8 (GLOVE) ×2 IMPLANT
GOWN STRL REUS W/ TWL LRG LVL3 (GOWN DISPOSABLE) ×1 IMPLANT
GOWN STRL REUS W/ TWL XL LVL3 (GOWN DISPOSABLE) ×1 IMPLANT
GUIDEWIRE ORTH 900X3XBALL NOSE (WIRE) IMPLANT
KIT BASIN OR (CUSTOM PROCEDURE TRAY) ×1 IMPLANT
KIT TURNOVER KIT B (KITS) ×1 IMPLANT
NAIL LT 10X33X130 TROCHANTERIC (Nail) IMPLANT
NS IRRIG 1000ML POUR BTL (IV SOLUTION) ×1 IMPLANT
PACK GENERAL/GYN (CUSTOM PROCEDURE TRAY) ×1 IMPLANT
PAD ARMBOARD POSITIONER FOAM (MISCELLANEOUS) ×2 IMPLANT
PIN GUIDE THRD AR 3.2X330 (PIN) IMPLANT
SCREW CORT CAPTR 5X34 (Screw) IMPLANT
SCREW TELESCOP LAG LT 10.5X100 (Screw) IMPLANT
STAPLER SKIN PROX 35W (STAPLE) ×1 IMPLANT
SUT MON AB 3-0 SH27 (SUTURE) ×1 IMPLANT
SUT PDS AB 2-0 CT1 27 (SUTURE) ×1 IMPLANT
TAPE CLOTH 4X10 WHT NS (GAUZE/BANDAGES/DRESSINGS) IMPLANT
TOOL ACTIVATION (INSTRUMENTS) IMPLANT
TOWEL GREEN STERILE (TOWEL DISPOSABLE) ×2 IMPLANT
TOWEL GREEN STERILE FF (TOWEL DISPOSABLE) ×1 IMPLANT
WATER STERILE IRR 1000ML POUR (IV SOLUTION) ×1 IMPLANT

## 2024-02-20 NOTE — Op Note (Signed)
 Donna Daniels female 83 y.o. 02/20/2024  PreOperative Diagnosis: Left intertochanteric hip fracture  PostOperative Diagnosis: same  PROCEDURE: Cephalomedullary nail of left hip fracture  SURGEON: Lonni Pae, MD  ASSISTANT: None  ANESTHESIA: General endotracheal tube anesthesia  FINDINGS: Displaced intertrochanteric hip fracture  IMPLANTS: Arthrex intertrochanteric hip nail  INDICATIONS:83 y.o. femalehad a fall and sustained an intertrochanteric hip fracture.  She was indicated for surgery due to baseline ambulatory status and significant pain due to the fracture.  Discussion was had with the patient about the surgery and they wish to proceed.  Patient understood the risks, benefits and alternatives to surgery which include but are not limited to wound healing complications, infection, nonunion, malunion, need for further surgery as well as damage to surrounding structures. They also understood the potential for continued pain in that there were no guarantees of acceptable outcome After weighing these risks the patient opted to proceed with surgery.  PROCEDURE: Patient was identified in the preoperative holding area and the left hip was marked by myself.  The consent was signed by myself and the patient.  The patient was taken to the operating room where general endotracheal anesthesia was induced without difficulty and then was moved supine on a Hana table.  The operative leg was placed into the traction boot.  The non-operative leg was well-padded and affixed to the right leg holder with coban.  The arm was crossed over the chest and well padded.  Preoperative antibiotics was given.  Surgical timeout was performed.    Using fluoroscopy appropriate AP and lateral x-rays of the hip were obtained.  Then using traction through the Hana table was able to reduce the fracture fragments in a closed fashion.  Acceptable reduction was confirmed on AP and lateral x-rays.  Then the  left hip was prepped and draped in usual sterile fashion.  A shower curtain drape was placed.  We began the surgery by placing the guidepin percutaneously at the appropriate starting point at the tip of the greater trochanter.  The appropriate starting point was confirmed on AP and lateral radiograph.  Then a 3 cm incision was made about the site of the percutaneous placement of the pin and the opening reamer with a soft tissue guide was placed into the wound down to the tip of the greater trochanter and the bone was entered with the opening reamer down to the level of lesser trochanter.  Then the openning reamer and guide pin were removed and a 1short nail was placed.  The implant placed without difficulty and appropriate positioning was confirmed on fluoroscopy.  Then a stab incision was made for guidepin placement for the cephalomedullary screw.  Guidepin was placed in the appropriate position and the length was measured.  Care was taken to place the pin and the acceptable position within the femoral head.  Then cannulated drill was used to drill for the screw placement.  Screw was placed without difficulty.  The screw was then locked within the nail.  Appropriate position was confirmed fluoroscopically.  Then the distal interlock screw was placed without difficulty.  Final films were taken.  The wounds were irrigated with normal saline and the incisions were closed in a layered fashion using 2-0 monocryl and staples.  Mepilex dressings were used.  Patient was then transferred to the hospital bed and awakened from anesthesia without difficulty.  Counts were correct at the end the case.  There were no complications.  POST OPERATIVE INSTRUCTIONS: WBAT operative extremity Patient will be  discharged on 325 mg aspirin  for DVT prophylaxis if not already on DVT prophylaxis at baseline Follow-up in 2 weeks for x-rays of the operative hip and suture removal if appropriate    BLOOD LOSS:  200 mL          DRAINS: none         SPECIMEN: none       COMPLICATIONS:  * No complications entered in OR log *         Disposition: PACU - hemodynamically stable.         Condition: stable   plications entered in OR log *         Disposition: PACU - hemodynamically stable.         Condition: stable

## 2024-02-20 NOTE — Anesthesia Preprocedure Evaluation (Signed)
 Anesthesia Evaluation  Patient identified by MRN, date of birth, ID band Patient awake    Reviewed: Allergy & Precautions, NPO status , Patient's Chart, lab work & pertinent test results  History of Anesthesia Complications (+) PONV and history of anesthetic complications  Airway Mallampati: II  TM Distance: <3 FB Neck ROM: Limited    Dental  (+) Teeth Intact, Dental Advisory Given   Pulmonary asthma    Pulmonary exam normal breath sounds clear to auscultation       Cardiovascular negative cardio ROS Normal cardiovascular exam Rhythm:Regular Rate:Normal     Neuro/Psych S/p ACDF    GI/Hepatic negative GI ROS, Neg liver ROS,,,  Endo/Other  Hypothyroidism    Renal/GU negative Renal ROS     Musculoskeletal  (+) Arthritis ,   LEFT HIP FRACTURE   Abdominal   Peds  Hematology negative hematology ROS (+) Plt 232k   Anesthesia Other Findings Day of surgery medications reviewed with the patient.  Reproductive/Obstetrics                              Anesthesia Physical Anesthesia Plan  ASA: 3  Anesthesia Plan: Spinal   Post-op Pain Management: Toradol  IV (intra-op)* and Ofirmev  IV (intra-op)*   Induction: Intravenous  PONV Risk Score and Plan: 3 and TIVA, Dexamethasone  and Ondansetron   Airway Management Planned: Natural Airway and Simple Face Mask  Additional Equipment:   Intra-op Plan:   Post-operative Plan:   Informed Consent: I have reviewed the patients History and Physical, chart, labs and discussed the procedure including the risks, benefits and alternatives for the proposed anesthesia with the patient or authorized representative who has indicated his/her understanding and acceptance.     Dental advisory given  Plan Discussed with: CRNA  Anesthesia Plan Comments:         Anesthesia Quick Evaluation

## 2024-02-20 NOTE — Addendum Note (Signed)
 Addendum  created 02/20/24 2037 by Corinne Garnette BRAVO, MD   Clinical Note Signed, Intraprocedure Blocks edited, SmartForm saved

## 2024-02-20 NOTE — Anesthesia Postprocedure Evaluation (Signed)
 Anesthesia Post Note  Patient: Donna Daniels  Procedure(s) Performed: FIXATION, FRACTURE, INTERTROCHANTERIC, WITH INTRAMEDULLARY ROD (Left)     Patient location during evaluation: PACU Anesthesia Type: Spinal Level of consciousness: awake, awake and alert and oriented Pain management: pain level controlled Vital Signs Assessment: post-procedure vital signs reviewed and stable Respiratory status: spontaneous breathing, nonlabored ventilation and respiratory function stable Cardiovascular status: blood pressure returned to baseline and stable Postop Assessment: no headache, no backache, spinal receding and no apparent nausea or vomiting Anesthetic complications: no   No notable events documented.  Last Vitals:  Vitals:   02/20/24 1038 02/20/24 1147  BP:  130/65  Pulse: 85 81  Resp: 17 18  Temp: 36.6 C 36.7 C  SpO2: 97% 99%    Last Pain:  Vitals:   02/20/24 1147  TempSrc: Oral  PainSc:                  [name]

## 2024-02-20 NOTE — Anesthesia Procedure Notes (Addendum)
 Spinal  Patient location during procedure: OR Start time: 02/20/2024 8:50 AM End time: 02/20/2024 8:54 AM Reason for block: surgical anesthesia Staffing Performed: anesthesiologist  Anesthesiologist: Corinne Garnette BRAVO, MD Performed by: Corinne Garnette BRAVO, MD Authorized by: Corinne Garnette BRAVO, MD   Preanesthetic Checklist Completed: patient identified, IV checked, risks and benefits discussed, surgical consent, monitors and equipment checked, pre-op evaluation and timeout performed Spinal Block Patient position: left lateral decubitus Prep: DuraPrep and site prepped and draped Patient monitoring: continuous pulse ox and blood pressure Approach: midline Location: L3-4 Injection technique: single-shot Needle Needle type: Pencan  Needle gauge: 24 G Assessment Events: CSF return and second provider Additional Notes Functioning IV was confirmed and monitors were applied. Sterile prep and drape, including hand hygiene, mask and sterile gloves were used. The patient was positioned and the spine was prepped. The skin was anesthetized with lidocaine .  Free flow of clear CSF was obtained prior to injecting local anesthetic into the CSF.  The spinal needle aspirated freely following injection.  The needle was carefully withdrawn.  The patient tolerated the procedure well. Consent was obtained prior to procedure with all questions answered and concerns addressed. Risks including but not limited to bleeding, infection, nerve damage, paralysis, failed block, inadequate analgesia, allergic reaction, high spinal, itching and headache were discussed and the patient wished to proceed.   Garnette Corinne, MD

## 2024-02-20 NOTE — Plan of Care (Signed)
 ?  Problem: Clinical Measurements: ?Goal: Ability to maintain clinical measurements within normal limits will improve ?Outcome: Progressing ?Goal: Will remain free from infection ?Outcome: Progressing ?Goal: Diagnostic test results will improve ?Outcome: Progressing ?  ?

## 2024-02-20 NOTE — Consult Note (Signed)
 Reason for Consult: Left intertrochanteric hip fracture Referring Physician: Emergency department  Donna Daniels is an 83 y.o. female.  HPI: Patient was watering her flowers and had a fall.  She was brought to the emergency department due to inability to walk afterwards and x-rays revealed an intertrochanteric hip fracture on the left.  She was admitted to the hospitalist team and orthopedics was consulted.  She was seen in the preoperative area.  Complains of pain in the left hip primarily.  No other areas of concern.  Prior to that she was having some hip pain on that side.  Past Medical History:  Diagnosis Date   Arthritis    Bruises easily    Cataracts, bilateral    High cholesterol    takes SImvastin  daily   History of bronchitis    last time about 39yrs ago   Hypothyroidism    takes Synthroid  daily   Joint pain    Joint swelling    PONV (postoperative nausea and vomiting)    Sciatica    Thyroid  disorder     Past Surgical History:  Procedure Laterality Date   ABDOMINAL HYSTERECTOMY     ANTERIOR CERVICAL DECOMP/DISCECTOMY FUSION  12/18/2011   Procedure: ANTERIOR CERVICAL DECOMPRESSION/DISCECTOMY FUSION 2 LEVELS;  Surgeon: Darina MALVA Boehringer, MD;  Location: [facility];  Service: Neurosurgery;  Laterality: N/A;  Anterior cervical decompression fusion four-six   CESAREAN SECTION     x 1    COLONOSCOPY     HAND SURGERY Left    THYROIDECTOMY     TOTAL KNEE ARTHROPLASTY Right 04/18/2013   Procedure: TOTAL KNEE ARTHROPLASTY;  Surgeon: Maude KANDICE Herald, MD;  Location: [facility];  Service: Orthopedics;  Laterality: Right;   TOTAL KNEE ARTHROPLASTY Left 07/25/2013   Procedure: TOTAL KNEE ARTHROPLASTY;  Surgeon: Maude KANDICE Herald, MD;  Location: [facility];  Service: Orthopedics;  Laterality: Left;   VESICOVAGINAL FISTULA CLOSURE W/ TAH      Family History  Problem Relation Age of Onset   Breast cancer Neg Hx     Social History:  reports that she has never smoked. She has never used  smokeless tobacco. She reports that she does not drink alcohol and does not use drugs.  Allergies: No Known Allergies  Medications: I have reviewed the patient's current medications.  Results for orders placed or performed during the hospital encounter of 02/19/24 (from the past 48 hours)  Comprehensive metabolic panel     Status: Abnormal   Collection Time: 02/19/24  3:41 PM  Result Value Ref Range   Sodium 139 135 - 145 mmol/L   Potassium 3.8 3.5 - 5.1 mmol/L   Chloride 101 98 - 111 mmol/L   CO2 25 22 - 32 mmol/L   Glucose, Bld 131 (H) 70 - 99 mg/dL    Comment: Glucose reference range applies only to samples taken after fasting for at least 8 hours.   BUN 16 8 - 23 mg/dL   Creatinine, Ser 9.04 0.44 - 1.00 mg/dL   Calcium  9.2 8.9 - 10.3 mg/dL   Total Protein 6.7 6.5 - 8.1 g/dL   Albumin  3.7 3.5 - 5.0 g/dL   AST 22 15 - 41 U/L   ALT 15 0 - 44 U/L   Alkaline Phosphatase 55 38 - 126 U/L   Total Bilirubin 0.9 0.0 - 1.2 mg/dL   GFR, Estimated 59 (L) >60 mL/min    Comment: (NOTE) Calculated using the CKD-EPI Creatinine Equation (2021)    Anion  gap 13 5 - 15    Comment: Performed at [facility], [address] [place], [place] [address]  CBC with Differential     Status: None   Collection Time: 02/19/24  3:41 PM  Result Value Ref Range   WBC 9.0 4.0 - 10.5 K/uL   RBC 4.17 3.87 - 5.11 MIL/uL   Hemoglobin 12.4 12.0 - 15.0 g/dL   HCT 61.6 63.9 - 53.9 %   MCV 91.8 80.0 - 100.0 fL   MCH 29.7 26.0 - 34.0 pg   MCHC 32.4 30.0 - 36.0 g/dL   RDW 86.5 88.4 - 84.4 %   Platelets 232 150 - 400 K/uL   nRBC 0.0 0.0 - 0.2 %   Neutrophils Relative % 63 %   Neutro Abs 5.7 1.7 - 7.7 K/uL   Lymphocytes Relative 26 %   Lymphs Abs 2.3 0.7 - 4.0 K/uL   Monocytes Relative 9 %   Monocytes Absolute 0.8 0.1 - 1.0 K/uL   Eosinophils Relative 1 %   Eosinophils Absolute 0.1 0.0 - 0.5 K/uL   Basophils Relative 1 %   Basophils Absolute 0.1 0.0 - 0.1 K/uL   Immature Granulocytes 0 %   Abs Immature  Granulocytes 0.03 0.00 - 0.07 K/uL    Comment: Performed at [facility], [address], [place], [place] [address]  Surgical pcr screen     Status: None   Collection Time: 02/20/24  5:15 AM   Specimen: Nasal Mucosa; Nasal Swab  Result Value Ref Range   MRSA, PCR NEGATIVE NEGATIVE   Staphylococcus aureus NEGATIVE NEGATIVE    Comment: (NOTE) The Xpert SA Assay (FDA approved for NASAL specimens in patients 59 years of age and older), is one component of a comprehensive surveillance program. It is not intended to diagnose infection nor to guide or monitor treatment. Performed at [facility], [address], [place], [place] [address]     DG Chest Port 1 View Result Date: 02/19/2024 CLINICAL DATA:  Preop chest radiograph. EXAM: PORTABLE CHEST 1 VIEW COMPARISON:  Chest radiograph dated 04/13/2013 FINDINGS: No focal consolidation, pleural effusion or pneumothorax. The cardiac silhouette is within normal limits. No acute osseous pathology. IMPRESSION: No active disease. Electronically Signed   By: Vanetta Chou M.D.   On: 02/19/2024 16:22   DG Hip Unilat With Pelvis 2-3 Views Left Result Date: 02/19/2024 CLINICAL DATA:  Pain after a fall onto the left hip. EXAM: DG HIP (WITH OR WITHOUT PELVIS) 2-3V LEFT COMPARISON:  None Available. FINDINGS: Comminuted inter trochanteric fractures of the left proximal femur with varus angulation of the fracture fragments and displacement of the lesser trochanteric fragment. No dislocation at the hip joint. Degenerative changes in the lower lumbar spine and in both hips. Pelvis appears intact. SI joints and symphysis pubis are not displaced. Soft tissues are unremarkable. IMPRESSION: Acute comminuted inter trochanteric fracture of the left proximal femur with varus angulation. Electronically Signed   By: Elsie Gravely M.D.   On: 02/19/2024 16:21    Review of Systems  Constitutional: Negative.   HENT: Negative.    Respiratory: Negative.     Cardiovascular: Negative.   Gastrointestinal: Negative.   Genitourinary: Negative.   Musculoskeletal:        Left hip pain  Skin: Negative.   Neurological: Negative.   Psychiatric/Behavioral: Negative.     Blood pressure 124/66, pulse 82, temperature 98.3 F (36.8 C), temperature source Oral, resp. rate 16, height 5' 6 (1.676 m), weight 76.9 kg,  SpO2 92%. Physical Exam HENT:     Head: Atraumatic.     Mouth/Throat:     Mouth: Mucous membranes are moist.  Eyes:     Extraocular Movements: Extraocular movements intact.  Cardiovascular:     Rate and Rhythm: Normal rate.     Pulses: Normal pulses.  Pulmonary:     Effort: Pulmonary effort is normal.  Abdominal:     Palpations: Abdomen is soft.  Musculoskeletal:     Cervical back: Neck supple.     Comments: Tender to palpation of the left hip.  No skin lacerations.  No tenderness distal along the thigh, knee, leg or ankle.  She is able to wiggle toes bilaterally.  No evidence of bilateral upper extremity or right lower extremity injury.  Skin:    General: Skin is warm.  Neurological:     General: No focal deficit present.     Mental Status: She is alert.  Psychiatric:        Mood and Affect: Mood normal.     Assessment/Plan: Left intertrochanteric hip fracture  We will plan for cephalomedullary nail of her fracture.  We discussed the risk, benefits and alternatives to surgery which include but are not limited to wound healing complications, infection, nonunion, malunion, need for further surgery and damage to surrounding structures.  After weighing these risks he opted to proceed with surgery.    Postoperatively she will be evaluated by physical therapy for disposition purposes.  Donna Daniels 02/20/2024, 8:06 AM

## 2024-02-20 NOTE — Progress Notes (Signed)
 PROGRESS NOTE Donna Daniels  FMW:[id number] DOB: 1940-07-07 DOA: 02/19/2024 PCP: Marvine Rush, MD  Brief Narrative/Hospital Course:  83 y.o. female with medical history significant of hypothyroidism, hyperlipidemia, history of bilateral knee replacement.  Patient had a fall earlier today while in the garden.  She became off balance and fell on her right hip, having immediate pain in the left hip and was unable to move it.  A neighbor saw her and came to her aid.  EMS was called and she was transported to the hospital.  No fevers, chills, nausea, vomiting.  Denies chest pain, shortness of breath.  She is normally able to ambulate without too much difficulty.  No chest pain or shortness of breath with activity.   Subjective: Seen and examined today Resting well No complaints Patient is back from surgery brief hypotension in 70s- 80s in PACU currently on a 101  Assessment and plan:  Closed left hip fracture : Patient underwent FIXATION, FRACTURE, INTERTROCHANTERIC, WITH INTRAMEDULLARY ROD. Continue pain management, DVT prophylaxis as per orthopedics PT OT as able  Brief hypotension perioperatively: Continue IV fluids, monitor hemoglobin at home not on antihypertensive  Hyperlipidemia Continue statin  Hypothyroidism Continue levothyroxine  Mobility: PT Orders:  PT Follow up Rec:    DVT prophylaxis: SCDs Start: 02/20/24 1052 SCDs Start: 02/19/24 2001 Code Status:   Code Status: Full Code Family Communication: plan of care discussed with patient at bedside. Patient status is: Remains hospitalized because of severity of illness Level of care: Med-Surg   Dispo: The patient is from: home            Anticipated disposition: TBD Objective: Vitals last 24 hrs: Vitals:   02/20/24 1015 02/20/24 1030 02/20/24 1038 02/20/24 1147  BP: (!) 109/58 (!) 101/58  130/65  Pulse: 82 82 85 81  Resp: 15 13 17 18   Temp:   97.8 F (36.6 C) 98 F (36.7 C)  TempSrc:    Oral  SpO2: 97% 96% 97%  99%  Weight:      Height:        Physi General exam: alert awake, oriented, older than stated age HEENT:Oral mucosa moist, Ear/Nose WNL grossly Respiratory system: Bilaterally clear BS,no use of accessory muscle Cardiovascular system: S1 & S2 +, No JVD. Gastrointestinal system: Abdomen soft,NT,ND, BS+ Nervous System: Alert, awake, moving all extremities,and following commands. Extremities: LE edema neg, distal extremities warm. Left hip surgical site dressing c/d/i Skin: No rashes,no icterus. MSK: Normal muscle bulk,tone, power   Medications reviewed:  Scheduled Meds:  calcium -vitamin D  1 tablet Oral Daily   docusate sodium   100 mg Oral BID   levothyroxine   88 mcg Oral Q0600   pantoprazole   40 mg Oral Daily   rosuvastatin   20 mg Oral QHS   Continuous Infusions:   ceFAZolin  (ANCEF ) IV 2 g (02/20/24 1144)   lactated ringers  75 mL/hr at 02/20/24 0841   Diet: Diet Order             Diet regular Room service appropriate? Yes; Fluid consistency: Thin  Diet effective now                   Data Reviewed: I have personally reviewed following labs and imaging studies ( see epic result tab) CBC: Recent Labs  Lab 02/19/24 1541  WBC 9.0  NEUTROABS 5.7  HGB 12.4  HCT 38.3  MCV 91.8  PLT 232   CMP: Recent Labs  Lab 02/19/24 1541  NA 139  K 3.8  CL  101  CO2 25  GLUCOSE 131*  BUN 16  CREATININE 0.95  CALCIUM  9.2   GFR: Estimated Creatinine Clearance: 47 mL/min (by C-G formula based on SCr of 0.95 mg/dL). Recent Labs  Lab 02/19/24 1541  AST 22  ALT 15  ALKPHOS 55  BILITOT 0.9  PROT 6.7  ALBUMIN  3.7   No results for input(s): LIPASE, AMYLASE in the last 168 hours. No results for input(s): AMMONIA in the last 168 hours. Coagulation Profile: No results for input(s): INR, PROTIME in the last 168 hours. Unresulted Labs (From admission, onward)    None      Antimicrobials/Microbiology: Anti-infectives (From admission, onward)    Start      Dose/Rate Route Frequency Ordered Stop   02/20/24 1145  ceFAZolin  (ANCEF ) IVPB 2g/100 mL premix        2 g 200 mL/hr over 30 Minutes Intravenous Every 6 hours 02/20/24 1051 02/20/24 2344   02/20/24 0600  ceFAZolin  (ANCEF ) IVPB 2g/100 mL premix        2 g 200 mL/hr over 30 Minutes Intravenous On call to O.R. 02/19/24 2009 02/20/24 0903      No results found for: SDES, SPECREQUEST, CULT, REPTSTATUS  Procedures: Procedure(s) (LRB): FIXATION, FRACTURE, INTERTROCHANTERIC, WITH INTRAMEDULLARY ROD (Left)   Mennie LAMY, MD [facility] 02/20/2024, 12:36 PM

## 2024-02-20 NOTE — Transfer of Care (Signed)
 Immediate Anesthesia Transfer of Care Note  Patient: Donna Daniels  Procedure(s) Performed: FIXATION, FRACTURE, INTERTROCHANTERIC, WITH INTRAMEDULLARY ROD (Left)  Patient Location: PACU  Anesthesia Type:MAC and Spinal  Level of Consciousness: awake and alert   Airway & Oxygen Therapy: Patient Spontanous Breathing and Patient connected to face mask oxygen  Post-op Assessment: Report given to RN and Post -op Vital signs reviewed and stable  Post vital signs: Reviewed and stable  Last Vitals:  Vitals Value Taken Time  BP 81/49 02/20/24 09:52  Temp    Pulse 86 02/20/24 09:56  Resp 17 02/20/24 09:56  SpO2 92 % 02/20/24 09:56  Vitals shown include unfiled device data.  Last Pain:  Vitals:   02/20/24 0756  TempSrc: Oral  PainSc:          Complications: No notable events documented.

## 2024-02-20 NOTE — Hospital Course (Addendum)
 83 y.o. female with medical history significant of hypothyroidism, hyperlipidemia, history of bilateral knee replacement.  Patient had a fall earlier today while in the garden.  She became off balance and fell on her right hip, having immediate pain in the left hip and was unable to move it.  A neighbor saw her and came to her aid.  EMS was called and she was transported to the hospital.  No fevers, chills, nausea, vomiting.  Denies chest pain, shortness of breath.  She is normally able to ambulate without too much difficulty.  No chest pain or shortness of breath with activity.   Subjective: Seen and examined About to work with PT OT Complains of soreness on the left hip Labs reviewed hemoglobin drifting 12.4> 9.4>8.8  Assessment and plan:  Closed left hip fracture : Patient underwent FIXATION, FRACTURE, INTERTROCHANTERIC, WITH INTRAMEDULLARY ROD. Continue pain management, DVT prophylaxis as per orthopedics PT OT as able-[facility] skilled nursing facility. Orthopedic recommending aspirin  325 on discharge and follow-up in 2 weeks. Discussed with Dr. Elsa this morning-holding off on DVT prophylaxis chemical at this time and reassess with hemoglobin check in a.m. Add Oxy for pain medicine and scheduled Tylenol , continue muscle reduction  ABLA: Suspect in the setting of hip fracture/surgery hemoglobin down to 9.4 will monitor add iron supplement Recent Labs  Lab 02/19/24 1541 02/21/24 0627 02/22/24 0601  HGB 12.4 9.4* 8.8*  HCT 38.3 28.3* 26.4*    Brief hypotension perioperatively: BP stable  Hyperlipidemia Continue statin  Hypothyroidism Continue levothyroxine   Mobility: PT Orders: Active  PT Follow up Rec: Skilled Nursing-Short Term Rehab (<3 Hours/Day)02/21/2024 [phone]    DVT prophylaxis: SCDs Start: 02/20/24 1052 SCDs Start: 02/19/24 2001 Code Status:   Code Status: Full Code Family Communication: plan of care discussed with patient at bedside. Patient status is: Remains  hospitalized because of severity of illness Level of care: Med-Surg   Dispo: The patient is from: home            Anticipated disposition: SNF Objective: Vitals last 24 hrs: Vitals:   02/21/24 1637 02/21/24 2029 02/22/24 0419 02/22/24 0834  BP: 129/71 (!) 128/53 129/75 (!) 123/59  Pulse: (!) 102 69 93 62  Resp: 16 16 20 17   Temp: 97.6 F (36.4 C) 98.3 F (36.8 C) 97.9 F (36.6 C) 98.2 F (36.8 C)  TempSrc: Oral Oral Oral   SpO2: 94% 95% 93% 95%  Weight:      Height:        Physi General exam: AAOX3 HEENT:Oral mucosa moist, Ear/Nose WNL grossly Respiratory system: Bilaterally clear BS,no use of accessory muscle Cardiovascular system: S1 & S2 +, No JVD. Gastrointestinal system: Abdomen soft,NT,ND, BS+ Nervous System: Alert, awake, moving all extremities,and following commands. Extremities: No leg edema, left hip with Aquacel dressing in place clean dry intact Skin: No rashes,no icterus. MSK: Normal muscle bulk,tone, power   Medications reviewed:  Scheduled Meds:  calcium -vitamin D  1 tablet Oral Daily   docusate sodium   100 mg Oral BID   ferrous sulfate   325 mg Oral BID WC   levothyroxine   88 mcg Oral Q0600   pantoprazole   40 mg Oral Daily   rosuvastatin   20 mg Oral QHS   Continuous Infusions:   Diet: Diet Order             Diet regular Room service appropriate? Yes; Fluid consistency: Thin  Diet effective now

## 2024-02-21 DIAGNOSIS — S72002A Fracture of unspecified part of neck of left femur, initial encounter for closed fracture: Secondary | ICD-10-CM | POA: Diagnosis not present

## 2024-02-21 LAB — BASIC METABOLIC PANEL WITH GFR
Anion gap: 7 (ref 5–15)
BUN: 14 mg/dL (ref 8–23)
CO2: 27 mmol/L (ref 22–32)
Calcium: 9.1 mg/dL (ref 8.9–10.3)
Chloride: 101 mmol/L (ref 98–111)
Creatinine, Ser: 0.92 mg/dL (ref 0.44–1.00)
GFR, Estimated: >60 mL/min (ref >60)
Glucose, Bld: 140 mg/dL — ABNORMAL HIGH (ref 70–99)
Potassium: 4.5 mmol/L (ref 3.5–5.1)
Sodium: 135 mmol/L (ref 135–145)

## 2024-02-21 LAB — CBC
HCT: 28.3 % — ABNORMAL LOW (ref 36.0–46.0)
Hemoglobin: 9.4 g/dL — ABNORMAL LOW (ref 12.0–15.0)
MCH: 30.3 pg (ref 26.0–34.0)
MCHC: 33.2 g/dL (ref 30.0–36.0)
MCV: 91.3 fL (ref 80.0–100.0)
Platelets: 190 K/uL (ref 150–400)
RBC: 3.1 MIL/uL — ABNORMAL LOW (ref 3.87–5.11)
RDW: 13.5 % (ref 11.5–15.5)
WBC: 13.5 K/uL — ABNORMAL HIGH (ref 4.0–10.5)
nRBC: 0 % (ref 0.0–0.2)

## 2024-02-21 MED ORDER — FERROUS SULFATE 325 (65 FE) MG PO TABS
325.0000 mg | ORAL_TABLET | Freq: Two times a day (BID) | ORAL | Status: DC
  Administered 2024-02-21 – 2024-02-24 (×6): 325 mg via ORAL
  Filled 2024-02-21 (×6): qty 1

## 2024-02-21 MED ORDER — MUSCLE RUB 10-15 % EX CREA
TOPICAL_CREAM | CUTANEOUS | Status: DC | PRN
  Filled 2024-02-21: qty 85

## 2024-02-21 NOTE — Progress Notes (Signed)
 PROGRESS NOTE Donna Daniels  FMW:[id number] DOB: 01/23/1941 DOA: 02/19/2024 PCP: Marvine Rush, MD  Brief Narrative/Hospital Course:  83 y.o. female with medical history significant of hypothyroidism, hyperlipidemia, history of bilateral knee replacement.  Patient had a fall earlier today while in the garden.  She became off balance and fell on her right hip, having immediate pain in the left hip and was unable to move it.  A neighbor saw her and came to her aid.  EMS was called and she was transported to the hospital.  No fevers, chills, nausea, vomiting.  Denies chest pain, shortness of breath.  She is normally able to ambulate without too much difficulty.  No chest pain or shortness of breath with activity.   Subjective: Seen and examined today Daughter at the bedside Complains of soreness and spasm Resting in the bedside chair Overnight afebrile BP stable hemoglobin downtrending at 9.4  Assessment and plan:  Closed left hip fracture : Patient underwent FIXATION, FRACTURE, INTERTROCHANTERIC, WITH INTRAMEDULLARY ROD. Continue pain management, DVT prophylaxis as per orthopedics PT OT as able  ABLA: Suspect in the setting of hip fracture/surgery hemoglobin down to 9.4 will monitor add iron supplement Recent Labs  Lab 02/19/24 1541 02/21/24 0627  HGB 12.4 9.4*  HCT 38.3 28.3*    Brief hypotension perioperatively: BP stable  Hyperlipidemia Continue statin  Hypothyroidism Continue levothyroxine   Mobility: PT Orders: Active  PT Follow up Rec: Skilled Nursing-Short Term Rehab (<3 Hours/Day)02/21/2024 [phone]    DVT prophylaxis: SCDs Start: 02/20/24 1052 SCDs Start: 02/19/24 2001 Code Status:   Code Status: Full Code Family Communication: plan of care discussed with patient at bedside. Patient status is: Remains hospitalized because of severity of illness Level of care: Med-Surg   Dispo: The patient is from: home            Anticipated disposition: SNF Objective: Vitals  last 24 hrs: Vitals:   02/20/24 2020 02/20/24 2030 02/21/24 0411 02/21/24 0918  BP:  121/68 (!) 111/59 124/61  Pulse:  87 87 88  Resp:  18 18 17   Temp:  97.6 F (36.4 C) 97.6 F (36.4 C) 98.4 F (36.9 C)  TempSrc:  Oral Oral   SpO2: 90% 95% 96% 97%  Weight:      Height:        Physi General exam: alert awake, oriented HEENT:Oral mucosa moist, Ear/Nose WNL grossly Respiratory system: Bilaterally clear BS,no use of accessory muscle Cardiovascular system: S1 & S2 +, No JVD. Gastrointestinal system: Abdomen soft,NT,ND, BS+ Nervous System: Alert, awake, moving all extremities,and following commands. Extremities: LE edema neg, distal extremities warm.  Left hip surgical site dressing present C/D/I  Skin: No rashes,no icterus. MSK: Normal muscle bulk,tone, power   Medications reviewed:  Scheduled Meds:  calcium -vitamin D  1 tablet Oral Daily   docusate sodium   100 mg Oral BID   ferrous sulfate   325 mg Oral BID WC   levothyroxine   88 mcg Oral Q0600   pantoprazole   40 mg Oral Daily   rosuvastatin   20 mg Oral QHS   Continuous Infusions:   Diet: Diet Order             Diet regular Room service appropriate? Yes; Fluid consistency: Thin  Diet effective now                   Data Reviewed: I have personally reviewed following labs and imaging studies ( see epic result tab) CBC: Recent Labs  Lab 02/19/24 1541 02/21/24 9372  WBC 9.0 13.5*  NEUTROABS 5.7  --   HGB 12.4 9.4*  HCT 38.3 28.3*  MCV 91.8 91.3  PLT 232 190   CMP: Recent Labs  Lab 02/19/24 1541 02/21/24 0627  NA 139 135  K 3.8 4.5  CL 101 101  CO2 25 27  GLUCOSE 131* 140*  BUN 16 14  CREATININE 0.95 0.92  CALCIUM  9.2 9.1   GFR: Estimated Creatinine Clearance: 48.5 mL/min (by C-G formula based on SCr of 0.92 mg/dL). Recent Labs  Lab 02/19/24 1541  AST 22  ALT 15  ALKPHOS 55  BILITOT 0.9  PROT 6.7  ALBUMIN  3.7   No results for input(s): LIPASE, AMYLASE in the last 168 hours. No results  for input(s): AMMONIA in the last 168 hours. Coagulation Profile: No results for input(s): INR, PROTIME in the last 168 hours. Unresulted Labs (From admission, onward)     Start     Ordered   02/21/24 0500  Basic metabolic panel with GFR  Daily,   R      02/20/24 1613   02/21/24 0500  CBC  Daily,   R      02/20/24 1613           Antimicrobials/Microbiology: Anti-infectives (From admission, onward)    Start     Dose/Rate Route Frequency Ordered Stop   02/20/24 1145  ceFAZolin  (ANCEF ) IVPB 2g/100 mL premix        2 g 200 mL/hr over 30 Minutes Intravenous Every 6 hours 02/20/24 1051 02/20/24 1658   02/20/24 0600  ceFAZolin  (ANCEF ) IVPB 2g/100 mL premix        2 g 200 mL/hr over 30 Minutes Intravenous On call to O.R. 02/19/24 2009 02/20/24 0903      No results found for: SDES, SPECREQUEST, CULT, REPTSTATUS  Procedures: Procedure(s) (LRB): FIXATION, FRACTURE, INTERTROCHANTERIC, WITH INTRAMEDULLARY ROD (Left)   Mennie LAMY, MD [facility] 02/21/2024, 11:40 AM

## 2024-02-21 NOTE — Evaluation (Signed)
 Occupational Therapy Evaluation Patient Details Name: Donna Daniels MRN: [id number] DOB: 22-Apr-1941 Today's Date: 02/21/2024   History of Present Illness   Pt is an 83 y/o female who presents 02/19/2024 s/p fall in garden, sustaining a L hip fracture. She is now s/p L cephalomedullary nailing on 02/20/2024. PMH significant for Hypothyroidism, joint pain/swelling, ACDF 2013, L hand surgery, thyroidectomy, B TKA.     Clinical Impressions Donna Daniels was evaluated s/p the above admission list. She lives alone and is indep at baseline. Upon evaluation the pt was limited by pain, generalized weakness, unsteady balance and decreased activity tolerance. Overall she needed mod A +2 to stand and progressed to min A +2 for pivotal stepping with RW. Pt had a continent urinary episode and needed total A for peri care in standing. Due to the deficits listed below the pt also needs up to max A for LB ADLs and set up for UB ADLs in sitting. Pt will benefit from continued acute OT services and skilled inpatient follow up therapy, <3 hours/day - however, due to pts indep baseline she has the potential to flip to home with increased therapy frequencies acutely.       If plan is discharge home, recommend the following:   A lot of help with walking and/or transfers;A lot of help with bathing/dressing/bathroom;Assistance with cooking/housework;Direct supervision/assist for medications management;Direct supervision/assist for financial management;Assist for transportation;Help with stairs or ramp for entrance     Functional Status Assessment   Patient has had a recent decline in their functional status and demonstrates the ability to make significant improvements in function in a reasonable and predictable amount of time.     Equipment Recommendations   Other (comment) (pending acute progress)      Precautions/Restrictions   Precautions Precautions: Fall Recall of Precautions/Restrictions:  Intact Restrictions Weight Bearing Restrictions Per Provider Order: Yes LLE Weight Bearing Per Provider Order: Weight bearing as tolerated     Mobility Bed Mobility Overal bed mobility: Needs Assistance             General bed mobility comments: pt on the EOB on arrival    Transfers Overall transfer level: Needs assistance Equipment used: Rolling walker (2 wheels), 2 person hand held assist Transfers: Sit to/from Stand, Bed to chair/wheelchair/BSC Sit to Stand: Mod assist, +2 physical assistance, Max assist Stand pivot transfers: Mod assist, +2 physical assistance   Step pivot transfers: Min assist, +2 physical assistance     General transfer comment: mod A +2 for STS, once standing pt is able to progress to min A +2      Balance Overall balance assessment: Needs assistance Sitting-balance support: Feet supported, No upper extremity supported Sitting balance-Leahy Scale: Fair     Standing balance support: Bilateral upper extremity supported, During functional activity, Reliant on assistive device for balance Standing balance-Leahy Scale: Poor                             ADL either performed or assessed with clinical judgement   ADL Overall ADL's : Needs assistance/impaired Eating/Feeding: Independent   Grooming: Set up;Sitting   Upper Body Bathing: Set up;Sitting   Lower Body Bathing: Maximal assistance;Sit to/from stand   Upper Body Dressing : Set up;Sitting   Lower Body Dressing: Maximal assistance;Sit to/from stand   Toilet Transfer: Moderate assistance;+2 for physical assistance;+2 for safety/equipment;BSC/3in1;Rolling walker (2 wheels);Stand-pivot   Toileting- Architect and Hygiene: Total assistance Toileting - Clothing Manipulation Details (  indicate cue type and reason): peri hygiene in standing     Functional mobility during ADLs: Moderate assistance;+2 for physical assistance;+2 for safety/equipment General ADL Comments:  limited by LLE pain, weakness, poor activiyt toleance and balance     Vision Baseline Vision/History: 1 Wears glasses Vision Assessment?: No apparent visual deficits     Perception Perception: Not tested       Praxis Praxis: Not tested       Pertinent Vitals/Pain Pain Assessment Pain Assessment: 0-10 Pain Score: 1  Pain Location: L hip Pain Descriptors / Indicators: Operative site guarding, Sore Pain Intervention(s): Monitored during session, Limited activity within patient's tolerance     Extremity/Trunk Assessment Upper Extremity Assessment Upper Extremity Assessment: Generalized weakness   Lower Extremity Assessment Lower Extremity Assessment: Defer to PT evaluation LLE Deficits / Details: Acute pain, decreased strength and AROM consistent with pre-op diagnosis and subsequent surgery.   Cervical / Trunk Assessment Cervical / Trunk Assessment: Other exceptions Cervical / Trunk Exceptions: Forward head posture with rounded shoulders   Communication Communication Communication: Impaired Factors Affecting Communication: Hearing impaired   Cognition Arousal: Alert Behavior During Therapy: WFL for tasks assessed/performed Cognition: No apparent impairments                               Following commands: Intact       Cueing  General Comments   Cueing Techniques: Verbal cues;Gestural cues      Exercises     Shoulder Instructions      Home Living Family/patient expects to be discharged to:: Private residence Living Arrangements: Alone Available Help at Discharge: Family;Available 24 hours/day Type of Home: House Home Access: Stairs to enter [employer] of Steps: 3 Entrance Stairs-Rails: Right;Left;Can reach both Home Layout: One level     Bathroom Shower/Tub: Producer, television/film/video: Handicapped height     Home Equipment: Control and instrumentation engineer (2 wheels);Crutches   Additional Comments: DME is not  at her home, someone will have to go get it from her mother's trailer      Prior Functioning/Environment Prior Level of Function : Independent/Modified Independent;Driving             Mobility Comments: No AD; PTA going to outpatient PT for increasing difficulty with sit>stand. ADLs Comments: indep ADL/IADLs    OT Problem List: Decreased strength;Decreased range of motion;Decreased activity tolerance;Impaired balance (sitting and/or standing);Decreased safety awareness;Decreased knowledge of use of DME or AE;Decreased knowledge of precautions;Pain   OT Treatment/Interventions: Self-care/ADL training;Therapeutic exercise;DME and/or AE instruction;Patient/family education;Balance training      OT Goals(Current goals can be found in the care plan section)   Acute Rehab OT Goals Patient Stated Goal: home OT Goal Formulation: With patient Time For Goal Achievement: 03/06/24 Potential to Achieve Goals: Good ADL Goals Pt Will Perform Lower Body Dressing: with contact guard assist;sit to/from stand;with adaptive equipment Pt Will Transfer to Toilet: with contact guard assist;bedside commode;stand pivot transfer Pt Will Perform Toileting - Clothing Manipulation and hygiene: with modified independence;sitting/lateral leans Additional ADL Goal #1: Pt will complete bed mobility with min A as a precurosr to ADLs   OT Frequency:  Min 2X/week    Co-evaluation              AM-PAC OT 6 Clicks Daily Activity     Outcome Measure Help from another person eating meals?: None Help from another person taking care of personal grooming?: A Little Help from  another person toileting, which includes using toliet, bedpan, or urinal?: A Lot Help from another person bathing (including washing, rinsing, drying)?: A Lot Help from another person to put on and taking off regular upper body clothing?: A Little Help from another person to put on and taking off regular lower body clothing?: A Lot 6  Click Score: 16   End of Session Equipment Utilized During Treatment: Rolling walker (2 wheels);Gait belt Nurse Communication: Mobility status (stedy for transfers)  Activity Tolerance: Patient limited by pain;Patient tolerated treatment well Patient left: in chair;with call bell/phone within reach;with chair alarm set  OT Visit Diagnosis: Unsteadiness on feet (R26.81);Other abnormalities of gait and mobility (R26.89);Muscle weakness (generalized) (M62.81);Pain                Time: 9061-9045 OT Time Calculation (min): 16 min Charges:  OT General Charges $OT Visit: 1 Visit OT Evaluation $OT Eval Moderate Complexity: 1 Mod  Lucie Kendall, OTR/L Acute Rehabilitation Services Office [phone] Secure Chat Communication Preferred   Lucie JONETTA Kendall 02/21/2024, 10:59 AM

## 2024-02-21 NOTE — Evaluation (Signed)
 Physical Therapy Evaluation  Patient Details Name: Donna Daniels MRN: [id number] DOB: [birth year] Today's Date: 02/21/2024  History of Present Illness  Pt is an 83 y/o female who presents 02/19/2024 s/p fall in garden, sustaining a L hip fracture. She is now s/p L cephalomedullary nailing on 02/20/2024. PMH significant for Hypothyroidism, joint pain/swelling, ACDF 2013, L hand surgery, thyroidectomy, B TKA.   Clinical Impression  Pt admitted with above diagnosis. Pt currently with functional limitations due to the deficits listed below (see PT Problem List). At the time of PT eval pt was able to perform transfers with up to +2 assist and RW for support. Pt reports soreness and stiffness in the LLE but rates pain as 1/10. Pt is motivated to return home at d/c, however based on performance today recommend follow up skilled PT services <3 hours/day to maximize functional independence, safety, and return to PLOF. Pt has the potential to progress to a level where return home is appropriate and safe. Pt will benefit from acute skilled PT to increase their independence and safety with mobility to allow discharge.           If plan is discharge home, recommend the following: A lot of help with walking and/or transfers;A lot of help with bathing/dressing/bathroom;Assistance with cooking/housework;Assist for transportation;Help with stairs or ramp for entrance   Can travel by private vehicle   No    Equipment Recommendations None recommended by PT (Pt reports she can get DME from mother's house where it is currently being stored)  Recommendations for Other Services       Functional Status Assessment Patient has had a recent decline in their functional status and demonstrates the ability to make significant improvements in function in a reasonable and predictable amount of time.     Precautions / Restrictions Precautions Precautions: Fall Recall of Precautions/Restrictions:  Intact Restrictions Weight Bearing Restrictions Per Provider Order: Yes LLE Weight Bearing Per Provider Order: Weight bearing as tolerated      Mobility  Bed Mobility Overal bed mobility: Needs Assistance Bed Mobility: Supine to Sit     Supine to sit: Mod assist     General bed mobility comments: Increased time and up to mod assist for LLE advancement towards EOB. VC's for use of rails. Bed pad utilized to scoot out fully and get feet on the floor.    Transfers Overall transfer level: Needs assistance Equipment used: Rolling walker (2 wheels), 2 person hand held assist Transfers: Sit to/from Stand, Bed to chair/wheelchair/BSC Sit to Stand: Mod assist, +2 physical assistance, Max assist Stand pivot transfers: Mod assist, +2 physical assistance Step pivot transfers: Min assist, +2 physical assistance       General transfer comment: Initially attempted stand with RW and +1 assist. Pt required max assist to power up and gain/maintain standing balance. Pt with urgent need to void, so second person utilized for SPT to [facility] without RW, and then to take a few steps from [facility] to recliner with RW.    Ambulation/Gait               General Gait Details: Unable to progress to gait training this session.  Stairs            Wheelchair Mobility     Tilt Bed    Modified Rankin (Stroke Patients Only)       Balance Overall balance assessment: Needs assistance Sitting-balance support: Feet supported, No upper extremity supported Sitting balance-Leahy Scale: Fair     Standing  balance support: Bilateral upper extremity supported, During functional activity, Reliant on assistive device for balance Standing balance-Leahy Scale: Poor                               Pertinent Vitals/Pain Pain Assessment Pain Assessment: 0-10 Pain Score: 1  Pain Location: L hip Pain Descriptors / Indicators: Operative site guarding, Sore Pain Intervention(s): Limited activity  within patient's tolerance, Monitored during session, Repositioned    Home Living Family/patient expects to be discharged to:: Private residence Living Arrangements: Alone Available Help at Discharge: Family;Available 24 hours/day Type of Home: House Home Access: Stairs to enter Entrance Stairs-Rails: Right;Left;Can reach both Entrance Stairs-Number of Steps: 3   Home Layout: One level Home Equipment: Control and instrumentation engineer (2 wheels);Crutches Additional Comments: DME is not at her home, someone will have to go get it from her mother's trailer    Prior Function Prior Level of Function : Independent/Modified Independent;Driving             Mobility Comments: No AD; PTA going to outpatient PT for increasing difficulty with sit>stand.       Extremity/Trunk Assessment   Upper Extremity Assessment Upper Extremity Assessment: Defer to OT evaluation    Lower Extremity Assessment Lower Extremity Assessment: LLE deficits/detail LLE Deficits / Details: Acute pain, decreased strength and AROM consistent with pre-op diagnosis and subsequent surgery.    Cervical / Trunk Assessment Cervical / Trunk Assessment: Other exceptions Cervical / Trunk Exceptions: Forward head posture with rounded shoulders  Communication   Communication Communication: Impaired Factors Affecting Communication: Hearing impaired (Hearing aids)    Cognition Arousal: Alert Behavior During Therapy: WFL for tasks assessed/performed   PT - Cognitive impairments: No apparent impairments                         Following commands: Intact       Cueing Cueing Techniques: Verbal cues, Gestural cues     General Comments      Exercises General Exercises - Lower Extremity Ankle Circles/Pumps: 10 reps Quad Sets: 10 reps Long Arc Quad: 10 reps   Assessment/Plan    PT Assessment Patient needs continued PT services  PT Problem List Decreased strength;Decreased activity  tolerance;Decreased balance;Decreased range of motion;Decreased mobility;Decreased knowledge of use of DME;Decreased safety awareness;Decreased knowledge of precautions;Pain       PT Treatment Interventions DME instruction;Gait training;Stair training;Functional mobility training;Therapeutic activities;Therapeutic exercise;Balance training;Patient/family education    PT Goals (Current goals can be found in the Care Plan section)  Acute Rehab PT Goals Patient Stated Goal: Be able to go home and not to SNF PT Goal Formulation: With patient Time For Goal Achievement: 03/06/24 Potential to Achieve Goals: Good    Frequency Min 3X/week     Co-evaluation               AM-PAC PT 6 Clicks Mobility  Outcome Measure Help needed turning from your back to your side while in a flat bed without using bedrails?: A Little Help needed moving from lying on your back to sitting on the side of a flat bed without using bedrails?: A Lot Help needed moving to and from a bed to a chair (including a wheelchair)?: Total Help needed standing up from a chair using your arms (e.g., wheelchair or bedside chair)?: Total Help needed to walk in hospital room?: Total Help needed climbing 3-5 steps with a railing? : Total 6  Click Score: 9    End of Session Equipment Utilized During Treatment: Gait belt Activity Tolerance: Patient tolerated treatment well Patient left: with call bell/phone within reach;in chair;with chair alarm set;with family/visitor present Nurse Communication: Mobility status PT Visit Diagnosis: Unsteadiness on feet (R26.81);Pain Pain - Right/Left: Left Pain - part of body: Hip    Time: 0922-0956 PT Time Calculation (min) (ACUTE ONLY): 34 min   Charges:   PT Evaluation $PT Eval Moderate Complexity: 1 Mod PT Treatments $Gait Training: 8-22 mins PT General Charges $$ ACUTE PT VISIT: 1 Visit         Donna Daniels, PT, DPT Acute Rehabilitation Services Secure Chat  Preferred Office: [phone]   Donna Daniels 02/21/2024, 10:13 AM

## 2024-02-22 DIAGNOSIS — S72002A Fracture of unspecified part of neck of left femur, initial encounter for closed fracture: Secondary | ICD-10-CM | POA: Diagnosis not present

## 2024-02-22 LAB — CBC
HCT: 26.4 % — ABNORMAL LOW (ref 36.0–46.0)
Hemoglobin: 8.8 g/dL — ABNORMAL LOW (ref 12.0–15.0)
MCH: 29.8 pg (ref 26.0–34.0)
MCHC: 33.3 g/dL (ref 30.0–36.0)
MCV: 89.5 fL (ref 80.0–100.0)
Platelets: 195 K/uL (ref 150–400)
RBC: 2.95 MIL/uL — ABNORMAL LOW (ref 3.87–5.11)
RDW: 13.5 % (ref 11.5–15.5)
WBC: 11.6 K/uL — ABNORMAL HIGH (ref 4.0–10.5)
nRBC: 0 % (ref 0.0–0.2)

## 2024-02-22 LAB — BASIC METABOLIC PANEL WITH GFR
Anion gap: 7 (ref 5–15)
BUN: 20 mg/dL (ref 8–23)
CO2: 28 mmol/L (ref 22–32)
Calcium: 8.8 mg/dL — ABNORMAL LOW (ref 8.9–10.3)
Chloride: 100 mmol/L (ref 98–111)
Creatinine, Ser: 0.89 mg/dL (ref 0.44–1.00)
GFR, Estimated: >60 mL/min (ref >60)
Glucose, Bld: 129 mg/dL — ABNORMAL HIGH (ref 70–99)
Potassium: 4 mmol/L (ref 3.5–5.1)
Sodium: 135 mmol/L (ref 135–145)

## 2024-02-22 MED ORDER — ACETAMINOPHEN 325 MG PO TABS
975.0000 mg | ORAL_TABLET | Freq: Three times a day (TID) | ORAL | Status: AC
  Administered 2024-02-22 – 2024-02-23 (×5): 975 mg via ORAL
  Filled 2024-02-22 (×5): qty 3

## 2024-02-22 MED ORDER — OXYCODONE HCL 5 MG PO TABS
5.0000 mg | ORAL_TABLET | Freq: Four times a day (QID) | ORAL | Status: DC | PRN
  Administered 2024-02-24: 5 mg via ORAL
  Filled 2024-02-22: qty 1

## 2024-02-22 MED ORDER — TRAMADOL HCL 50 MG PO TABS
25.0000 mg | ORAL_TABLET | Freq: Once | ORAL | Status: AC | PRN
  Administered 2024-02-22: 25 mg via ORAL
  Filled 2024-02-22: qty 1

## 2024-02-22 MED ORDER — HYDROCORTISONE 1 % EX CREA
1.0000 | TOPICAL_CREAM | Freq: Three times a day (TID) | CUTANEOUS | Status: DC | PRN
  Filled 2024-02-22: qty 28

## 2024-02-22 NOTE — TOC Initial Note (Signed)
 Transition of Care [facility]) - Initial/Assessment Note    Patient Details  Name: Donna Daniels MRN: [id number] Date of Birth: July 10, 1940  [facility]) CM/SW Contact:    Bridget Cordella Simmonds, LCSW Phone Number: 02/22/2024, 3:28 PM  Clinical Narrative:   CSW met with pt regarding PT recommendation for SNF.  Pt agreeable to SNF, medicare choice document provided, permission given to send out referral in the hub.  Would like SNF in [facility].  Pt from home alone, no current services.  Permission given to speak with daughter Delon, sister Marval.                 [place] Must down, need PASSR. Referral sent out in hub for SNF.   Expected Discharge Plan: Skilled Nursing Facility Barriers to Discharge: Continued Medical Work up, SNF Pending bed offer   Patient Goals and CMS Choice Patient states their goals for this hospitalization and ongoing recovery are:: be back like I was CMS Medicare.gov Compare Post Acute Care list provided to:: Patient Choice offered to / list presented to : Patient      Expected Discharge Plan and Services In-house Referral: Clinical Social Work   Post Acute Care Choice: Skilled Nursing Facility Living arrangements for the past 2 months: Single Family Home                                      Prior Living Arrangements/Services Living arrangements for the past 2 months: Single Family Home Lives with:: Self Patient language and need for interpreter reviewed:: Yes Do you feel safe going back to the place where you live?: Yes      Need for Family Participation in Patient Care: Yes (Comment) Care giver support system in place?: Yes (comment) Current home services: Other (comment) (none) Criminal Activity/Legal Involvement Pertinent to Current Situation/Hospitalization: No - Comment as needed  Activities of Daily Living      Permission Sought/Granted Permission sought to share information with : Family Supports Permission granted  to share information with : Yes, Verbal Permission Granted  Share Information with NAME: daughter Delon, sister Marval  Permission granted to share info w AGENCY: SNF        Emotional Assessment Appearance:: Appears stated age Attitude/Demeanor/Rapport: Engaged Affect (typically observed): Appropriate, Pleasant Orientation: : Oriented to Self, Oriented to Place, Oriented to  Time, Oriented to Situation      Admission diagnosis:  Closed left hip fracture (HCC) [S72.002A] Closed fracture of left hip, initial encounter (HCC) [S72.002A] Patient Active Problem List   Diagnosis Date Noted   Closed left hip fracture (HCC) 02/19/2024   High cholesterol    Hypothyroidism    Abnormality of gait 08/17/2013   Stiffness of left  knee 08/17/2013   S/P total knee replacement 07/25/2013   Stiffness of joint, not elsewhere classified, lower leg 05/03/2013   Difficulty walking 05/03/2013   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 [place] [address] Phone: [phone] Fax: [phone]  [facility] [phone] - [place], [place] - [address] AT [facility] [address] [place] [address] Phone: [phone] Fax: [phone]  Social Drivers of Health (SDOH) Social History: SDOH Screenings   Food Insecurity: No Food Insecurity (02/19/2024)  Housing: Low Risk  (02/19/2024)  Transportation Needs: No Transportation Needs (02/19/2024)  Utilities: Not At Risk (02/19/2024)  Social Connections: Unknown (02/19/2024)  Tobacco Use: Low Risk  (02/20/2024)   SDOH Interventions:     Readmission Risk Interventions     No data to display

## 2024-02-22 NOTE — Progress Notes (Signed)
 [facility] JINNY Kipper NP chat for po pain medication patient only has Morphine  IV

## 2024-02-22 NOTE — NC FL2 (Signed)
 [place]  MEDICAID FL2 LEVEL OF CARE FORM     IDENTIFICATION  Patient Name: Donna Daniels Birthdate: [birth year] Sex: female Admission Date (Current Location): 02/19/2024  [facility] and [place] Number:  Producer, television/film/video and Address:  [facility], [address] [place], [place] [address]      Provider Number: [id number]  Attending Physician Name and Address:  Christobal Guadalajara, MD  Relative Name and Phone Number:  Weisgerber,Jennifer Daughter   [phone]    Current Level of Care: Hospital Recommended Level of Care: Skilled Nursing Facility Prior Approval Number:    Date Approved/Denied:   PASRR Number:    Discharge Plan: SNF    Current Diagnoses: Patient Active Problem List   Diagnosis Date Noted   Closed left hip fracture (HCC) 02/19/2024   High cholesterol    Hypothyroidism    Abnormality of gait 08/17/2013   Stiffness of left  knee 08/17/2013   S/P total knee replacement 07/25/2013   Stiffness of joint, not elsewhere classified, lower leg 05/03/2013   Difficulty walking 05/03/2013   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   CLOSED FRACTURE OF PATELLA 03/06/2010    Orientation RESPIRATION BLADDER Height & Weight     Self, Time, Situation, Place  Normal External catheter, Continent Weight: 169 lb 8.5 oz (76.9 kg) Height:  5' 6 (167.6 cm)  BEHAVIORAL SYMPTOMS/MOOD NEUROLOGICAL BOWEL NUTRITION STATUS      Continent Diet (see discharge summary)  AMBULATORY STATUS COMMUNICATION OF NEEDS Skin   Limited Assist Verbally Surgical wounds                       Personal Care Assistance Level of Assistance  Bathing, Feeding, Dressing Bathing Assistance: Maximum assistance Feeding assistance: Independent Dressing Assistance: Maximum assistance     Functional Limitations Info  Sight, Hearing, Speech Sight Info: Adequate Hearing Info: Adequate Speech Info: Adequate    SPECIAL CARE FACTORS FREQUENCY  PT (By licensed PT), OT (By licensed OT)     PT  Frequency: 5x week OT Frequency: 5x week            Contractures Contractures Info: Not present    Additional Factors Info  Code Status, Allergies Code Status Info: full Allergies Info: NKA           Current Medications (02/22/2024):  This is the current hospital active medication list Current Facility-Administered Medications  Medication Dose Route Frequency Provider Last Rate Last Admin   acetaminophen  (TYLENOL ) tablet 975 mg  975 mg Oral TID Christobal Guadalajara, MD   975 mg at 02/22/24 1516   calcium -vitamin D (OSCAL WITH D) 500-5 MG-MCG per tablet 1 tablet  1 tablet Oral Daily Elsa Lonni SAUNDERS, MD   1 tablet at 02/22/24 9083   docusate sodium  (COLACE) capsule 100 mg  100 mg Oral BID Elsa Lonni SAUNDERS, MD   100 mg at 02/22/24 9083   ferrous sulfate  tablet 325 mg  325 mg Oral BID WC Kc, Guadalajara, MD   325 mg at 02/22/24 0916   levothyroxine  (SYNTHROID ) tablet 88 mcg  88 mcg Oral Q0600 Elsa Lonni SAUNDERS, MD   88 mcg at 02/22/24 9348   metoCLOPramide  (REGLAN ) tablet 5-10 mg  5-10 mg Oral Q8H PRN Elsa Lonni SAUNDERS, MD       Or   metoCLOPramide  (REGLAN ) injection 5-10 mg  5-10 mg Intravenous Q8H PRN Elsa Lonni SAUNDERS, MD       morphine  (PF) 4 MG/ML injection 4 mg  4 mg Intravenous Q3H PRN Elsa Lonni SAUNDERS, MD   4 mg at 02/20/24 1849   Muscle Rub CREA   Topical PRN Christobal Guadalajara, MD   Given at 02/22/24 1516   ondansetron  (ZOFRAN ) injection 4 mg  4 mg Intravenous Q6H PRN Elsa Lonni SAUNDERS, MD       oxyCODONE  (Oxy IR/ROXICODONE ) immediate release tablet 5 mg  5 mg Oral Q6H PRN Kc, Ramesh, MD       pantoprazole  (PROTONIX ) EC tablet 40 mg  40 mg Oral Daily Elsa Lonni SAUNDERS, MD   40 mg at 02/22/24 9083   rosuvastatin  (CRESTOR ) tablet 20 mg  20 mg Oral QHS Elsa Lonni SAUNDERS, MD   20 mg at 02/21/24 2228     Discharge Medications: Please see discharge summary for a list of discharge medications.  Relevant Imaging Results:  Relevant Lab Results:   Additional  Information SSN: [id number]  Bridget Cordella Simmonds, LCSW

## 2024-02-22 NOTE — Progress Notes (Signed)
 Physical Therapy Treatment  Patient Details Name: Donna Daniels MRN: [id number] DOB: 1940-08-14 Today's Date: 02/22/2024   History of Present Illness Pt is an 83 y/o female who presents 02/19/2024 s/p fall in garden, sustaining a L hip fracture. She is now s/p L cephalomedullary nailing on 02/20/2024. PMH significant for Hypothyroidism, joint pain/swelling, ACDF 2013, L hand surgery, thyroidectomy, B TKA.    PT Comments  Pt progressing towards physical therapy goals. Was able to perform transfers and ambulation with up to min assist, RW, and chair follow for safety during gait training. Pt still hopeful for return home at d/c, however pt/daughter agreeable to SNF level rehab as a plan B. Answered pt and family's questions within scope of practice. Will continue to follow and progress as able per POC.    If plan is discharge home, recommend the following: A lot of help with walking and/or transfers;A lot of help with bathing/dressing/bathroom;Assistance with cooking/housework;Assist for transportation;Help with stairs or ramp for entrance   Can travel by private vehicle     No  Equipment Recommendations  None recommended by PT (Pt reports she can get DME from mother's house where it is currently being stored)    Recommendations for Other Services       Precautions / Restrictions Precautions Precautions: Fall Recall of Precautions/Restrictions: Intact Restrictions Weight Bearing Restrictions Per Provider Order: Yes LLE Weight Bearing Per Provider Order: Weight bearing as tolerated     Mobility  Bed Mobility               General bed mobility comments: pt on the EOB with family on arrival    Transfers Overall transfer level: Needs assistance Equipment used: Rolling walker (2 wheels) Transfers: Sit to/from Stand Sit to Stand: Mod assist           General transfer comment: VC's and assist with bed pad to scoot out fully to EOB. Assist to power up to full stand  and to  gain/maintain standing balance.    Ambulation/Gait Ambulation/Gait assistance: Min assist Gait Distance (Feet): 40 Feet Assistive device: Rolling walker (2 wheels) Gait Pattern/deviations: Step-through pattern, Decreased stride length, Trunk flexed, Step-to pattern Gait velocity: Decreased Gait velocity interpretation: <1.31 ft/sec, indicative of household ambulator   General Gait Details: Slow and guarded with difficulty advancing LE's. Pt with very short step/stride length, but able to make corrective changes with cues. Fatigues quickly however, and pt unable to maintain corrective changes. Chair follow utilized for safety.   Stairs             Wheelchair Mobility     Tilt Bed    Modified Rankin (Stroke Patients Only)       Balance Overall balance assessment: Needs assistance Sitting-balance support: Feet supported, No upper extremity supported Sitting balance-Leahy Scale: Fair     Standing balance support: Bilateral upper extremity supported, During functional activity, Reliant on assistive device for balance Standing balance-Leahy Scale: Poor                              Communication Communication Communication: Impaired Factors Affecting Communication: Hearing impaired  Cognition Arousal: Alert Behavior During Therapy: WFL for tasks assessed/performed   PT - Cognitive impairments: No apparent impairments                         Following commands: Intact      Cueing Cueing Techniques: Verbal cues, Gestural  cues  Exercises General Exercises - Lower Extremity Ankle Circles/Pumps: 10 reps Quad Sets: 10 reps Long Arc Quad: 10 reps Hip ABduction/ADduction: 10 reps, AAROM    General Comments        Pertinent Vitals/Pain Pain Assessment Pain Assessment: Faces Faces Pain Scale: Hurts a little bit Pain Location: L hip Pain Descriptors / Indicators: Operative site guarding, Sore Pain Intervention(s): Limited activity within  patient's tolerance, Monitored during session, Repositioned    Home Living                          Prior Function            PT Goals (current goals can now be found in the care plan section) Acute Rehab PT Goals Patient Stated Goal: Be able to go home and not to SNF PT Goal Formulation: With patient Time For Goal Achievement: 03/06/24 Potential to Achieve Goals: Good Progress towards PT goals: Progressing toward goals    Frequency    Min 3X/week      PT Plan      Co-evaluation              AM-PAC PT 6 Clicks Mobility   Outcome Measure  Help needed turning from your back to your side while in a flat bed without using bedrails?: A Little Help needed moving from lying on your back to sitting on the side of a flat bed without using bedrails?: A Lot Help needed moving to and from a bed to a chair (including a wheelchair)?: Total Help needed standing up from a chair using your arms (e.g., wheelchair or bedside chair)?: Total Help needed to walk in hospital room?: Total Help needed climbing 3-5 steps with a railing? : Total 6 Click Score: 9    End of Session Equipment Utilized During Treatment: Gait belt Activity Tolerance: Patient tolerated treatment well Patient left: with call bell/phone within reach;in chair;with chair alarm set;with family/visitor present Nurse Communication: Mobility status PT Visit Diagnosis: Unsteadiness on feet (R26.81);Pain Pain - Right/Left: Left Pain - part of body: Hip     Time: 1021-1050 PT Time Calculation (min) (ACUTE ONLY): 29 min  Charges:    $Gait Training: 8-22 mins $Therapeutic Exercise: 8-22 mins PT General Charges $$ ACUTE PT VISIT: 1 Visit                     [name], PT, DPT Acute Rehabilitation Services Secure Chat Preferred Office: [phone]    [name] 02/22/2024, 1:04 PM

## 2024-02-22 NOTE — Care Management Important Message (Signed)
 Important Message  Patient Details  Name: Donna Daniels MRN: [id number] Date of Birth: 07-Oct-1940   Important Message Given:  Yes - Medicare IM     Jon Cruel 02/22/2024, 4:06 PM

## 2024-02-22 NOTE — Progress Notes (Signed)
 PROGRESS NOTE Donna Daniels  FMW:[id number] DOB: 1940-11-07 DOA: 02/19/2024 PCP: Marvine Rush, MD  Brief Narrative/Hospital Course:  83 y.o. female with medical history significant of hypothyroidism, hyperlipidemia, history of bilateral knee replacement.  Patient had a fall earlier today while in the garden.  She became off balance and fell on her right hip, having immediate pain in the left hip and was unable to move it.  A neighbor saw her and came to her aid.  EMS was called and she was transported to the hospital.  No fevers, chills, nausea, vomiting.  Denies chest pain, shortness of breath.  She is normally able to ambulate without too much difficulty.  No chest pain or shortness of breath with activity.   Subjective: Seen and examined About to work with PT OT Complains of soreness on the left hip Labs reviewed hemoglobin drifting 12.4> 9.4>8.8  Assessment and plan:  Closed left hip fracture : Patient underwent FIXATION, FRACTURE, INTERTROCHANTERIC, WITH INTRAMEDULLARY ROD. Continue pain management, DVT prophylaxis as per orthopedics PT OT as able-LAD skilled nursing facility. Orthopedic recommending aspirin  325 on discharge and follow-up in 2 weeks. Discussed with Dr. Elsa this morning-holding off on DVT prophylaxis chemical at this time and reassess with hemoglobin check in a.m. Add Oxy for pain medicine and scheduled Tylenol , continue muscle reduction  ABLA: Suspect in the setting of hip fracture/surgery hemoglobin down to 9.4 will monitor add iron supplement Recent Labs  Lab 02/19/24 1541 02/21/24 0627 02/22/24 0601  HGB 12.4 9.4* 8.8*  HCT 38.3 28.3* 26.4*    Brief hypotension perioperatively: BP stable  Hyperlipidemia Continue statin  Hypothyroidism Continue levothyroxine   Mobility: PT Orders: Active  PT Follow up Rec: Skilled Nursing-Short Term Rehab (<3 Hours/Day)02/21/2024 [phone]    DVT prophylaxis: SCDs Start: 02/20/24 1052 SCDs Start: 02/19/24 2001 Code  Status:   Code Status: Full Code Family Communication: plan of care discussed with patient at bedside. Patient status is: Remains hospitalized because of severity of illness Level of care: Med-Surg   Dispo: The patient is from: home            Anticipated disposition: SNF Objective: Vitals last 24 hrs: Vitals:   02/21/24 1637 02/21/24 2029 02/22/24 0419 02/22/24 0834  BP: 129/71 (!) 128/53 129/75 (!) 123/59  Pulse: (!) 102 69 93 62  Resp: 16 16 20 17   Temp: 97.6 F (36.4 C) 98.3 F (36.8 C) 97.9 F (36.6 C) 98.2 F (36.8 C)  TempSrc: Oral Oral Oral   SpO2: 94% 95% 93% 95%  Weight:      Height:        Physi General exam: AAOX3 HEENT:Oral mucosa moist, Ear/Nose WNL grossly Respiratory system: Bilaterally clear BS,no use of accessory muscle Cardiovascular system: S1 & S2 +, No JVD. Gastrointestinal system: Abdomen soft,NT,ND, BS+ Nervous System: Alert, awake, moving all extremities,and following commands. Extremities: No leg edema, left hip with Aquacel dressing in place clean dry intact Skin: No rashes,no icterus. MSK: Normal muscle bulk,tone, power   Medications reviewed:  Scheduled Meds:  calcium -vitamin D  1 tablet Oral Daily   docusate sodium   100 mg Oral BID   ferrous sulfate   325 mg Oral BID WC   levothyroxine   88 mcg Oral Q0600   pantoprazole   40 mg Oral Daily   rosuvastatin   20 mg Oral QHS   Continuous Infusions:   Diet: Diet Order             Diet regular Room service appropriate? Yes; Fluid consistency: Thin  Diet effective now                   Data Reviewed: I have personally reviewed following labs and imaging studies ( see epic result tab) CBC: Recent Labs  Lab 02/19/24 1541 02/21/24 0627 02/22/24 0601  WBC 9.0 13.5* 11.6*  NEUTROABS 5.7  --   --   HGB 12.4 9.4* 8.8*  HCT 38.3 28.3* 26.4*  MCV 91.8 91.3 89.5  PLT 232 190 195   CMP: Recent Labs  Lab 02/19/24 1541 02/21/24 0627 02/22/24 0601  NA 139 135 135  K 3.8 4.5 4.0  CL  101 101 100  CO2 25 27 28   GLUCOSE 131* 140* 129*  BUN 16 14 20   CREATININE 0.95 0.92 0.89  CALCIUM  9.2 9.1 8.8*   GFR: Estimated Creatinine Clearance: 50.1 mL/min (by C-G formula based on SCr of 0.89 mg/dL). Recent Labs  Lab 02/19/24 1541  AST 22  ALT 15  ALKPHOS 55  BILITOT 0.9  PROT 6.7  ALBUMIN  3.7   No results for input(s): LIPASE, AMYLASE in the last 168 hours. No results for input(s): AMMONIA in the last 168 hours. Coagulation Profile: No results for input(s): INR, PROTIME in the last 168 hours. Unresulted Labs (From admission, onward)     Start     Ordered   02/21/24 0500  Basic metabolic panel with GFR  Daily,   R      02/20/24 1613   02/21/24 0500  CBC  Daily,   R      02/20/24 1613           Antimicrobials/Microbiology: Anti-infectives (From admission, onward)    Start     Dose/Rate Route Frequency Ordered Stop   02/20/24 1145  ceFAZolin  (ANCEF ) IVPB 2g/100 mL premix        2 g 200 mL/hr over 30 Minutes Intravenous Every 6 hours 02/20/24 1051 02/20/24 1658   02/20/24 0600  ceFAZolin  (ANCEF ) IVPB 2g/100 mL premix        2 g 200 mL/hr over 30 Minutes Intravenous On call to O.R. 02/19/24 2009 02/20/24 0903      No results found for: SDES, SPECREQUEST, CULT, REPTSTATUS  Procedures: Procedure(s) (LRB): FIXATION, FRACTURE, INTERTROCHANTERIC, WITH INTRAMEDULLARY ROD (Left)   Mennie LAMY, MD [facility] 02/22/2024, 11:11 AM

## 2024-02-22 NOTE — Progress Notes (Signed)
     Donna Daniels is a 83 y.o. female   Orthopaedic diagnosis:Left IT hip fracture  Subjective: Patient seen at bedside.  She is doing well.  She worked with physical therapy and walked in the hall some.  Some soreness but overall doing well.  Objectyive: Vitals:   02/22/24 0419 02/22/24 0834  BP: 129/75 (!) 123/59  Pulse: 93 62  Resp: 20 17  Temp: 97.9 F (36.6 C) 98.2 F (36.8 C)  SpO2: 93% 95%     Exam: Awake and alert Respirations even and unlabored No acute distress  Left hip with intact dressing.  No saturation or strikethrough.  Assessment: Left intertrochanteric hip fracture status post intramedullary nail fixation   Plan: She is doing well.  She will mobilize with therapy.  She is weightbearing as tolerated.  If hemoglobin stable tomorrow we will start Lovenox for DVT prophylaxis.  She will be discharged on aspirin .  Will plan to see her back in 2 weeks.   Medford Pae, MD

## 2024-02-23 DIAGNOSIS — S72002A Fracture of unspecified part of neck of left femur, initial encounter for closed fracture: Secondary | ICD-10-CM | POA: Diagnosis not present

## 2024-02-23 LAB — CBC
HCT: 25.3 % — ABNORMAL LOW (ref 36.0–46.0)
Hemoglobin: 8.5 g/dL — ABNORMAL LOW (ref 12.0–15.0)
MCH: 30 pg (ref 26.0–34.0)
MCHC: 33.6 g/dL (ref 30.0–36.0)
MCV: 89.4 fL (ref 80.0–100.0)
Platelets: 209 K/uL (ref 150–400)
RBC: 2.83 MIL/uL — ABNORMAL LOW (ref 3.87–5.11)
RDW: 13.4 % (ref 11.5–15.5)
WBC: 8.7 K/uL (ref 4.0–10.5)
nRBC: 0 % (ref 0.0–0.2)

## 2024-02-23 LAB — BASIC METABOLIC PANEL WITH GFR
Anion gap: 10 (ref 5–15)
BUN: 16 mg/dL (ref 8–23)
CO2: 26 mmol/L (ref 22–32)
Calcium: 8.7 mg/dL — ABNORMAL LOW (ref 8.9–10.3)
Chloride: 101 mmol/L (ref 98–111)
Creatinine, Ser: 0.83 mg/dL (ref 0.44–1.00)
GFR, Estimated: >60 mL/min (ref >60)
Glucose, Bld: 121 mg/dL — ABNORMAL HIGH (ref 70–99)
Potassium: 3.7 mmol/L (ref 3.5–5.1)
Sodium: 137 mmol/L (ref 135–145)

## 2024-02-23 NOTE — TOC Progression Note (Addendum)
 Transition of Care [facility]) - Progression Note    Patient Details  Name: Donna Daniels MRN: [id number] Date of Birth: [birth year]  Transition of Care [facility]) CM/SW Contact  Bridget Cordella Simmonds, LCSW Phone Number: 02/23/2024, 10:28 AM  Clinical Narrative:   Bed offers provided to pt and daughter Donna Daniels.  They will accept offer at [facility].   CSW confirmed with Kerri/Penn.   [id number] NOTICE obtained: [phone]  SNF auth request submitted in Nav[employer] and approved: [id number], 5 days: 8/28-9/1.  Expected Discharge Plan: Skilled Nursing Facility Barriers to Discharge: Continued Medical Work up, SNF Pending bed offer               Expected Discharge Plan and Services In-house Referral: Clinical Social Work   Post Acute Care Choice: Skilled Nursing Facility Living arrangements for the past 2 months: Single Family Home                                       Social Drivers of Health (SDOH) Interventions SDOH Screenings   Food Insecurity: No Food Insecurity (02/19/2024)  Housing: Low Risk  (02/19/2024)  Transportation Needs: No Transportation Needs (02/19/2024)  Utilities: Not At Risk (02/19/2024)  Social Connections: Unknown (02/19/2024)  Tobacco Use: Low Risk  (02/20/2024)    Readmission Risk Interventions     No data to display

## 2024-02-23 NOTE — Progress Notes (Signed)
 Occupational Therapy Treatment Patient Details Name: Donna Daniels MRN: [id number] DOB: 1941-06-15 Today's Date: 02/23/2024   History of present illness Pt is an 83 y/o female who presents 02/19/2024 s/p fall in garden, sustaining a L hip fracture. She is now s/p L cephalomedullary nailing on 02/20/2024. PMH significant for Hypothyroidism, joint pain/swelling, ACDF 2013, L hand surgery, thyroidectomy, B TKA.   OT comments  Pt is making steady progress towards their acute OT goals. She continues to be limited by bed mobility and STS transfers, however once she is up she is able to mobilize with CGA. Mod A needed to stand from all surfaces. Pt halos requires at least 1 UE supported externally when standing. OT to continue to follow acutely to facilitate progress towards established goals. Pt will continue to benefit from skilled inpatient follow up therapy, <3 hours/day.       If plan is discharge home, recommend the following:  A lot of help with walking and/or transfers;A lot of help with bathing/dressing/bathroom;Assistance with cooking/housework;Direct supervision/assist for medications management;Direct supervision/assist for financial management;Assist for transportation;Help with stairs or ramp for entrance   Equipment Recommendations  Other (comment)       Precautions / Restrictions Precautions Precautions: Fall Recall of Precautions/Restrictions: Intact Restrictions Weight Bearing Restrictions Per Provider Order: Yes LLE Weight Bearing Per Provider Order: Weight bearing as tolerated       Mobility Bed Mobility Overal bed mobility: Needs Assistance Bed Mobility: Supine to Sit     Supine to sit: Mod assist     General bed mobility comments: needs assist for LLE    Transfers Overall transfer level: Needs assistance Equipment used: Rolling walker (2 wheels) Transfers: Sit to/from Stand Sit to Stand: Mod assist           General transfer comment: mod A to stand,  once standing pt is able to mobilize with RW and CGA     Balance Overall balance assessment: Needs assistance Sitting-balance support: Feet supported, No upper extremity supported Sitting balance-Leahy Scale: Fair     Standing balance support: Bilateral upper extremity supported, During functional activity, Reliant on assistive device for balance Standing balance-Leahy Scale: Poor                             ADL either performed or assessed with clinical judgement   ADL Overall ADL's : Needs assistance/impaired                         Toilet Transfer: Moderate assistance;Ambulation;Rolling walker (2 wheels)   Toileting- Clothing Manipulation and Hygiene: Contact guard assist;Sitting/lateral lean       Functional mobility during ADLs: Moderate assistance;Rolling walker (2 wheels) General ADL Comments: continues to be limited by STS transfers    Extremity/Trunk Assessment Upper Extremity Assessment Upper Extremity Assessment: Generalized weakness   Lower Extremity Assessment Lower Extremity Assessment: Defer to PT evaluation        Vision   Vision Assessment?: No apparent visual deficits   Perception Perception Perception: Within Functional Limits   Praxis Praxis Praxis: WFL   Communication Communication Communication: Impaired Factors Affecting Communication: Hearing impaired   Cognition Arousal: Alert Behavior During Therapy: WFL for tasks assessed/performed Cognition: No apparent impairments                               Following commands: Intact  Cueing   Cueing Techniques: Verbal cues, Gestural cues  Exercises      Shoulder Instructions       General Comments VSS    Pertinent Vitals/ Pain       Pain Assessment Pain Assessment: Faces Faces Pain Scale: Hurts a little bit Pain Location: L hip Pain Descriptors / Indicators: Sore Pain Intervention(s): Monitored during session, Limited activity within  patient's tolerance  Home Living Family/patient expects to be discharged to:: Skilled nursing facility Living Arrangements: Alone                                      Prior Functioning/Environment              Frequency  Min 2X/week        Progress Toward Goals  OT Goals(current goals can now be found in the care plan section)  Progress towards OT goals: Progressing toward goals  Acute Rehab OT Goals Patient Stated Goal: to get better OT Goal Formulation: With patient Time For Goal Achievement: 03/06/24 Potential to Achieve Goals: Good ADL Goals Pt Will Perform Lower Body Dressing: with contact guard assist;sit to/from stand;with adaptive equipment Pt Will Transfer to Toilet: with contact guard assist;bedside commode;stand pivot transfer Pt Will Perform Toileting - Clothing Manipulation and hygiene: with modified independence;sitting/lateral leans Additional ADL Goal #1: Pt will complete bed mobility with min A as a precurosr to ADLs  Plan      Co-evaluation                 AM-PAC OT 6 Clicks Daily Activity     Outcome Measure   Help from another person eating meals?: None Help from another person taking care of personal grooming?: A Little Help from another person toileting, which includes using toliet, bedpan, or urinal?: A Lot Help from another person bathing (including washing, rinsing, drying)?: A Lot Help from another person to put on and taking off regular upper body clothing?: A Little Help from another person to put on and taking off regular lower body clothing?: A Lot 6 Click Score: 16    End of Session Equipment Utilized During Treatment: Rolling walker (2 wheels);Gait belt  OT Visit Diagnosis: Unsteadiness on feet (R26.81);Other abnormalities of gait and mobility (R26.89);Muscle weakness (generalized) (M62.81);Pain   Activity Tolerance Patient limited by pain;Patient tolerated treatment well   Patient Left in bed;with call  bell/phone within reach;with bed alarm set   Nurse Communication Mobility status        Time: 8781-8764 OT Time Calculation (min): 17 min  Charges: OT General Charges $OT Visit: 1 Visit OT Treatments $Therapeutic Activity: 8-22 mins  Lucie Kendall, OTR/L Acute Rehabilitation Services Office [phone] Secure Chat Communication Preferred   Lucie JONETTA Kendall 02/23/2024, 3:07 PM

## 2024-02-23 NOTE — Plan of Care (Signed)

## 2024-02-23 NOTE — Plan of Care (Signed)
  Problem: Education: Goal: Knowledge of General Education information will improve Description: Including pain rating scale, medication(s)/side effects and non-pharmacologic comfort measures Outcome: Progressing   Problem: Health Behavior/Discharge Planning: Goal: Ability to manage health-related needs will improve Outcome: Progressing   Problem: Clinical Measurements: Goal: Ability to maintain clinical measurements within normal limits will improve Outcome: Progressing Goal: Will remain free from infection Outcome: Progressing Goal: Diagnostic test results will improve Outcome: Progressing Goal: Respiratory complications will improve Outcome: Progressing Goal: Cardiovascular complication will be avoided Outcome: Progressing   Problem: Activity: Goal: Risk for activity intolerance will decrease Outcome: Progressing   Problem: Elimination: Goal: Will not experience complications related to bowel motility Outcome: Progressing Goal: Will not experience complications related to urinary retention Outcome: Progressing   Problem: Coping: Goal: Level of anxiety will decrease Outcome: Progressing   Problem: Safety: Goal: Ability to remain free from injury will improve Outcome: Progressing   Problem: Skin Integrity: Goal: Risk for impaired skin integrity will decrease Outcome: Progressing

## 2024-02-23 NOTE — Progress Notes (Signed)
 PROGRESS NOTE  Donna Daniels FMW:[id number] DOB: Jul 19, 1940 DOA: 02/19/2024 PCP: Marvine Rush, MD   LOS: 4 days   Brief Narrative / Interim history: 83 y.o. female with medical history significant of hypothyroidism, hyperlipidemia, history of bilateral knee replacement.  Patient had a fall while in the garden.  She became off balance and fell on her right hip, having immediate pain in the left hip and was unable to move it.  A neighbor saw her and came to her aid.  EMS was called and she was transported to the hospital.  No fevers, chills, nausea, vomiting.  Denies chest pain, shortness of breath.  She is normally able to ambulate without too much difficulty.  No chest pain or shortness of breath with activity.   Subjective / 24h Interval events: Doing well today, no chest pain, shortness of breath, no abdominal discomfort, no nausea or vomiting  Assesement and Plan: Principal Problem:   Closed left hip fracture (HCC) Active Problems:   High cholesterol   Hypothyroidism   Principal problem Closed left hip fracture -orthopedic surgery consulted, she underwent intertrochanteric IM rod fixation with Dr. Elsa on 8/24. Continue pain management, DVT prophylaxis as per orthopedics PT OT as able-LAD skilled nursing facility. - Orthopedic recommending aspirin  325 on discharge and follow-up in 2 weeks. -Continue pain control   Active problems ABLA - Suspect in the setting of hip fracture/surgery hemoglobin continues to trend down at 8.5.  No bleeding  Brief hypotension perioperatively - BP overall acceptable   Hyperlipidemia - Continue statin   Hypothyroidism - Continue levothyroxine   Scheduled Meds:  acetaminophen   975 mg Oral TID   calcium -vitamin D  1 tablet Oral Daily   docusate sodium   100 mg Oral BID   ferrous sulfate   325 mg Oral BID WC   levothyroxine   88 mcg Oral Q0600   pantoprazole   40 mg Oral Daily   rosuvastatin   20 mg Oral QHS   Continuous Infusions: PRN  Meds:.hydrocortisone  cream, metoCLOPramide  **OR** metoCLOPramide  (REGLAN ) injection, morphine  (PF), Muscle Rub, ondansetron , oxyCODONE   Current Outpatient Medications  Medication Instructions   calcium -vitamin D (OSCAL) 250-125 MG-UNIT per tablet 2 tablets, Oral, Daily   cholecalciferol  (VITAMIN D) 1,000 Units, Oral, 2 times daily   cyclobenzaprine  (FLEXERIL ) 10 mg, Oral, 3 times daily PRN   levocetirizine (XYZAL) 5 mg, Oral, Daily   levothyroxine  (SYNTHROID ) 88 mcg, Oral, Daily   meloxicam (MOBIC) 15 mg, Oral, Daily PRN   Multiple Vitamin (MULTIVITAMIN WITH MINERALS) TABS 1 tablet, Oral, Daily   Omega-3 Fatty Acids (FISH OIL) 1000 MG CAPS 1 capsule, Oral, Daily   omeprazole (PRILOSEC) 20 mg, Oral, Daily   rosuvastatin  (CRESTOR ) 20 mg, Oral, Daily at bedtime    Diet Orders (From admission, onward)     Start     Ordered   02/20/24 1052  Diet regular Room service appropriate? Yes; Fluid consistency: Thin  Diet effective now       Question Answer Comment  Room service appropriate? Yes   Fluid consistency: Thin      02/20/24 1051            DVT prophylaxis: SCDs Start: 02/20/24 1052 SCDs Start: 02/19/24 2001   Lab Results  Component Value Date   PLT 209 02/23/2024      Code Status: Full Code  Family Communication: No family at bedside  Status is: Inpatient Remains inpatient appropriate because: severity of illness  Level of care: Med-Surg  Consultants:  Orthopedics   Objective: Vitals:  02/22/24 1622 02/22/24 1935 02/23/24 0435 02/23/24 0734  BP: (!) 119/53 121/70 (!) 134/59 136/68  Pulse: 87 76 85 (!) 101  Resp: 18 16 17 18   Temp: (!) 97.5 F (36.4 C) 98.1 F (36.7 C) 98.4 F (36.9 C) 98.1 F (36.7 C)  TempSrc:  Oral Oral Oral  SpO2: 95% 95% 94% 97%  Weight:      Height:        Intake/Output Summary (Last 24 hours) at 02/23/2024 0949 Last data filed at 02/23/2024 9266 Gross per 24 hour  Intake 480 ml  Output --  Net 480 ml   Wt Readings from  Last 3 Encounters:  02/19/24 76.9 kg  07/17/13 71.8 kg  04/13/13 73 kg    Examination:  Constitutional: NAD Eyes: no scleral icterus ENMT: Mucous membranes are moist.  Neck: normal, supple Respiratory: clear to auscultation bilaterally, no wheezing, no crackles.  Cardiovascular: Regular rate and rhythm, no murmurs / rubs / gallops.  Abdomen: non distended, no tenderness. Bowel sounds positive.  Musculoskeletal: no clubbing / cyanosis.    Data Reviewed: I have independently reviewed following labs and imaging studies   CBC Recent Labs  Lab 02/19/24 1541 02/21/24 0627 02/22/24 0601 02/23/24 0509  WBC 9.0 13.5* 11.6* 8.7  HGB 12.4 9.4* 8.8* 8.5*  HCT 38.3 28.3* 26.4* 25.3*  PLT 232 190 195 209  MCV 91.8 91.3 89.5 89.4  MCH 29.7 30.3 29.8 30.0  MCHC 32.4 33.2 33.3 33.6  RDW 13.4 13.5 13.5 13.4  LYMPHSABS 2.3  --   --   --   MONOABS 0.8  --   --   --   EOSABS 0.1  --   --   --   BASOSABS 0.1  --   --   --     Recent Labs  Lab 02/19/24 1541 02/21/24 0627 02/22/24 0601 02/23/24 0509  NA 139 135 135 137  K 3.8 4.5 4.0 3.7  CL 101 101 100 101  CO2 25 27 28 26   GLUCOSE 131* 140* 129* 121*  BUN 16 14 20 16   CREATININE 0.95 0.92 0.89 0.83  CALCIUM  9.2 9.1 8.8* 8.7*  AST 22  --   --   --   ALT 15  --   --   --   ALKPHOS 55  --   --   --   BILITOT 0.9  --   --   --   ALBUMIN  3.7  --   --   --     ------------------------------------------------------------------------------------------------------------------ No results for input(s): CHOL, HDL, LDLCALC, TRIG, CHOLHDL, LDLDIRECT in the last 72 hours.  Lab Results  Component Value Date   HGBA1C  04/25/2007    5.7 (NOTE)   The ADA recommends the following therapeutic goals for glycemic   control related to Hgb A1C measurement:   Goal of Therapy:   < 7.0% Hgb A1C   Action Suggested:  > 8.0% Hgb A1C   Ref:  Diabetes Care, 22, Suppl. 1, 1999    ------------------------------------------------------------------------------------------------------------------ No results for input(s): TSH, T4TOTAL, T3FREE, THYROIDAB in the last 72 hours.  Invalid input(s): FREET3  Cardiac Enzymes No results for input(s): CKMB, TROPONINI, MYOGLOBIN in the last 168 hours.  Invalid input(s): CK ------------------------------------------------------------------------------------------------------------------ No results found for: BNP  CBG: No results for input(s): GLUCAP in the last 168 hours.  Recent Results (from the past 240 hours)  Surgical pcr screen     Status: None   Collection Time: 02/20/24  5:15 AM  Specimen: Nasal Mucosa; Nasal Swab  Result Value Ref Range Status   MRSA, PCR NEGATIVE NEGATIVE Final   Staphylococcus aureus NEGATIVE NEGATIVE Final    Comment: (NOTE) The Xpert SA Assay (FDA approved for NASAL specimens in patients 11 years of age and older), is one component of a comprehensive surveillance program. It is not intended to diagnose infection nor to guide or monitor treatment. Performed at [facility], [address], [place], [place] [address]      Radiology Studies: No results found.   Nilda Fendt, MD, PhD [facility]  Between 7 am - 7 pm I am available, please contact me via Amion (for emergencies) or Securechat (non urgent messages)  Between 7 pm - 7 am I am not available, please contact night coverage MD/APP via Amion

## 2024-02-24 ENCOUNTER — Non-Acute Institutional Stay (SKILLED_NURSING_FACILITY): Admitting: Adult Health

## 2024-02-24 ENCOUNTER — Encounter (HOSPITAL_COMMUNITY): Payer: Self-pay | Admitting: Orthopaedic Surgery

## 2024-02-24 DIAGNOSIS — R300 Dysuria: Secondary | ICD-10-CM | POA: Diagnosis not present

## 2024-02-24 DIAGNOSIS — Z743 Need for continuous supervision: Secondary | ICD-10-CM | POA: Diagnosis not present

## 2024-02-24 DIAGNOSIS — R58 Hemorrhage, not elsewhere classified: Secondary | ICD-10-CM | POA: Diagnosis not present

## 2024-02-24 DIAGNOSIS — R5383 Other fatigue: Secondary | ICD-10-CM | POA: Diagnosis not present

## 2024-02-24 DIAGNOSIS — Z741 Need for assistance with personal care: Secondary | ICD-10-CM | POA: Diagnosis not present

## 2024-02-24 DIAGNOSIS — E039 Hypothyroidism, unspecified: Secondary | ICD-10-CM

## 2024-02-24 DIAGNOSIS — R4189 Other symptoms and signs involving cognitive functions and awareness: Secondary | ICD-10-CM | POA: Diagnosis not present

## 2024-02-24 DIAGNOSIS — S72002D Fracture of unspecified part of neck of left femur, subsequent encounter for closed fracture with routine healing: Secondary | ICD-10-CM

## 2024-02-24 DIAGNOSIS — K219 Gastro-esophageal reflux disease without esophagitis: Secondary | ICD-10-CM | POA: Insufficient documentation

## 2024-02-24 DIAGNOSIS — M6281 Muscle weakness (generalized): Secondary | ICD-10-CM | POA: Diagnosis not present

## 2024-02-24 DIAGNOSIS — J3089 Other allergic rhinitis: Secondary | ICD-10-CM | POA: Diagnosis not present

## 2024-02-24 DIAGNOSIS — E440 Moderate protein-calorie malnutrition: Secondary | ICD-10-CM | POA: Diagnosis not present

## 2024-02-24 DIAGNOSIS — D62 Acute posthemorrhagic anemia: Secondary | ICD-10-CM | POA: Insufficient documentation

## 2024-02-24 DIAGNOSIS — Z96653 Presence of artificial knee joint, bilateral: Secondary | ICD-10-CM | POA: Diagnosis not present

## 2024-02-24 DIAGNOSIS — S72142A Displaced intertrochanteric fracture of left femur, initial encounter for closed fracture: Secondary | ICD-10-CM | POA: Diagnosis not present

## 2024-02-24 DIAGNOSIS — S72142D Displaced intertrochanteric fracture of left femur, subsequent encounter for closed fracture with routine healing: Secondary | ICD-10-CM | POA: Diagnosis not present

## 2024-02-24 DIAGNOSIS — R29818 Other symptoms and signs involving the nervous system: Secondary | ICD-10-CM | POA: Diagnosis not present

## 2024-02-24 DIAGNOSIS — R030 Elevated blood-pressure reading, without diagnosis of hypertension: Secondary | ICD-10-CM | POA: Diagnosis not present

## 2024-02-24 DIAGNOSIS — Z4789 Encounter for other orthopedic aftercare: Secondary | ICD-10-CM | POA: Diagnosis not present

## 2024-02-24 DIAGNOSIS — E785 Hyperlipidemia, unspecified: Secondary | ICD-10-CM | POA: Diagnosis not present

## 2024-02-24 DIAGNOSIS — Z9181 History of falling: Secondary | ICD-10-CM | POA: Diagnosis not present

## 2024-02-24 DIAGNOSIS — R262 Difficulty in walking, not elsewhere classified: Secondary | ICD-10-CM | POA: Diagnosis not present

## 2024-02-24 LAB — COMPREHENSIVE METABOLIC PANEL WITH GFR
ALT: 20 U/L (ref 0–44)
AST: 30 U/L (ref 15–41)
Albumin: 2.6 g/dL — ABNORMAL LOW (ref 3.5–5.0)
Alkaline Phosphatase: 42 U/L (ref 38–126)
Anion gap: 10 (ref 5–15)
BUN: 13 mg/dL (ref 8–23)
CO2: 27 mmol/L (ref 22–32)
Calcium: 8.9 mg/dL (ref 8.9–10.3)
Chloride: 103 mmol/L (ref 98–111)
Creatinine, Ser: 0.85 mg/dL (ref 0.44–1.00)
GFR, Estimated: >60 mL/min (ref >60)
Glucose, Bld: 121 mg/dL — ABNORMAL HIGH (ref 70–99)
Potassium: 3.9 mmol/L (ref 3.5–5.1)
Sodium: 140 mmol/L (ref 135–145)
Total Bilirubin: 1.2 mg/dL (ref 0.0–1.2)
Total Protein: 5.8 g/dL — ABNORMAL LOW (ref 6.5–8.1)

## 2024-02-24 LAB — CBC
HCT: 27 % — ABNORMAL LOW (ref 36.0–46.0)
Hemoglobin: 9 g/dL — ABNORMAL LOW (ref 12.0–15.0)
MCH: 30 pg (ref 26.0–34.0)
MCHC: 33.3 g/dL (ref 30.0–36.0)
MCV: 90 fL (ref 80.0–100.0)
Platelets: 267 K/uL (ref 150–400)
RBC: 3 MIL/uL — ABNORMAL LOW (ref 3.87–5.11)
RDW: 13.4 % (ref 11.5–15.5)
WBC: 8.5 K/uL (ref 4.0–10.5)
nRBC: 0 % (ref 0.0–0.2)

## 2024-02-24 LAB — MAGNESIUM: Magnesium: 2.1 mg/dL (ref 1.7–2.4)

## 2024-02-24 MED ORDER — FERROUS SULFATE 325 (65 FE) MG PO TABS: 325.0000 mg | ORAL_TABLET | Freq: Every day | ORAL | Status: DC

## 2024-02-24 MED ORDER — OXYCODONE HCL 5 MG PO TABS: 5.0000 mg | ORAL_TABLET | Freq: Four times a day (QID) | ORAL | 0 refills | Status: DC | PRN

## 2024-02-24 MED ORDER — ASPIRIN 325 MG PO TABS: 325.0000 mg | ORAL_TABLET | Freq: Every day | ORAL | Status: AC

## 2024-02-24 NOTE — Progress Notes (Signed)
 AVS completed for discharge packet and placed with chart.

## 2024-02-24 NOTE — Progress Notes (Signed)
 Physical Therapy Treatment Patient Details Name: Donna Daniels MRN: [id number] DOB: Sep 05, 1940 Today's Date: 02/24/2024   History of Present Illness Pt is an 83 y/o female who presents 02/19/2024 s/p fall in garden, sustaining a L hip fracture. She is now s/p L cephalomedullary nailing on 02/20/2024. PMH significant for Hypothyroidism, joint pain/swelling, ACDF 2013, L hand surgery, thyroidectomy, B TKA.    PT Comments  Pt greeted supine in bed, pleasant and agreeable to PT session. She is making steady progress towards her acute PT goals. Pt required less physical assist with transfers. She continues to require +2 assist with ambulation for safety d/t fatigue. Pt demonstrated a step-to antalgic gait pattern with a NBOS. Multi-modal cues for improved technique with limited to no adjustments noted. Will continue to follow acutely and advance appropriately.      If plan is discharge home, recommend the following: A lot of help with walking and/or transfers;A lot of help with bathing/dressing/bathroom;Assistance with cooking/housework;Assist for transportation;Help with stairs or ramp for entrance   Can travel by private vehicle     No  Equipment Recommendations  None recommended by PT (Pt reports she can get DME from mother's house where it is currently being stored)    Recommendations for Other Services       Precautions / Restrictions Precautions Precautions: Fall Recall of Precautions/Restrictions: Intact Restrictions Weight Bearing Restrictions Per Provider Order: Yes LLE Weight Bearing Per Provider Order: Weight bearing as tolerated     Mobility  Bed Mobility Overal bed mobility: Needs Assistance Bed Mobility: Supine to Sit     Supine to sit: Mod assist, HOB elevated, Used rails     General bed mobility comments: Pt sat up on R side of bed with increased time. Cues for sequencing. Assist to manage LLE, elevate trunk, and scoot fwd to EOB.    Transfers Overall transfer  level: Needs assistance Equipment used: Rolling walker (2 wheels) Transfers: Sit to/from Stand, Bed to chair/wheelchair/BSC Sit to Stand: From elevated surface, Min assist Stand pivot transfers: From elevated surface, Min assist         General transfer comment: Pt stood from slightly raised bed height. She demonstrated proper hand placement using RW. Powered up with minA. Transferred to recliner chair on right. Fair eccentric control, limited likely d/t fatigue.    Ambulation/Gait Ambulation/Gait assistance: Min assist, +2 safety/equipment (Chair Follow) Gait Distance (Feet): 30 Feet Assistive device: Rolling walker (2 wheels) Gait Pattern/deviations: Step-to pattern, Narrow base of support, Trunk flexed, Antalgic, Decreased step length - right, Decreased step length - left, Decreased stride length, Decreased dorsiflexion - right, Decreased dorsiflexion - left Gait velocity: Decreased     General Gait Details: Pt took short, slow, labored steps with heavy reliance on BUE support on RW. She maintained a NBOS with limited foot clearence. Cued pt to increased foot positioning to shoulder width apart, increase step length, and incresae upright posture. Limited to no corrections noted. Pt fatigues quickly. Cued PLB technique.   Stairs             Wheelchair Mobility     Tilt Bed    Modified Rankin (Stroke Patients Only)       Balance Overall balance assessment: Needs assistance Sitting-balance support: Feet supported, No upper extremity supported Sitting balance-Leahy Scale: Fair     Standing balance support: Bilateral upper extremity supported, During functional activity, Reliant on assistive device for balance Standing balance-Leahy Scale: Poor Standing balance comment: Pt dependent on RW  Communication Communication Communication: Impaired Factors Affecting Communication: Hearing impaired  Cognition Arousal: Alert Behavior  During Therapy: WFL for tasks assessed/performed   PT - Cognitive impairments: No apparent impairments                         Following commands: Intact      Cueing Cueing Techniques: Verbal cues, Tactile cues, Visual cues  Exercises      General Comments General comments (skin integrity, edema, etc.): VSS on RA      Pertinent Vitals/Pain Pain Assessment Pain Assessment: Faces Faces Pain Scale: Hurts little more Pain Location: L hip Pain Descriptors / Indicators: Grimacing, Guarding, Discomfort, Aching Pain Intervention(s): Monitored during session, Limited activity within patient's tolerance, Repositioned    Home Living                          Prior Function            PT Goals (current goals can now be found in the care plan section) Acute Rehab PT Goals Patient Stated Goal: Go to rehab Progress towards PT goals: Progressing toward goals    Frequency    Min 2X/week      PT Plan      Co-evaluation              AM-PAC PT 6 Clicks Mobility   Outcome Measure  Help needed turning from your back to your side while in a flat bed without using bedrails?: A Little Help needed moving from lying on your back to sitting on the side of a flat bed without using bedrails?: A Lot Help needed moving to and from a bed to a chair (including a wheelchair)?: A Little Help needed standing up from a chair using your arms (e.g., wheelchair or bedside chair)?: A Little Help needed to walk in hospital room?: Total Help needed climbing 3-5 steps with a railing? : Total 6 Click Score: 13    End of Session Equipment Utilized During Treatment: Gait belt Activity Tolerance: Patient tolerated treatment well;Patient limited by fatigue Patient left: in chair;with call bell/phone within reach;with chair alarm set Nurse Communication: Mobility status PT Visit Diagnosis: Unsteadiness on feet (R26.81);Pain;Difficulty in walking, not elsewhere classified  (R26.2) Pain - Right/Left: Left Pain - part of body: Hip     Time: 0812-0832 PT Time Calculation (min) (ACUTE ONLY): 20 min  Charges:    $Gait Training: 8-22 mins PT General Charges $$ ACUTE PT VISIT: 1 Visit                     Randall SAUNDERS, PT, DPT Acute Rehabilitation Services Office: [phone] Secure Chat Preferred  Delon CHRISTELLA Callander 02/24/2024, 9:11 AM

## 2024-02-24 NOTE — TOC Transition Note (Signed)
 Transition of Care [facility]) - Discharge Note   Patient Details  Name: Donna Daniels MRN: [id number] Date of Birth: 1941-06-18  Transition of [facility]) CM/SW Contact:  Bridget Cordella Simmonds, LCSW Phone Number: 02/24/2024, 10:14 AM   Clinical Narrative:   Pt discharging to [facility]. RN call report to [phone].  PTAR called [phone].   0830: CSW confirmed with [facility] that they can receive pt today.   Final next level of care: Skilled Nursing Facility Barriers to Discharge: Barriers Resolved   Patient Goals and CMS Choice Patient states their goals for this hospitalization and ongoing recovery are:: be back like I was CMS Medicare.gov Compare Post Acute Care list provided to:: Patient Choice offered to / list presented to : Patient      Discharge Placement              Patient chooses bed at: [facility] Patient to be transferred to facility by: ptar Name of family member notified: daughter Delon Patient and family notified of of transfer: 02/24/24  Discharge Plan and Services Additional resources added to the After Visit Summary for   In-house Referral: Clinical Social Work   Post Acute Care Choice: Skilled Nursing Facility                               Social Drivers of Health (SDOH) Interventions SDOH Screenings   Food Insecurity: No Food Insecurity (02/19/2024)  Housing: Low Risk  (02/19/2024)  Transportation Needs: No Transportation Needs (02/19/2024)  Utilities: Not At Risk (02/19/2024)  Social Connections: Unknown (02/19/2024)  Tobacco Use: Low Risk  (02/20/2024)     Readmission Risk Interventions     No data to display

## 2024-02-24 NOTE — Discharge Summary (Signed)
 Physician Discharge Summary  Donna Daniels FMW:[id number] DOB: Nov 08, 1940 DOA: 02/19/2024  PCP: Marvine Rush, MD  Admit date: 02/19/2024 Discharge date: 02/24/2024  Admitted From: home Disposition:  SNF  Recommendations for Outpatient Follow-up:  Follow up with PCP in 1-2 weeks Please obtain BMP/CBC in one week Follow-up with orthopedic surgery in 1 to 2 weeks as an outpatient  Home Health: none Equipment/Devices: none  Discharge Condition: stable CODE STATUS: Full code Diet Orders (From admission, onward)     Start     Ordered   02/20/24 1052  Diet regular Room service appropriate? Yes; Fluid consistency: Thin  Diet effective now       Question Answer Comment  Room service appropriate? Yes   Fluid consistency: Thin      02/20/24 1051            HPI: Per admitting MD, Donna Daniels is a 83 y.o. female with medical history significant of hypothyroidism, hyperlipidemia, history of bilateral knee replacement.  Patient had a fall earlier today while in the garden.  She became off balance and fell on her right hip, having immediate pain in the left hip and was unable to move it.  A neighbor saw her and came to her aid.  EMS was called and she was transported to the hospital.  No fevers, chills, nausea, vomiting.  Denies chest pain, shortness of breath.  She is normally able to ambulate without too much difficulty.  No chest pain or shortness of breath with activity.   Hospital Course / Discharge diagnoses: Principal Problem:   Closed left hip fracture [facility]) Active Problems:   High cholesterol   Hypothyroidism   Principal problem Closed left hip fracture -orthopedic surgery consulted, she underwent intertrochanteric IM rod fixation with Dr. Elsa on 8/24. Continue pain management, DVT prophylaxis as per orthopedics with full dose aspirin .  PT evaluated patient, recommending SNF, she will be discharged in stable condition.  Continue pain control   Active  problems ABLA - Suspect in the setting of hip fracture/surgery, hemoglobin now stabilized and improving on its own.  No bleeding Brief hypotension perioperatively - BP overall acceptable Hyperlipidemia - Continue statin Hypothyroidism - Continue levothyroxine   Sepsis ruled out   Discharge Instructions   Allergies as of 02/24/2024   No Known Allergies      Medication List     TAKE these medications    aspirin  325 MG tablet Commonly known as: Bayer Aspirin  Take 1 tablet (325 mg total) by mouth daily for 28 days.   calcium -vitamin D 250-125 MG-UNIT tablet Commonly known as: OSCAL Take 2 tablets by mouth daily.   cholecalciferol  1000 units tablet Commonly known as: VITAMIN D Take 1,000 Units by mouth in the morning and at bedtime.   cyclobenzaprine  10 MG tablet Commonly known as: FLEXERIL  Take 10 mg by mouth 3 (three) times daily as needed for muscle spasms.   ferrous sulfate  325 (65 FE) MG tablet Take 1 tablet (325 mg total) by mouth daily with breakfast.   Fish Oil 1000 MG Caps Take 1 capsule by mouth daily.   levocetirizine 5 MG tablet Commonly known as: XYZAL Take 5 mg by mouth daily.   levothyroxine  88 MCG tablet Commonly known as: SYNTHROID  Take 88 mcg by mouth daily.   meloxicam 15 MG tablet Commonly known as: MOBIC Take 15 mg by mouth daily as needed for pain.   multivitamin with minerals Tabs tablet Take 1 tablet by mouth daily.   omeprazole 20 MG  capsule Commonly known as: PRILOSEC Take 20 mg by mouth daily.   oxyCODONE  5 MG immediate release tablet Commonly known as: Oxy IR/ROXICODONE  Take 1 tablet (5 mg total) by mouth every 6 (six) hours as needed for severe pain (pain score 7-10).   rosuvastatin  20 MG tablet Commonly known as: CRESTOR  Take 20 mg by mouth at bedtime.        Contact information for follow-up providers     Elsa Lonni SAUNDERS, MD Follow up in 2 week(s).   Specialty: Orthopedic Surgery Contact information: [address] [place] [address] [phone]              Contact information for after-discharge care     Destination     [facility] .   Service: Skilled Nursing Contact information: [address] [place]  [address] [phone]                     Consultations: Orthopedics surgery   Procedures/Studies:  DG HIP UNILAT WITH PELVIS 2-3 VIEWS LEFT Result Date: 02/20/2024 CLINICAL DATA:  Left intertrochanteric fracture. EXAM: DG HIP (WITH OR WITHOUT PELVIS) 2-3V LEFT COMPARISON:  Radiograph dated 02/19/2024. FINDINGS: Two intraoperative fluoroscopic spot images provided. The total fluoroscopic time is 1 minutes 13 seconds with a cumulative air Karma 11.6 mGy. IMPRESSION: Fluoroscopic images of the left hip. Electronically Signed   By: Vanetta Chou M.D.   On: 02/20/2024 13:34   DG C-Arm 1-60 Min-No Report Result Date: 02/20/2024 Fluoroscopy was utilized by the requesting physician.  No radiographic interpretation.   DG Chest Port 1 View Result Date: 02/19/2024 CLINICAL DATA:  Preop chest radiograph. EXAM: PORTABLE CHEST 1 VIEW COMPARISON:  Chest radiograph dated 04/13/2013 FINDINGS: No focal consolidation, pleural effusion or pneumothorax. The cardiac silhouette is within normal limits. No acute osseous pathology. IMPRESSION: No active disease. Electronically Signed   By: Vanetta Chou M.D.   On: 02/19/2024 16:22   DG Hip Unilat With Pelvis 2-3 Views Left Result Date: 02/19/2024 CLINICAL DATA:  Pain after a fall onto the left hip. EXAM: DG HIP (WITH OR WITHOUT PELVIS) 2-3V LEFT COMPARISON:  None Available. FINDINGS: Comminuted inter trochanteric fractures of the left proximal femur with varus angulation of the fracture fragments and displacement of the lesser trochanteric fragment. No dislocation at the hip joint. Degenerative changes in the lower lumbar spine and in both hips. Pelvis appears intact. SI joints and symphysis pubis are not  displaced. Soft tissues are unremarkable. IMPRESSION: Acute comminuted inter trochanteric fracture of the left proximal femur with varus angulation. Electronically Signed   By: Elsie Gravely M.D.   On: 02/19/2024 16:21     Subjective: - no chest pain, shortness of breath, no abdominal pain, nausea or vomiting.   Discharge Exam: BP (!) 123/55 (BP Location: Left Arm)   Pulse 64   Temp 98.4 F (36.9 C)   Resp 17   Ht 5' 6 (1.676 m)   Wt 76.9 kg   SpO2 95%   BMI 27.36 kg/m   General: Pt is alert, awake, not in acute distress Cardiovascular: RRR, S1/S2 +, no rubs, no gallops Respiratory: CTA bilaterally, no wheezing, no rhonchi Abdominal: Soft, NT, ND, bowel sounds + Extremities: no edema, no cyanosis    The results of significant diagnostics from this hospitalization (including imaging, microbiology, ancillary and laboratory) are listed below for reference.     Microbiology: Recent Results (from the past 240 hours)  Surgical pcr screen  Status: None   Collection Time: 02/20/24  5:15 AM   Specimen: Nasal Mucosa; Nasal Swab  Result Value Ref Range Status   MRSA, PCR NEGATIVE NEGATIVE Final   Staphylococcus aureus NEGATIVE NEGATIVE Final    Comment: (NOTE) The Xpert SA Assay (FDA approved for NASAL specimens in patients 33 years of age and older), is one component of a comprehensive surveillance program. It is not intended to diagnose infection nor to guide or monitor treatment. Performed at [facility], [address], [place], [place] [address]      Labs: Basic Metabolic Panel: Recent Labs  Lab 02/19/24 1541 02/21/24 0627 02/22/24 0601 02/23/24 0509 02/24/24 0457  NA 139 135 135 137 140  K 3.8 4.5 4.0 3.7 3.9  CL 101 101 100 101 103  CO2 25 27 28 26 27   GLUCOSE 131* 140* 129* 121* 121*  BUN 16 14 20 16 13   CREATININE 0.95 0.92 0.89 0.83 0.85  CALCIUM  9.2 9.1 8.8* 8.7* 8.9  MG  --   --   --   --  2.1   Liver Function Tests: Recent Labs  Lab  02/19/24 1541 02/24/24 0457  AST 22 30  ALT 15 20  ALKPHOS 55 42  BILITOT 0.9 1.2  PROT 6.7 5.8*  ALBUMIN  3.7 2.6*   CBC: Recent Labs  Lab 02/19/24 1541 02/21/24 0627 02/22/24 0601 02/23/24 0509 02/24/24 0457  WBC 9.0 13.5* 11.6* 8.7 8.5  NEUTROABS 5.7  --   --   --   --   HGB 12.4 9.4* 8.8* 8.5* 9.0*  HCT 38.3 28.3* 26.4* 25.3* 27.0*  MCV 91.8 91.3 89.5 89.4 90.0  PLT 232 190 195 209 267   CBG: No results for input(s): GLUCAP in the last 168 hours. Hgb A1c No results for input(s): HGBA1C in the last 72 hours. Lipid Profile No results for input(s): CHOL, HDL, LDLCALC, TRIG, CHOLHDL, LDLDIRECT in the last 72 hours. Thyroid  function studies No results for input(s): TSH, T4TOTAL, T3FREE, THYROIDAB in the last 72 hours.  Invalid input(s): FREET3 Urinalysis    Component Value Date/Time   COLORURINE AMBER (A) 07/17/2013 1216   APPEARANCEUR CLEAR 07/17/2013 1216   LABSPEC 1.029 07/17/2013 1216   PHURINE 6.0 07/17/2013 1216   GLUCOSEU NEGATIVE 07/17/2013 1216   HGBUR NEGATIVE 07/17/2013 1216   BILIRUBINUR SMALL (A) 07/17/2013 1216   KETONESUR 15 (A) 07/17/2013 1216   PROTEINUR NEGATIVE 07/17/2013 1216   UROBILINOGEN 1.0 07/17/2013 1216   NITRITE NEGATIVE 07/17/2013 1216   LEUKOCYTESUR SMALL (A) 07/17/2013 1216    FURTHER DISCHARGE INSTRUCTIONS:   Get Medicines reviewed and adjusted: Please take all your medications with you for your next visit with your Primary MD   Laboratory/radiological data: Please request your Primary MD to go over all hospital tests and procedure/radiological results at the follow up, please ask your Primary MD to get all Hospital records sent to his/her office.   In some cases, they will be blood work, cultures and biopsy results pending at the time of your discharge. Please request that your primary care M.D. goes through all the records of your hospital data and follows up on these results.   Also Note the  following: If you experience worsening of your admission symptoms, develop shortness of breath, life threatening emergency, suicidal or homicidal thoughts you must seek medical attention immediately by calling 911 or calling your MD immediately  if symptoms less severe.   You must read complete instructions/literature along with all the  possible adverse reactions/side effects for all the Medicines you take and that have been prescribed to you. Take any new Medicines after you have completely understood and accpet all the possible adverse reactions/side effects.    Do not drive when taking Pain medications or sleeping medications (Benzodaizepines)   Do not take more than prescribed Pain, Sleep and Anxiety Medications. It is not advisable to combine anxiety,sleep and pain medications without talking with your primary care practitioner   Special Instructions: If you have smoked or chewed Tobacco  in the last 2 yrs please stop smoking, stop any regular Alcohol  and or any Recreational drug use.   Wear Seat belts while driving.   Please note: You were cared for by a hospitalist during your hospital stay. Once you are discharged, your primary care physician will handle any further medical issues. Please note that NO REFILLS for any discharge medications will be authorized once you are discharged, as it is imperative that you return to your primary care physician (or establish a relationship with a primary care physician if you do not have one) for your post hospital discharge needs so that they can reassess your need for medications and monitor your lab values.  Time coordinating discharge: 40 minutes  SIGNED:  Nilda Fendt, MD, PhD 02/24/2024, 7:21 AM

## 2024-02-24 NOTE — Progress Notes (Signed)
 Location:  [facility] Room Number: 128 Place of Service:  SNF (31)   CODE STATUS: full   No Known Allergies  Chief Complaint  Patient presents with   Hospitalization Follow-up    HPI:  She is a 83 year old woman who has been hospitalized from 02-19-24 through 02-24-24. Her past medical history includes: hypothyroidism; gerd; bilateral knee replacements. She presented to the ED following a fall outside; having left hip pain. She was found to have a femur fracture and had an IM rod fixation on 02-20-24. She did have ABLA. Her hgb has improved without intervention. She is here for short term rehab with her goal to return back home. She is having nausea after taking oxycodone  for pain. She will continue to be followed for her chronic illnesses including:  Acquired hypothyroidism:  GERD without esophagitis: Acute blood loss anemia (ABLA)   Past Medical History:  Diagnosis Date   Arthritis    Bruises easily    Cataracts, bilateral    High cholesterol    takes SImvastin  daily   History of bronchitis    last time about 78yrs ago   Hypothyroidism    takes Synthroid  daily   Joint pain    Joint swelling    PONV (postoperative nausea and vomiting)    Sciatica    Thyroid  disorder     Past Surgical History:  Procedure Laterality Date   ABDOMINAL HYSTERECTOMY     ANTERIOR CERVICAL DECOMP/DISCECTOMY FUSION  12/18/2011   Procedure: ANTERIOR CERVICAL DECOMPRESSION/DISCECTOMY FUSION 2 LEVELS;  Surgeon: Darina MALVA Boehringer, MD;  Location: [facility];  Service: Neurosurgery;  Laterality: N/A;  Anterior cervical decompression fusion four-six   CESAREAN SECTION     x 1    COLONOSCOPY     HAND SURGERY Left    INTRAMEDULLARY (IM) NAIL INTERTROCHANTERIC Left 02/20/2024   Procedure: FIXATION, FRACTURE, INTERTROCHANTERIC, WITH INTRAMEDULLARY ROD;  Surgeon: Elsa Lonni SAUNDERS, MD;  Location: [facility] OR;  Service: Orthopedics;  Laterality: Left;   THYROIDECTOMY     TOTAL KNEE  ARTHROPLASTY Right 04/18/2013   Procedure: TOTAL KNEE ARTHROPLASTY;  Surgeon: Maude KANDICE Herald, MD;  Location: [facility] OR;  Service: Orthopedics;  Laterality: Right;   TOTAL KNEE ARTHROPLASTY Left 07/25/2013   Procedure: TOTAL KNEE ARTHROPLASTY;  Surgeon: Maude KANDICE Herald, MD;  Location: [facility] OR;  Service: Orthopedics;  Laterality: Left;   VESICOVAGINAL FISTULA CLOSURE W/ TAH      Social History   Socioeconomic History   Marital status: Widowed    Spouse name: Not on file   Number of children: Not on file   Years of education: Not on file   Highest education level: Not on file  Occupational History   Occupation: packaging   Tobacco Use   Smoking status: Never   Smokeless tobacco: Never  Substance and Sexual Activity   Alcohol use: No   Drug use: No   Sexual activity: Not on file  Other Topics Concern   Not on file  Social History Narrative   Not on file   Social Drivers of Health   Financial Resource Strain: Not on file  Food Insecurity: No Food Insecurity (02/19/2024)   Hunger Vital Sign    Worried About Running Out of Food in the Last Year: Never true    Ran Out of Food in the Last Year: Never true  Transportation Needs: No Transportation Needs (02/19/2024)   PRAPARE - Administrator, Civil Service (Medical): No  Lack of Transportation (Non-Medical): No  Physical Activity: Not on file  Stress: Not on file  Social Connections: Unknown (02/19/2024)   Social Connection and Isolation Panel    Frequency of Communication with Friends and Family: More than three times a week    Frequency of Social Gatherings with Friends and Family: Once a week    Attends Religious Services: More than 4 times per year    Active Member of [employer] or Organizations: Yes    Attends Banker Meetings: 1 to 4 times per year    Marital Status: Patient declined  Intimate Partner Violence: Unknown (02/19/2024)   Humiliation, Afraid, Rape, and Kick questionnaire    Fear of Current or  Ex-Partner: Patient declined    Emotionally Abused: No    Physically Abused: Patient declined    Sexually Abused: Patient declined   Family History  Problem Relation Age of Onset   Breast cancer Neg Hx       VITAL SIGNS BP 125/68   Pulse 78   Temp 98.2 F (36.8 C)   Resp 18   Ht 5' 6 (1.676 m)   Wt 169 lb (76.7 kg)   BMI 27.28 kg/m   Outpatient Encounter Medications as of 02/24/2024  Medication Sig   loratadine (CLARITIN) 10 MG tablet Take 10 mg by mouth daily.   vitamin E 180 MG (400 UNITS) capsule Take 400 Units by mouth daily.   aspirin  (BAYER ASPIRIN ) 325 MG tablet Take 1 tablet (325 mg total) by mouth daily for 28 days.   calcium -vitamin D (OSCAL) 250-125 MG-UNIT per tablet Take 2 tablets by mouth daily.   cholecalciferol  (VITAMIN D) 1000 UNITS tablet Take 1,000 Units by mouth in the morning and at bedtime.   cyclobenzaprine  (FLEXERIL ) 10 MG tablet Take 10 mg by mouth 3 (three) times daily as needed for muscle spasms.   ferrous sulfate  325 (65 FE) MG tablet Take 1 tablet (325 mg total) by mouth daily with breakfast.   levothyroxine  (SYNTHROID ) 88 MCG tablet Take 88 mcg by mouth daily.   meloxicam (MOBIC) 15 MG tablet Take 15 mg by mouth daily as needed for pain.   Multiple Vitamin (MULTIVITAMIN WITH MINERALS) TABS Take 1 tablet by mouth daily.   omeprazole (PRILOSEC) 20 MG capsule Take 20 mg by mouth daily.   oxyCODONE  (OXY IR/ROXICODONE ) 5 MG immediate release tablet Take 1 tablet (5 mg total) by mouth every 6 (six) hours as needed for severe pain (pain score 7-10).   rosuvastatin  (CRESTOR ) 20 MG tablet Take 20 mg by mouth at bedtime.      SIGNIFICANT DIAGNOSTIC EXAMS  TODAY  02-19-24: wbc 9.0; hgb 12.4; hct 38.3; mcv 91.8 plt 232; glucose 131; bun 16; creat 0.95; k+ 3.8; na++ 139; ca 9.6; gfr 59; protein 6.7 albumin  3.7 02-23-24: wbc 8.7; hgb 8.5; hct 25.3; mcv 89.4 plt 209; glucose 121; bun 16; creat 0.83; k+ 3.7; na++ 137; ca 8.7; gfr >60 02-24-24: wbc 8.5; hgb  9.0; hct 27.0; mcv 90.0 plt 267; glucose 121; bun 13; creat 0.85; k+ 3.9; na++ 140; ca 8.9; gfr >60; protein 5.8;na++ 2.6; mag 2.1   Review of Systems  Constitutional:  Negative for malaise/fatigue.  Respiratory:  Negative for cough and shortness of breath.   Cardiovascular:  Negative for chest pain, palpitations and leg swelling.  Gastrointestinal:  Positive for nausea. Negative for abdominal pain, constipation and heartburn.  Musculoskeletal:  Positive for joint pain. Negative for back pain and myalgias.  Left hip pain  Skin: Negative.   Neurological:  Negative for dizziness.  Psychiatric/Behavioral:  The patient is not nervous/anxious.     Physical Exam Constitutional:      General: She is not in acute distress.    Appearance: She is well-developed. She is not diaphoretic.  Neck:     Thyroid : No thyromegaly.  Cardiovascular:     Rate and Rhythm: Normal rate and regular rhythm.     Heart sounds: Normal heart sounds.  Pulmonary:     Effort: Pulmonary effort is normal. No respiratory distress.     Breath sounds: Normal breath sounds.  Abdominal:     General: Bowel sounds are normal. There is no distension.     Palpations: Abdomen is soft.     Tenderness: There is no abdominal tenderness.  Musculoskeletal:        General: Normal range of motion.     Cervical back: Neck supple.     Right lower leg: No edema.     Left lower leg: No edema.     Comments: Is status post IM rod fixation 02-20-24.   Lymphadenopathy:     Cervical: No cervical adenopathy.  Skin:    General: Skin is warm and dry.     Comments: Dressing to left hip intact  Neurological:     Mental Status: She is alert and oriented to person, place, and time.  Psychiatric:        Mood and Affect: Mood normal.      ASSESSMENT/ PLAN:  TODAY  Closed fracture of left hip with routine healing subsequent encounter: will continue therapy as directed to improve upon her level of independence with her adls. Will  follow up with orthopedics as indicated. Will continue asa 325 mg daily for 28 days; will continue mobic daily as needed; will continue oxycodone  5 mg every 6 hours as needed; flexeril  10 mg three times daily as needed.   2. Acquired hypothyroidism: will continue synthroid  88 mcg daily   3. GERD without esophagitis: will continue prilosec 20 mg daily  4. Acute blood loss anemia (ABLA): hgb 9.0; will continue iron daily  5. Moderate protein calorie malnutrition: albumin  2.6 will begin prosource twice daily   6. Chronic nonseasonal allergic rhinitis: will continue claritin 10 mg daily  7. Hyperlipidemia: will continue crestor  20 mg daily     Barnie Seip NP [facility]  call [phone]

## 2024-02-25 ENCOUNTER — Non-Acute Institutional Stay (SKILLED_NURSING_FACILITY): Payer: Self-pay | Admitting: Internal Medicine

## 2024-02-25 ENCOUNTER — Encounter: Payer: Self-pay | Admitting: Internal Medicine

## 2024-02-25 DIAGNOSIS — S72002D Fracture of unspecified part of neck of left femur, subsequent encounter for closed fracture with routine healing: Secondary | ICD-10-CM | POA: Diagnosis not present

## 2024-02-25 DIAGNOSIS — E440 Moderate protein-calorie malnutrition: Secondary | ICD-10-CM

## 2024-02-25 DIAGNOSIS — R030 Elevated blood-pressure reading, without diagnosis of hypertension: Secondary | ICD-10-CM | POA: Diagnosis not present

## 2024-02-25 DIAGNOSIS — D62 Acute posthemorrhagic anemia: Secondary | ICD-10-CM

## 2024-02-25 NOTE — Assessment & Plan Note (Addendum)
 Current albumin  2.6 & total protein 5.8. IOW present on exam.  She is on protein supplement. Nutrionist to assess @ SNF.

## 2024-02-25 NOTE — Assessment & Plan Note (Signed)
 PT/OT @ SNF as tolerated.

## 2024-02-25 NOTE — Assessment & Plan Note (Addendum)
 She is on supplemental oral iron. No bleeding dyscrasias reported; continue monitor.

## 2024-02-25 NOTE — Patient Instructions (Signed)
 See assessment and plan under each diagnosis in the problem list and acutely for this visit

## 2024-02-25 NOTE — Progress Notes (Signed)
 NURSING HOME LOCATION:  [facility] ROOM NUMBER:  128 P  CODE STATUS:  Full Code  PCP:  Norleen General MD  This is a comprehensive admission note to this SNFperformed on this date less than 30 days from date of admission. Included are preadmission medical/surgical history; reconciled medication list; family history; social history and comprehensive review of systems.  Corrections and additions to the records were documented. Comprehensive physical exam was also performed. Additionally a clinical summary was entered for each active diagnosis pertinent to this admission in the Problem List to enhance continuity of care.  HPI:She was hospitalized 8/23 -02/24/2024 for closed  L hip fracture sustained in an apparent mechanical fall. Apparently she was on the ground in her garden for 2 hours until a neighbor heard her calling out. IM rod fixation was completed 8/20 by Dr Elsa. DVT prophylaxis post op was with 325 mg ASA every day.Initial H/H was 12.4/38.3 with nadir values 8.5/25.3 & final values 9/27.Albumin  was 2.6 & total protein 5.8. 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 PT/OT consulted & recommended SNF for rehab.  Past medical and surgical history: includes dyslipidemia;hypothyroidism;sciatica;& GERD. Surgeries include TAH;cervical disc surgery; colonoscopy; & bilateral TKA.  Family history: reviewed, non contributory due to advanced age.  Social history:non drinker; non smoker.   Review of systems: She denies cardiac or neuro prodrome prior to fall ,she questions whether she tripped over a  root while watering her garden plants. Pain control is good; she describes only being sore & stiff.. Insomnia is blamed on positioning in the hospital bed. She , a brother & a sister all have essential tremor.Her sister has had good response to propranolol.  Constitutional: No fever, significant weight change, fatigue  Eyes: No redness, discharge, pain, vision  change ENT/mouth: No nasal congestion, purulent discharge, earache, change in hearing, sore throat  Cardiovascular: No chest pain, palpitations, paroxysmal nocturnal dyspnea, claudication, edema  Respiratory: No cough, sputum production, hemoptysis, DOE, significant snoring, apnea Gastrointestinal: No heartburn, dysphagia, abdominal pain, nausea /vomiting, rectal bleeding, melena, change in bowels Genitourinary: No dysuria, hematuria, pyuria, incontinence, nocturia Dermatologic: No rash, pruritus, change in appearance of skin Neurologic: No dizziness, headache, syncope, seizures, numbness, tingling Psychiatric: No significant anxiety, depression, anorexia Endocrine: No change in hair/skin/nails, excessive thirst, excessive hunger, excessive urination  Hematologic/lymphatic: No significant bruising, lymphadenopathy, abnormal bleeding Allergy/immunology: No itchy/watery eyes, significant sneezing, urticaria, angioedema  Physical exam:  Pertinent or positive findings:She appears her age. Bilateral hearing aids in place.Her voice is slightly harsh & weak.Slight tachycardia & slightly irregular rhythm due to occasional premature beats present. Breath sounds decreased. Abdomen somewhat protuberant.Trace edema @ sockline. Pedal pulses surprisingly strong.IOW present.Slight coarse tremor of R hand intermittently present.Mixed arthritic changes of digits noted.  General appearance: no acute distress, increased work of breathing is present.   Lymphatic: No lymphadenopathy about the head, neck, axilla. Eyes: No conjunctival inflammation or lid edema is present. There is no scleral icterus. Ears:  External ear exam shows no significant lesions or deformities.   Nose:  External nasal examination shows no deformity or inflammation. Nasal mucosa are pink and moist without lesions, exudates Oral exam: Lips and gums are healthy appearing.There is no oropharyngeal erythema or exudate. Neck:  No thyromegaly,  masses, tenderness noted.    Heart:  No gallop, murmur, click, rub.  Lungs:  without wheezes, rhonchi, rales, rubs. Abdomen: Bowel sounds are normal.  Abdomen is soft and nontender with no organomegaly, hernias, masses. GU:  Deferred  Extremities:  No cyanosis, clubbing. Neurologic exam: Balance, Rhomberg, finger to nose testing could not be completed due to clinical state Skin: Warm & dry w/o tenting. No significant lesions or rash.  See clinical summary under each active problem in the Problem List with associated updated therapeutic plan

## 2024-03-02 ENCOUNTER — Encounter: Payer: Self-pay | Admitting: Internal Medicine

## 2024-03-02 ENCOUNTER — Other Ambulatory Visit (HOSPITAL_COMMUNITY)
Admission: RE | Admit: 2024-03-02 | Discharge: 2024-03-02 | Disposition: A | Discharge status: Home / Self Care | Source: Skilled Nursing Facility | Attending: Internal Medicine | Admitting: Internal Medicine

## 2024-03-02 ENCOUNTER — Non-Acute Institutional Stay (SKILLED_NURSING_FACILITY): Admitting: Internal Medicine

## 2024-03-02 DIAGNOSIS — R58 Hemorrhage, not elsewhere classified: Secondary | ICD-10-CM | POA: Insufficient documentation

## 2024-03-02 DIAGNOSIS — S72142D Displaced intertrochanteric fracture of left femur, subsequent encounter for closed fracture with routine healing: Secondary | ICD-10-CM | POA: Insufficient documentation

## 2024-03-02 DIAGNOSIS — R300 Dysuria: Secondary | ICD-10-CM | POA: Diagnosis not present

## 2024-03-02 LAB — CBC
HCT: 29.2 % — ABNORMAL LOW (ref 36.0–46.0)
Hemoglobin: 9.5 g/dL — ABNORMAL LOW (ref 12.0–15.0)
MCH: 30.4 pg (ref 26.0–34.0)
MCHC: 32.5 g/dL (ref 30.0–36.0)
MCV: 93.3 fL (ref 80.0–100.0)
Platelets: 548 K/uL — ABNORMAL HIGH (ref 150–400)
RBC: 3.13 MIL/uL — ABNORMAL LOW (ref 3.87–5.11)
RDW: 14.4 % (ref 11.5–15.5)
WBC: 12 K/uL — ABNORMAL HIGH (ref 4.0–10.5)
nRBC: 0 % (ref 0.0–0.2)

## 2024-03-02 NOTE — Progress Notes (Signed)
   NURSING HOME LOCATION:  [facility] ROOM NUMBER:  128 P  CODE STATUS: Full Code  PCP: Donna Rush, MD   This is a nursing facility follow up visit for specific acute issue of swelling @ L hip oerative site..  Interim medical record and care since last SNF visit was updated with review of diagnostic studies and change in clinical status since last visit were documented.  HPI:Her Nurse requested that I evaluate localized swelling at the superior excision of the 02/20/2024 intramedullary nailing operative site . The mass appeared soft without change in temperature, color or associated discharge or bleeding.Postop DVT prophylaxis is full dose aspirin  daily.  The most recent labs were performed 02/24/2024 and revealed protein/caloric malnutrition with an albumin  of 2.6 and total protein of 5.8.  Her normochromic, normocytic anemia had improved with H/H of 9.0/27.0.  Platelet count was 267,000.   Review of systems: She localizes the pain to the most superior of the operative sites.  She does describe some burning and stinging and questions whether she might have a yeast infection.  This has been a clinical picture for her in the past.  She denies flank pain, pyuria, frank dysuria, or hematuria.  She also denies any bleeding dyscrasias.   Physical exam:  Pertinent or positive findings: Exam was limited to the left buttocks and left lower extremity.  There is a slightly firm enlargement subcutaneously at the most superior operative site . This is associated with extensive ecchymosis which is most severe superiorly  over the lateral buttocks but extends as a more faint ecchymotic change as far as the left lateral knee area.See assessment and plan.

## 2024-03-02 NOTE — Assessment & Plan Note (Signed)
 CBC and differential.  Monitor for bleeding dyscrasias.  Orthopedic follow-up as scheduled.

## 2024-03-02 NOTE — Patient Instructions (Signed)
 See assessment and plan under each diagnosis in the problem list and acutely for this visit

## 2024-03-08 DIAGNOSIS — S72142A Displaced intertrochanteric fracture of left femur, initial encounter for closed fracture: Secondary | ICD-10-CM | POA: Diagnosis not present

## 2024-03-10 ENCOUNTER — Non-Acute Institutional Stay (SKILLED_NURSING_FACILITY): Payer: Self-pay | Admitting: Adult Health

## 2024-03-10 ENCOUNTER — Encounter: Payer: Self-pay | Admitting: Adult Health

## 2024-03-10 DIAGNOSIS — E440 Moderate protein-calorie malnutrition: Secondary | ICD-10-CM

## 2024-03-10 DIAGNOSIS — S72002D Fracture of unspecified part of neck of left femur, subsequent encounter for closed fracture with routine healing: Secondary | ICD-10-CM

## 2024-03-10 DIAGNOSIS — D62 Acute posthemorrhagic anemia: Secondary | ICD-10-CM | POA: Diagnosis not present

## 2024-03-10 NOTE — Progress Notes (Signed)
 Location:  [facility] Room Number: 128 P Place of Service:  SNF (31)   CODE STATUS: Full Code  No Known Allergies  Chief Complaint  Patient presents with   Care Plan Meeting    HPI:  We have come together for her care plan meeting. Family present. BIMS 15/15 mood 2/30: not sleeping well; nervous at times. Using a wheelchair without falls. She requires supervision with her adl care. She is occasionally incontinent of bladder and frequently incontinent of bowel. Dietary: regular diet; setup for meals; appetite 26-75% weight is 163 pounds. Therapy: ambulate  175 feet with rolling walker with supervision; upper body supervision; lower body contact guard; transfers supervision; brp: contact guard; car transfer supervision. SLUMS 20/30. Activities: keeps self busy. She will continue to be followed for her chronic illnesses including:  Closed fracture of left hip with routine healing subsequent encounter    Moderate protein calorie malnutrition   Acute blood loss anemia (ABLA)  Past Medical History:  Diagnosis Date   Arthritis    Bruises easily    Cataracts, bilateral    Closed fracture of patella 03/06/2010   Qualifier: Diagnosis of   By: Margrette MD, Taft         High cholesterol    takes SImvastin  daily   History of bronchitis    last time about 48yrs ago   Hypothyroidism    takes Synthroid  daily   Joint pain    Joint swelling    PONV (postoperative nausea and vomiting)    Sciatica    Thyroid  disorder     Past Surgical History:  Procedure Laterality Date   ABDOMINAL HYSTERECTOMY     ANTERIOR CERVICAL DECOMP/DISCECTOMY FUSION  12/18/2011   Procedure: ANTERIOR CERVICAL DECOMPRESSION/DISCECTOMY FUSION 2 LEVELS;  Surgeon: Darina MALVA Boehringer, MD;  Location: [facility];  Service: Neurosurgery;  Laterality: N/A;  Anterior cervical decompression fusion four-six   CESAREAN SECTION     x 1    COLONOSCOPY     HAND SURGERY Left    INTRAMEDULLARY (IM) NAIL  INTERTROCHANTERIC Left 02/20/2024   Procedure: FIXATION, FRACTURE, INTERTROCHANTERIC, WITH INTRAMEDULLARY ROD;  Surgeon: Elsa Lonni SAUNDERS, MD;  Location: [facility] OR;  Service: Orthopedics;  Laterality: Left;   THYROIDECTOMY     TOTAL KNEE ARTHROPLASTY Right 04/18/2013   Procedure: TOTAL KNEE ARTHROPLASTY;  Surgeon: Maude KANDICE Herald, MD;  Location: [facility] OR;  Service: Orthopedics;  Laterality: Right;   TOTAL KNEE ARTHROPLASTY Left 07/25/2013   Procedure: TOTAL KNEE ARTHROPLASTY;  Surgeon: Maude KANDICE Herald, MD;  Location: [facility] OR;  Service: Orthopedics;  Laterality: Left;   VESICOVAGINAL FISTULA CLOSURE W/ TAH      Social History   Socioeconomic History   Marital status: Widowed    Spouse name: Not on file   Number of children: Not on file   Years of education: Not on file   Highest education level: Not on file  Occupational History   Occupation: packaging   Tobacco Use   Smoking status: Never   Smokeless tobacco: Never  Substance and Sexual Activity   Alcohol use: No   Drug use: No   Sexual activity: Not on file  Other Topics Concern   Not on file  Social History Narrative   Not on file   Social Drivers of Health   Financial Resource Strain: Not on file  Food Insecurity: No Food Insecurity (02/19/2024)   Hunger Vital Sign    Worried About Running Out of Food in the  Last Year: Never true    Ran Out of Food in the Last Year: Never true  Transportation Needs: No Transportation Needs (02/19/2024)   PRAPARE - Administrator, Civil Service (Medical): No    Lack of Transportation (Non-Medical): No  Physical Activity: Not on file  Stress: Not on file  Social Connections: Unknown (02/19/2024)   Social Connection and Isolation Panel    Frequency of Communication with Friends and Family: More than three times a week    Frequency of Social Gatherings with Friends and Family: Once a week    Attends Religious Services: More than 4 times per year    Active Member of [employer] or  Organizations: Yes    Attends Banker Meetings: 1 to 4 times per year    Marital Status: Patient declined  Intimate Partner Violence: Unknown (02/19/2024)   Humiliation, Afraid, Rape, and Kick questionnaire    Fear of Current or Ex-Partner: Patient declined    Emotionally Abused: No    Physically Abused: Patient declined    Sexually Abused: Patient declined   Family History  Problem Relation Age of Onset   Breast cancer Neg Hx       VITAL SIGNS BP 134/74   Pulse 84   Temp 97.7 F (36.5 C)   Resp 20   Ht 5' 6 (1.676 m)   Wt 163 lb 9.6 oz (74.2 kg)   SpO2 95%   BMI 26.41 kg/m   Outpatient Encounter Medications as of 03/10/2024  Medication Sig   acetaminophen  (TYLENOL ) 500 MG tablet Take 500 mg by mouth every 6 (six) hours as needed.   aspirin  (BAYER ASPIRIN ) 325 MG tablet Take 1 tablet (325 mg total) by mouth daily for 28 days.   calcium -vitamin D (OSCAL) 250-125 MG-UNIT per tablet Take 2 tablets by mouth daily.   cholecalciferol  (VITAMIN D) 1000 UNITS tablet Take 1,000 Units by mouth in the morning and at bedtime.   cyclobenzaprine  (FLEXERIL ) 10 MG tablet Take 10 mg by mouth 3 (three) times daily as needed for muscle spasms.   ferrous sulfate  325 (65 FE) MG tablet Take 1 tablet (325 mg total) by mouth daily with breakfast.   levothyroxine  (SYNTHROID ) 88 MCG tablet Take 88 mcg by mouth daily.   meloxicam (MOBIC) 15 MG tablet Take 15 mg by mouth daily as needed for pain.   Multiple Vitamin (MULTIVITAMIN WITH MINERALS) TABS Take 1 tablet by mouth daily.   omeprazole (PRILOSEC) 20 MG capsule Take 20 mg by mouth daily.   ondansetron  (ZOFRAN ) 4 MG tablet Take 4 mg by mouth every 6 (six) hours as needed for nausea or vomiting.   Protein (PROSOURCE PO) Take 30 mLs by mouth 2 (two) times daily.   rosuvastatin  (CRESTOR ) 20 MG tablet Take 20 mg by mouth at bedtime.   vitamin E 180 MG (400 UNITS) capsule Take 400 Units by mouth daily.   loratadine (CLARITIN) 10 MG tablet  Take 10 mg by mouth daily. (Patient not taking: Reported on 03/10/2024)   oxyCODONE  (OXY IR/ROXICODONE ) 5 MG immediate release tablet Take 1 tablet (5 mg total) by mouth every 6 (six) hours as needed for severe pain (pain score 7-10). (Patient not taking: Reported on 03/10/2024)   No facility-administered encounter medications on file as of 03/10/2024.     SIGNIFICANT DIAGNOSTIC EXAMS  TODAY  02-19-24: wbc 9.0; hgb 12.4; hct 38.3; mcv 91.8 plt 232; glucose 131; bun 16; creat 0.95; k+ 3.8; na++ 139; ca 9.6; gfr 59;  protein 6.7 albumin  3.7 02-23-24: wbc 8.7; hgb 8.5; hct 25.3; mcv 89.4 plt 209; glucose 121; bun 16; creat 0.83; k+ 3.7; na++ 137; ca 8.7; gfr >60 02-24-24: wbc 8.5; hgb 9.0; hct 27.0; mcv 90.0 plt 267; glucose 121; bun 13; creat 0.85; k+ 3.9; na++ 140; ca 8.9; gfr >60; protein 5.8;na++ 2.6; mag 2.1  Review of Systems  Constitutional:  Negative for malaise/fatigue.  Respiratory:  Negative for cough and shortness of breath.   Cardiovascular:  Negative for chest pain, palpitations and leg swelling.  Gastrointestinal:  Negative for abdominal pain, constipation and heartburn.  Musculoskeletal:  Negative for back pain, joint pain and myalgias.  Skin: Negative.   Neurological:  Negative for dizziness.  Psychiatric/Behavioral:  The patient is not nervous/anxious.     Physical Exam Constitutional:      General: She is not in acute distress.    Appearance: She is well-developed. She is not diaphoretic.  Neck:     Thyroid : No thyromegaly.  Cardiovascular:     Rate and Rhythm: Normal rate and regular rhythm.     Heart sounds: Normal heart sounds.  Pulmonary:     Effort: Pulmonary effort is normal. No respiratory distress.     Breath sounds: Normal breath sounds.  Abdominal:     General: Bowel sounds are normal. There is no distension.     Palpations: Abdomen is soft.     Tenderness: There is no abdominal tenderness.  Musculoskeletal:        General: Normal range of motion.      Cervical back: Neck supple.     Right lower leg: No edema.     Left lower leg: No edema.     Comments:  Is status post IM rod fixation 02-20-24.    Lymphadenopathy:     Cervical: No cervical adenopathy.  Skin:    General: Skin is warm and dry.  Neurological:     Mental Status: She is alert and oriented to person, place, and time.  Psychiatric:        Mood and Affect: Mood normal.      ASSESSMENT/ PLAN:  TODAY  Closed fracture of left hip with routine healing subsequent encounter Moderate protein calorie malnutrition Acute blood loss anemia (ABLA)  Will continue current medications Will continue therapy as directed Will continue to monitor her status.  Goal is to return back home  Time spent with patient: 40 minutes: therapy; medications; dietary.    Barnie Seip NP [facility]   call [phone]

## 2024-03-16 ENCOUNTER — Encounter: Payer: Self-pay | Admitting: Internal Medicine

## 2024-03-16 ENCOUNTER — Non-Acute Institutional Stay (SKILLED_NURSING_FACILITY): Admitting: Internal Medicine

## 2024-03-16 DIAGNOSIS — S72002D Fracture of unspecified part of neck of left femur, subsequent encounter for closed fracture with routine healing: Secondary | ICD-10-CM | POA: Diagnosis not present

## 2024-03-16 DIAGNOSIS — E440 Moderate protein-calorie malnutrition: Secondary | ICD-10-CM | POA: Diagnosis not present

## 2024-03-16 DIAGNOSIS — R29818 Other symptoms and signs involving the nervous system: Secondary | ICD-10-CM | POA: Insufficient documentation

## 2024-03-16 DIAGNOSIS — D62 Acute posthemorrhagic anemia: Secondary | ICD-10-CM | POA: Diagnosis not present

## 2024-03-16 DIAGNOSIS — R4189 Other symptoms and signs involving cognitive functions and awareness: Secondary | ICD-10-CM

## 2024-03-16 NOTE — Progress Notes (Signed)
 NURSING HOME LOCATION:  [facility] ROOM NUMBER: 128 P   CODE STATUS:  Full Code  PCP: Marvine Rush, MD   This is a nursing facility follow up visit for discharge from [facility].  Interim medical record and care since last SNF visit was updated with review of diagnostic studies and change in clinical status since last visit were documented.  HPI: She was admitted to [facility] SNF for rehab on 8/28 after being hospitalized 8/23 - 02/24/2024 for closed left hip fracture sustained in an apparent mechanical fall.  She denied any neurologic or cardiac prodrome prior to the fall.  She believes she may have tripped over a root while watering her garden plants.  Apparently she was on the ground in her garden for several hours until her neighbor heard her calling out and called EMS.  Dr. Elsa completed IM rod fixation 8/20.  Postop DVT prophylaxis was with full dose aspirin  daily.  Preop H/H was 12.4/38.3; nadir values were 8.5/25.3 and final values 9/27.  Albumin  was 2.6 and total protein 5.8, suggesting protein/caloric malnutrition.  She has stage II CKD with GFR greater than 60. CBC was updated 9/4 and revealed slight improvement in her normochromic, normocytic anemia with H/H of 9.5/29.2. She participated with PT/OT.  At discharge she is able to ambulate 175 feet with a rolling walker.  She requires supervision and contact-guard with ADLs. SLUMS MMSE suggested neurocognitive deficit with a score of 20 out of 30.  Review of systems: She describes residual soreness at the operative site.  The associated bruising is improving.  Constitutional: No fever, significant weight change, fatigue  Eyes: No redness, discharge, pain, vision change ENT/mouth: No nasal congestion,  purulent discharge, earache, change in hearing, sore throat  Cardiovascular: No chest pain, palpitations, paroxysmal nocturnal dyspnea, claudication, edema  Respiratory: No cough, sputum production,  hemoptysis, DOE, significant snoring, apnea   Gastrointestinal: No heartburn, dysphagia, abdominal pain, nausea /vomiting, rectal bleeding, melena, change in bowels Genitourinary: No dysuria, hematuria, pyuria, incontinence, nocturia Dermatologic: No rash, pruritus, change in appearance of skin Neurologic: No dizziness, headache, syncope, seizures, numbness, tingling Psychiatric: No significant anxiety, depression, insomnia, anorexia Endocrine: No change in hair/skin/nails, excessive thirst, excessive hunger, excessive urination  Hematologic/lymphatic: No significant lymphadenopathy, abnormal bleeding Allergy/immunology: No itchy/watery eyes, significant sneezing, urticaria, angioedema  Physical exam:  Pertinent or positive findings: She appears her age.  She has an upper plate.  There is some misalignment of the mandibular teeth.  There is slight accentuation of S2.  She has trace edema at the right sock line and 1/2+ at the left sock line.  The right dorsalis pedis pulse is stronger than the other pulses.  The dorsalis pedis pulses are stronger than the posterior tibial pulses.  She has interosseous wasting of the hands and mixed DIP, PIP, and MCP arthritic changes.  She has bilateral fine tremor of the hands.  General appearance:  no acute distress, increased work of breathing is present.   Lymphatic: No lymphadenopathy about the head, neck, axilla. Eyes: No conjunctival inflammation or lid edema is present. There is no scleral icterus. Ears:  External ear exam shows no significant lesions or deformities.   Nose:  External nasal examination shows no deformity or inflammation. Nasal mucosa are pink and moist without lesions, exudates Oral exam:  Lips and gums are healthy appearing. There is no oropharyngeal erythema or exudate. Neck:  No thyromegaly, masses, tenderness noted.    Heart:  Normal rate and regular  rhythm without gallop, murmur, click, rub .  Lungs: Chest clear to auscultation  without wheezes, rhonchi, rales, rubs. Abdomen: Bowel sounds are normal. Abdomen is soft and nontender with no organomegaly, hernias, masses. GU: Deferred  Extremities:  No cyanosis, clubbing Neurologic exam :Balance, Rhomberg, finger to nose testing could not be completed due to clinical state Skin: Warm & dry w/o tenting. No significant  visible lesions or rash.  See summary under each active problem in the Problem List with associated updated therapeutic plan:  Closed left hip fracture [facility]) She participated with PT/OT at the SNF.  At discharge she was ambulating 175 feet with a rolling walker.  She required supervision and contact-guard assistance with ADLs.  Home Health PT/OT have been ordered. It is recommended that sedatives, sleeping pills, muscle relaxants be avoided as these are associated with increased risk of recurrent falls.  Acute blood loss anemia (ABLA) 03/02/2024 slight improvement in normochromic, normocytic anemia with H/H of 9.5/29.2.  She can take over-the-counter iron supplement daily until she follows up with her PCP.  Moderate protein malnutrition (HCC) Protein supplementation such as Ensure recommended at home with follow-up of albumin /total protein by PCP.

## 2024-03-16 NOTE — Assessment & Plan Note (Signed)
 Protein supplementation such as Ensure recommended at home with follow-up of albumin /total protein by PCP.

## 2024-03-16 NOTE — Assessment & Plan Note (Addendum)
 03/02/2024 slight improvement in normochromic, normocytic anemia with H/H of 9.5/29.2.  She can take over-the-counter iron supplement daily until she follows up with her PCP.

## 2024-03-16 NOTE — Assessment & Plan Note (Addendum)
 MRI prostate cancer she participated with PT/OT at the SNF.  At discharge she was ambulating 175 feet with a rolling walker.  She required supervision and contact-guard assistance with ADLs.  Home Health PT/OT have been ordered. It is recommended that sedatives, sleeping pills, muscle relaxants be avoided as these are associated with increased risk of recurrent falls. She is on full dose aspirin  for DVT prophylaxis.  I would recommend that she take enteric-coated full dose aspirin  after a meal until her Orthopedist tells her this can be stopped.

## 2024-03-17 DIAGNOSIS — S72002A Fracture of unspecified part of neck of left femur, initial encounter for closed fracture: Secondary | ICD-10-CM | POA: Diagnosis not present

## 2024-03-17 DIAGNOSIS — E440 Moderate protein-calorie malnutrition: Secondary | ICD-10-CM | POA: Diagnosis not present

## 2024-03-17 DIAGNOSIS — S72002D Fracture of unspecified part of neck of left femur, subsequent encounter for closed fracture with routine healing: Secondary | ICD-10-CM | POA: Diagnosis not present

## 2024-03-17 DIAGNOSIS — E039 Hypothyroidism, unspecified: Secondary | ICD-10-CM | POA: Diagnosis not present

## 2024-03-17 DIAGNOSIS — J3089 Other allergic rhinitis: Secondary | ICD-10-CM | POA: Diagnosis not present

## 2024-03-17 DIAGNOSIS — Z7982 Long term (current) use of aspirin: Secondary | ICD-10-CM | POA: Diagnosis not present

## 2024-03-17 DIAGNOSIS — Z96653 Presence of artificial knee joint, bilateral: Secondary | ICD-10-CM | POA: Diagnosis not present

## 2024-03-17 DIAGNOSIS — H269 Unspecified cataract: Secondary | ICD-10-CM | POA: Diagnosis not present

## 2024-03-17 DIAGNOSIS — Z981 Arthrodesis status: Secondary | ICD-10-CM | POA: Diagnosis not present

## 2024-03-17 DIAGNOSIS — E7800 Pure hypercholesterolemia, unspecified: Secondary | ICD-10-CM | POA: Diagnosis not present

## 2024-03-17 DIAGNOSIS — Z9181 History of falling: Secondary | ICD-10-CM | POA: Diagnosis not present

## 2024-03-17 DIAGNOSIS — D62 Acute posthemorrhagic anemia: Secondary | ICD-10-CM | POA: Diagnosis not present

## 2024-03-17 DIAGNOSIS — K219 Gastro-esophageal reflux disease without esophagitis: Secondary | ICD-10-CM | POA: Diagnosis not present

## 2024-03-17 DIAGNOSIS — N182 Chronic kidney disease, stage 2 (mild): Secondary | ICD-10-CM | POA: Diagnosis not present

## 2024-03-21 DIAGNOSIS — S72002A Fracture of unspecified part of neck of left femur, initial encounter for closed fracture: Secondary | ICD-10-CM | POA: Diagnosis not present

## 2024-03-21 DIAGNOSIS — E440 Moderate protein-calorie malnutrition: Secondary | ICD-10-CM | POA: Diagnosis not present

## 2024-03-21 DIAGNOSIS — E039 Hypothyroidism, unspecified: Secondary | ICD-10-CM | POA: Diagnosis not present

## 2024-03-21 DIAGNOSIS — H269 Unspecified cataract: Secondary | ICD-10-CM | POA: Diagnosis not present

## 2024-03-21 DIAGNOSIS — J3089 Other allergic rhinitis: Secondary | ICD-10-CM | POA: Diagnosis not present

## 2024-03-21 DIAGNOSIS — Z9181 History of falling: Secondary | ICD-10-CM | POA: Diagnosis not present

## 2024-03-21 DIAGNOSIS — Z7982 Long term (current) use of aspirin: Secondary | ICD-10-CM | POA: Diagnosis not present

## 2024-03-21 DIAGNOSIS — D62 Acute posthemorrhagic anemia: Secondary | ICD-10-CM | POA: Diagnosis not present

## 2024-03-21 DIAGNOSIS — S72002D Fracture of unspecified part of neck of left femur, subsequent encounter for closed fracture with routine healing: Secondary | ICD-10-CM | POA: Diagnosis not present

## 2024-03-21 DIAGNOSIS — Z981 Arthrodesis status: Secondary | ICD-10-CM | POA: Diagnosis not present

## 2024-03-21 DIAGNOSIS — Z96653 Presence of artificial knee joint, bilateral: Secondary | ICD-10-CM | POA: Diagnosis not present

## 2024-03-21 DIAGNOSIS — K219 Gastro-esophageal reflux disease without esophagitis: Secondary | ICD-10-CM | POA: Diagnosis not present

## 2024-03-21 DIAGNOSIS — N182 Chronic kidney disease, stage 2 (mild): Secondary | ICD-10-CM | POA: Diagnosis not present

## 2024-03-21 DIAGNOSIS — E7800 Pure hypercholesterolemia, unspecified: Secondary | ICD-10-CM | POA: Diagnosis not present

## 2024-03-22 DIAGNOSIS — E7800 Pure hypercholesterolemia, unspecified: Secondary | ICD-10-CM | POA: Diagnosis not present

## 2024-03-22 DIAGNOSIS — Z981 Arthrodesis status: Secondary | ICD-10-CM | POA: Diagnosis not present

## 2024-03-22 DIAGNOSIS — H269 Unspecified cataract: Secondary | ICD-10-CM | POA: Diagnosis not present

## 2024-03-22 DIAGNOSIS — S72002D Fracture of unspecified part of neck of left femur, subsequent encounter for closed fracture with routine healing: Secondary | ICD-10-CM | POA: Diagnosis not present

## 2024-03-22 DIAGNOSIS — D62 Acute posthemorrhagic anemia: Secondary | ICD-10-CM | POA: Diagnosis not present

## 2024-03-22 DIAGNOSIS — E039 Hypothyroidism, unspecified: Secondary | ICD-10-CM | POA: Diagnosis not present

## 2024-03-22 DIAGNOSIS — K219 Gastro-esophageal reflux disease without esophagitis: Secondary | ICD-10-CM | POA: Diagnosis not present

## 2024-03-22 DIAGNOSIS — S72002A Fracture of unspecified part of neck of left femur, initial encounter for closed fracture: Secondary | ICD-10-CM | POA: Diagnosis not present

## 2024-03-22 DIAGNOSIS — Z7982 Long term (current) use of aspirin: Secondary | ICD-10-CM | POA: Diagnosis not present

## 2024-03-22 DIAGNOSIS — Z9181 History of falling: Secondary | ICD-10-CM | POA: Diagnosis not present

## 2024-03-22 DIAGNOSIS — Z96653 Presence of artificial knee joint, bilateral: Secondary | ICD-10-CM | POA: Diagnosis not present

## 2024-03-22 DIAGNOSIS — E440 Moderate protein-calorie malnutrition: Secondary | ICD-10-CM | POA: Diagnosis not present

## 2024-03-22 DIAGNOSIS — N182 Chronic kidney disease, stage 2 (mild): Secondary | ICD-10-CM | POA: Diagnosis not present

## 2024-03-22 DIAGNOSIS — J3089 Other allergic rhinitis: Secondary | ICD-10-CM | POA: Diagnosis not present

## 2024-03-23 DIAGNOSIS — S72002A Fracture of unspecified part of neck of left femur, initial encounter for closed fracture: Secondary | ICD-10-CM | POA: Diagnosis not present

## 2024-03-23 DIAGNOSIS — H269 Unspecified cataract: Secondary | ICD-10-CM | POA: Diagnosis not present

## 2024-03-23 DIAGNOSIS — J3089 Other allergic rhinitis: Secondary | ICD-10-CM | POA: Diagnosis not present

## 2024-03-23 DIAGNOSIS — S72002D Fracture of unspecified part of neck of left femur, subsequent encounter for closed fracture with routine healing: Secondary | ICD-10-CM | POA: Diagnosis not present

## 2024-03-23 DIAGNOSIS — Z981 Arthrodesis status: Secondary | ICD-10-CM | POA: Diagnosis not present

## 2024-03-23 DIAGNOSIS — E039 Hypothyroidism, unspecified: Secondary | ICD-10-CM | POA: Diagnosis not present

## 2024-03-23 DIAGNOSIS — Z7982 Long term (current) use of aspirin: Secondary | ICD-10-CM | POA: Diagnosis not present

## 2024-03-23 DIAGNOSIS — K219 Gastro-esophageal reflux disease without esophagitis: Secondary | ICD-10-CM | POA: Diagnosis not present

## 2024-03-23 DIAGNOSIS — Z96653 Presence of artificial knee joint, bilateral: Secondary | ICD-10-CM | POA: Diagnosis not present

## 2024-03-23 DIAGNOSIS — N182 Chronic kidney disease, stage 2 (mild): Secondary | ICD-10-CM | POA: Diagnosis not present

## 2024-03-23 DIAGNOSIS — D62 Acute posthemorrhagic anemia: Secondary | ICD-10-CM | POA: Diagnosis not present

## 2024-03-23 DIAGNOSIS — E7800 Pure hypercholesterolemia, unspecified: Secondary | ICD-10-CM | POA: Diagnosis not present

## 2024-03-23 DIAGNOSIS — Z9181 History of falling: Secondary | ICD-10-CM | POA: Diagnosis not present

## 2024-03-23 DIAGNOSIS — E440 Moderate protein-calorie malnutrition: Secondary | ICD-10-CM | POA: Diagnosis not present

## 2024-03-27 DIAGNOSIS — Z7982 Long term (current) use of aspirin: Secondary | ICD-10-CM | POA: Diagnosis not present

## 2024-03-27 DIAGNOSIS — S72002A Fracture of unspecified part of neck of left femur, initial encounter for closed fracture: Secondary | ICD-10-CM | POA: Diagnosis not present

## 2024-03-27 DIAGNOSIS — S72002D Fracture of unspecified part of neck of left femur, subsequent encounter for closed fracture with routine healing: Secondary | ICD-10-CM | POA: Diagnosis not present

## 2024-03-27 DIAGNOSIS — Z981 Arthrodesis status: Secondary | ICD-10-CM | POA: Diagnosis not present

## 2024-03-27 DIAGNOSIS — E039 Hypothyroidism, unspecified: Secondary | ICD-10-CM | POA: Diagnosis not present

## 2024-03-27 DIAGNOSIS — Z96653 Presence of artificial knee joint, bilateral: Secondary | ICD-10-CM | POA: Diagnosis not present

## 2024-03-27 DIAGNOSIS — H269 Unspecified cataract: Secondary | ICD-10-CM | POA: Diagnosis not present

## 2024-03-27 DIAGNOSIS — D62 Acute posthemorrhagic anemia: Secondary | ICD-10-CM | POA: Diagnosis not present

## 2024-03-27 DIAGNOSIS — J3089 Other allergic rhinitis: Secondary | ICD-10-CM | POA: Diagnosis not present

## 2024-03-27 DIAGNOSIS — N182 Chronic kidney disease, stage 2 (mild): Secondary | ICD-10-CM | POA: Diagnosis not present

## 2024-03-27 DIAGNOSIS — K219 Gastro-esophageal reflux disease without esophagitis: Secondary | ICD-10-CM | POA: Diagnosis not present

## 2024-03-27 DIAGNOSIS — Z9181 History of falling: Secondary | ICD-10-CM | POA: Diagnosis not present

## 2024-03-27 DIAGNOSIS — E7800 Pure hypercholesterolemia, unspecified: Secondary | ICD-10-CM | POA: Diagnosis not present

## 2024-03-27 DIAGNOSIS — E440 Moderate protein-calorie malnutrition: Secondary | ICD-10-CM | POA: Diagnosis not present

## 2024-04-06 DIAGNOSIS — S7290XA Unspecified fracture of unspecified femur, initial encounter for closed fracture: Secondary | ICD-10-CM | POA: Diagnosis not present

## 2024-04-06 DIAGNOSIS — E559 Vitamin D deficiency, unspecified: Secondary | ICD-10-CM | POA: Diagnosis not present

## 2024-04-06 DIAGNOSIS — E785 Hyperlipidemia, unspecified: Secondary | ICD-10-CM | POA: Diagnosis not present

## 2024-04-06 DIAGNOSIS — R7309 Other abnormal glucose: Secondary | ICD-10-CM | POA: Diagnosis not present

## 2024-04-06 DIAGNOSIS — E039 Hypothyroidism, unspecified: Secondary | ICD-10-CM | POA: Diagnosis not present

## 2024-04-06 DIAGNOSIS — Z23 Encounter for immunization: Secondary | ICD-10-CM | POA: Diagnosis not present

## 2024-04-06 DIAGNOSIS — D649 Anemia, unspecified: Secondary | ICD-10-CM | POA: Diagnosis not present

## 2024-04-06 DIAGNOSIS — M1990 Unspecified osteoarthritis, unspecified site: Secondary | ICD-10-CM | POA: Diagnosis not present

## 2024-04-17 DIAGNOSIS — S72142D Displaced intertrochanteric fracture of left femur, subsequent encounter for closed fracture with routine healing: Secondary | ICD-10-CM | POA: Diagnosis not present

## 2024-05-31 ENCOUNTER — Other Ambulatory Visit: Payer: Self-pay

## 2024-05-31 ENCOUNTER — Inpatient Hospital Stay (HOSPITAL_COMMUNITY)
Admission: EM | Admit: 2024-05-31 | Discharge: 2024-06-05 | DRG: 480 | Disposition: A | Discharge status: Skilled Nursing Facility | Attending: Internal Medicine | Admitting: Internal Medicine

## 2024-05-31 ENCOUNTER — Emergency Department (HOSPITAL_COMMUNITY)

## 2024-05-31 ENCOUNTER — Encounter (HOSPITAL_COMMUNITY): Payer: Self-pay | Admitting: Emergency Medicine

## 2024-05-31 DIAGNOSIS — M25551 Pain in right hip: Secondary | ICD-10-CM | POA: Diagnosis not present

## 2024-05-31 DIAGNOSIS — Z743 Need for continuous supervision: Secondary | ICD-10-CM | POA: Diagnosis not present

## 2024-05-31 DIAGNOSIS — S72001A Fracture of unspecified part of neck of right femur, initial encounter for closed fracture: Principal | ICD-10-CM

## 2024-05-31 DIAGNOSIS — E039 Hypothyroidism, unspecified: Secondary | ICD-10-CM | POA: Diagnosis present

## 2024-05-31 DIAGNOSIS — E43 Unspecified severe protein-calorie malnutrition: Secondary | ICD-10-CM | POA: Insufficient documentation

## 2024-05-31 DIAGNOSIS — S72002A Fracture of unspecified part of neck of left femur, initial encounter for closed fracture: Secondary | ICD-10-CM | POA: Diagnosis present

## 2024-05-31 DIAGNOSIS — E7800 Pure hypercholesterolemia, unspecified: Secondary | ICD-10-CM | POA: Diagnosis present

## 2024-05-31 DIAGNOSIS — K219 Gastro-esophageal reflux disease without esophagitis: Secondary | ICD-10-CM | POA: Diagnosis present

## 2024-05-31 LAB — CBC WITH DIFFERENTIAL/PLATELET
Abs Immature Granulocytes: 0.04 K/uL (ref 0.00–0.07)
Basophils Absolute: 0.1 K/uL (ref 0.0–0.1)
Basophils Relative: 1 %
Eosinophils Absolute: 0 K/uL (ref 0.0–0.5)
Eosinophils Relative: 0 %
HCT: 44.5 % (ref 36.0–46.0)
Hemoglobin: 13.9 g/dL (ref 12.0–15.0)
Immature Granulocytes: 0 %
Lymphocytes Relative: 10 %
Lymphs Abs: 1 K/uL (ref 0.7–4.0)
MCH: 28.3 pg (ref 26.0–34.0)
MCHC: 31.2 g/dL (ref 30.0–36.0)
MCV: 90.4 fL (ref 80.0–100.0)
Monocytes Absolute: 0.8 K/uL (ref 0.1–1.0)
Monocytes Relative: 8 %
Neutro Abs: 8.5 K/uL — ABNORMAL HIGH (ref 1.7–7.7)
Neutrophils Relative %: 81 %
Platelets: 265 K/uL (ref 150–400)
RBC: 4.92 MIL/uL (ref 3.87–5.11)
RDW: 14.5 % (ref 11.5–15.5)
WBC: 10.5 K/uL (ref 4.0–10.5)
nRBC: 0 % (ref 0.0–0.2)

## 2024-05-31 LAB — COMPREHENSIVE METABOLIC PANEL WITH GFR
ALT: 14 U/L (ref 0–44)
AST: 26 U/L (ref 15–41)
Albumin: 4.6 g/dL (ref 3.5–5.0)
Alkaline Phosphatase: 121 U/L (ref 38–126)
Anion gap: 14 (ref 5–15)
BUN: 13 mg/dL (ref 8–23)
CO2: 26 mmol/L (ref 22–32)
Calcium: 10.4 mg/dL — ABNORMAL HIGH (ref 8.9–10.3)
Chloride: 102 mmol/L (ref 98–111)
Creatinine, Ser: 0.77 mg/dL (ref 0.44–1.00)
GFR, Estimated: >60 mL/min (ref >60)
Glucose, Bld: 121 mg/dL — ABNORMAL HIGH (ref 70–99)
Potassium: 4.3 mmol/L (ref 3.5–5.1)
Sodium: 142 mmol/L (ref 135–145)
Total Bilirubin: 0.5 mg/dL (ref 0.0–1.2)
Total Protein: 7.5 g/dL (ref 6.5–8.1)

## 2024-05-31 LAB — TYPE AND SCREEN
ABO/RH(D): A POS
Antibody Screen: NEGATIVE

## 2024-05-31 LAB — SURGICAL PCR SCREEN
MRSA, PCR: NEGATIVE
Staphylococcus aureus: NEGATIVE

## 2024-05-31 MED ORDER — VITAMIN D 25 MCG (1000 UNIT) PO TABS
1000.0000 [IU] | ORAL_TABLET | Freq: Every day | ORAL | Status: DC
  Administered 2024-06-02 – 2024-06-05 (×4): 1000 [IU] via ORAL
  Filled 2024-05-31 (×4): qty 1

## 2024-05-31 MED ORDER — CALCIUM-VITAMIN D 250-125 MG-UNIT PO TABS: 2.0000 | ORAL_TABLET | Freq: Every day | ORAL | Status: DC

## 2024-05-31 MED ORDER — METHOCARBAMOL 500 MG PO TABS
500.0000 mg | ORAL_TABLET | Freq: Four times a day (QID) | ORAL | Status: DC | PRN
  Administered 2024-05-31 – 2024-06-05 (×3): 500 mg via ORAL
  Filled 2024-05-31 (×3): qty 1

## 2024-05-31 MED ORDER — ONDANSETRON HCL 4 MG/2ML IJ SOLN
4.0000 mg | Freq: Once | INTRAMUSCULAR | Status: AC
  Administered 2024-05-31: 4 mg via INTRAVENOUS
  Filled 2024-05-31: qty 2

## 2024-05-31 MED ORDER — OYSTER SHELL CALCIUM/D3 500-5 MG-MCG PO TABS
2.0000 | ORAL_TABLET | Freq: Every day | ORAL | Status: DC
  Administered 2024-06-02 – 2024-06-05 (×4): 2 via ORAL
  Filled 2024-05-31 (×4): qty 2

## 2024-05-31 MED ORDER — POLYETHYLENE GLYCOL 3350 17 G PO PACK
17.0000 g | PACK | Freq: Every day | ORAL | Status: DC | PRN
  Administered 2024-06-04: 17 g via ORAL
  Filled 2024-05-31: qty 1

## 2024-05-31 MED ORDER — ADULT MULTIVITAMIN W/MINERALS CH
1.0000 | ORAL_TABLET | Freq: Every day | ORAL | Status: DC
  Administered 2024-06-02 – 2024-06-05 (×4): 1 via ORAL
  Filled 2024-05-31 (×4): qty 1

## 2024-05-31 MED ORDER — LEVOTHYROXINE SODIUM 88 MCG PO TABS
88.0000 ug | ORAL_TABLET | Freq: Every day | ORAL | Status: DC
  Administered 2024-06-01 – 2024-06-05 (×5): 88 ug via ORAL
  Filled 2024-05-31 (×5): qty 1

## 2024-05-31 MED ORDER — MORPHINE SULFATE (PF) 2 MG/ML IV SOLN
2.0000 mg | Freq: Once | INTRAVENOUS | Status: AC
  Administered 2024-05-31: 2 mg via INTRAVENOUS
  Filled 2024-05-31: qty 1

## 2024-05-31 MED ORDER — HYDROCODONE-ACETAMINOPHEN 5-325 MG PO TABS
1.0000 | ORAL_TABLET | Freq: Four times a day (QID) | ORAL | Status: DC | PRN
  Administered 2024-05-31 – 2024-06-02 (×2): 2 via ORAL
  Administered 2024-06-02 – 2024-06-05 (×7): 1 via ORAL
  Filled 2024-05-31 (×4): qty 1
  Filled 2024-05-31: qty 2
  Filled 2024-05-31: qty 1
  Filled 2024-05-31: qty 2
  Filled 2024-05-31: qty 1
  Filled 2024-05-31: qty 2
  Filled 2024-05-31: qty 1

## 2024-05-31 MED ORDER — MUPIROCIN 2 % EX OINT
1.0000 | TOPICAL_OINTMENT | Freq: Two times a day (BID) | CUTANEOUS | Status: DC
  Administered 2024-05-31: 1 via NASAL
  Filled 2024-05-31: qty 22

## 2024-05-31 MED ORDER — MORPHINE SULFATE (PF) 4 MG/ML IV SOLN
4.0000 mg | Freq: Once | INTRAVENOUS | Status: AC
  Administered 2024-05-31: 4 mg via INTRAVENOUS
  Filled 2024-05-31: qty 1

## 2024-05-31 MED ORDER — ROSUVASTATIN CALCIUM 20 MG PO TABS
20.0000 mg | ORAL_TABLET | Freq: Every day | ORAL | Status: DC
  Administered 2024-05-31 – 2024-06-04 (×5): 20 mg via ORAL
  Filled 2024-05-31 (×5): qty 1

## 2024-05-31 MED ORDER — PANTOPRAZOLE SODIUM 40 MG PO TBEC
40.0000 mg | DELAYED_RELEASE_TABLET | Freq: Every day | ORAL | Status: DC
  Administered 2024-06-02 – 2024-06-05 (×4): 40 mg via ORAL
  Filled 2024-05-31 (×4): qty 1

## 2024-05-31 MED ORDER — SENNA 8.6 MG PO TABS
1.0000 | ORAL_TABLET | Freq: Two times a day (BID) | ORAL | Status: DC
  Administered 2024-05-31 – 2024-06-05 (×9): 8.6 mg via ORAL
  Filled 2024-05-31 (×9): qty 1

## 2024-05-31 MED ORDER — MORPHINE SULFATE (PF) 2 MG/ML IV SOLN
1.0000 mg | INTRAVENOUS | Status: DC | PRN
  Administered 2024-06-01 (×2): 2 mg via INTRAVENOUS
  Filled 2024-05-31 (×2): qty 1

## 2024-05-31 MED ORDER — VITAMIN E 45 MG (100 UNIT) PO CAPS
400.0000 [IU] | ORAL_CAPSULE | Freq: Every day | ORAL | Status: DC
  Administered 2024-06-02 – 2024-06-05 (×4): 400 [IU] via ORAL
  Filled 2024-05-31: qty 1
  Filled 2024-05-31 (×4): qty 4

## 2024-05-31 MED ORDER — FENTANYL CITRATE (PF) 100 MCG/2ML IJ SOLN
50.0000 ug | Freq: Once | INTRAMUSCULAR | Status: AC
  Administered 2024-05-31: 50 ug via INTRAVENOUS
  Filled 2024-05-31: qty 2

## 2024-05-31 MED ORDER — METHOCARBAMOL 1000 MG/10ML IJ SOLN
500.0000 mg | Freq: Four times a day (QID) | INTRAMUSCULAR | Status: DC | PRN
  Administered 2024-06-02: 500 mg via INTRAVENOUS
  Filled 2024-05-31: qty 10

## 2024-05-31 NOTE — H&P (Signed)
 [facility] H&P   Patient Demographics:    Donna Daniels, is a 83 y.o. female  MRN: [id number]   DOB - [birth year]  Admit Date - 05/31/2024  Outpatient Primary MD for the patient is Marvine Rush, MD    Chief Complaint  Patient presents with   Fall      HPI:    [name]  is a 83 y.o. female, with medical history significant of hypothyroidism, hyperlipidemia, history of bilateral knee replacement.  With recent closed right hip fracture August 2025, status post IM rod fixation by Dr. Juli here on 02/20/2024. - Patient presents to ER today secondary to right hip pain she sustained after a fall, patient report she is currently ambulating mainly with cane, and walker as well trying to call her balance without a cane or walker, so she was struggling to walk on her own, where she had a mechanical fall when she was trying to turn around, fell to the ground, denies dizziness, lightheadedness, loss of consciousness or head trauma, she denies any dyspnea or chest pain. - In ED workup significant for right hip fracture, no significant labs abnormalities, ED discussed with Ortho and , Dr. Sherida from [facility] orthopedic who recommends admission to [facility] as plan to OR tomorrow, so [facility] hospitalist consulted to admit.    Review of systems:     A full 10 point Review of Systems was done, except as stated above, all other Review of Systems were negative.   With Past History of the following :    Past Medical History:  Diagnosis Date   Arthritis    Bruises easily    Cataracts, bilateral    Closed fracture of patella 03/06/2010   Qualifier: Diagnosis of   By: Margrette MD, [place] cholesterol    takes SImvastin  daily   History of bronchitis    last time about 62yrs ago   Hypothyroidism    takes Synthroid  daily   Joint pain    Joint swelling    PONV  (postoperative nausea and vomiting)    Sciatica    Thyroid  disorder       Past Surgical History:  Procedure Laterality Date   ABDOMINAL HYSTERECTOMY     ANTERIOR CERVICAL DECOMP/DISCECTOMY FUSION  12/18/2011   Procedure: ANTERIOR CERVICAL DECOMPRESSION/DISCECTOMY FUSION 2 LEVELS;  Surgeon: Darina MALVA Boehringer, MD;  Location: [facility] NEURO ORS;  Service: Neurosurgery;  Laterality: N/A;  Anterior cervical decompression fusion four-six   CESAREAN SECTION     x 1    COLONOSCOPY     HAND SURGERY Left    INTRAMEDULLARY (IM) NAIL INTERTROCHANTERIC Left 02/20/2024   Procedure: FIXATION, FRACTURE, INTERTROCHANTERIC, WITH INTRAMEDULLARY ROD;  Surgeon: Elsa Lonni SAUNDERS, MD;  Location: [facility] OR;  Service: Orthopedics;  Laterality: Left;   THYROIDECTOMY     TOTAL KNEE ARTHROPLASTY Right 04/18/2013  Procedure: TOTAL KNEE ARTHROPLASTY;  Surgeon: Maude KANDICE Herald, MD;  Location: [facility] OR;  Service: Orthopedics;  Laterality: Right;   TOTAL KNEE ARTHROPLASTY Left 07/25/2013   Procedure: TOTAL KNEE ARTHROPLASTY;  Surgeon: Maude KANDICE Herald, MD;  Location: [facility] OR;  Service: Orthopedics;  Laterality: Left;   VESICOVAGINAL FISTULA CLOSURE W/ TAH        Social History:     Social History   Tobacco Use   Smoking status: Never   Smokeless tobacco: Never  Substance Use Topics   Alcohol use: No      Family History :     Family History  Problem Relation Age of Onset   Breast cancer Neg Hx       Home Medications:   Prior to Admission medications   Medication Sig Start Date End Date Taking? Authorizing Provider  acetaminophen  (TYLENOL ) 500 MG tablet Take 500 mg by mouth every 6 (six) hours as needed.    [provider]  calcium -vitamin D (OSCAL) 250-125 MG-UNIT per tablet Take 2 tablets by mouth daily.    [provider]  cholecalciferol  (VITAMIN D) 1000 UNITS tablet Take 1,000 Units by mouth in the morning and at bedtime.    [provider]  cyclobenzaprine  (FLEXERIL ) 10 MG  tablet Take 10 mg by mouth 3 (three) times daily as needed for muscle spasms. 10/28/23   [provider]  ferrous sulfate  325 (65 FE) MG tablet Take 1 tablet (325 mg total) by mouth daily with breakfast. 02/24/24   Gherghe, Costin M, MD  levothyroxine  (SYNTHROID ) 88 MCG tablet Take 88 mcg by mouth daily. 01/10/24   [provider]  loratadine (CLARITIN) 10 MG tablet Take 10 mg by mouth daily. Patient not taking: Reported on 03/10/2024    [provider]  meloxicam (MOBIC) 15 MG tablet Take 15 mg by mouth daily as needed for pain. 02/08/24   [provider]  Multiple Vitamin (MULTIVITAMIN WITH MINERALS) TABS Take 1 tablet by mouth daily.    [provider]  omeprazole (PRILOSEC) 20 MG capsule Take 20 mg by mouth daily. 02/08/24   [provider]  ondansetron  (ZOFRAN ) 4 MG tablet Take 4 mg by mouth every 6 (six) hours as needed for nausea or vomiting.    [provider]  oxyCODONE  (OXY IR/ROXICODONE ) 5 MG immediate release tablet Take 1 tablet (5 mg total) by mouth every 6 (six) hours as needed for severe pain (pain score 7-10). Patient not taking: Reported on 03/10/2024 02/24/24   Gherghe, Costin M, MD  Protein (PROSOURCE PO) Take 30 mLs by mouth 2 (two) times daily.    [provider]  rosuvastatin  (CRESTOR ) 20 MG tablet Take 20 mg by mouth at bedtime. 02/02/24   [provider]  vitamin E 180 MG (400 UNITS) capsule Take 400 Units by mouth daily.    [provider]     Allergies:    No Known Allergies   Physical Exam:   Vitals  Blood pressure 125/72, pulse 98, temperature 97.8 F (36.6 C), temperature source Oral, resp. rate 20, SpO2 94%.   1. General Pleasant elderly female, laying in bed, no apparent distress  2. Normal affect and insight, Not Suicidal or Homicidal, Awake Alert, Oriented X 3.  3. No F.N deficits, ALL C.Nerves Intact, Strength 5/5 all 4 extremities, Sensation intact all 4 extremities,  Plantars down going.  4. Ears and Eyes appear Normal, Conjunctivae clear, PERRLA. Moist Oral Mucosa.  5. Supple Neck, No JVD, No  cervical lymphadenopathy appriciated, No Carotid Bruits.  6. Symmetrical Chest wall movement, Good air movement bilaterally, CTAB.  7. RRR, No Gallops, Rubs or Murmurs, No Parasternal Heave.  8. Positive Bowel Sounds, Abdomen Soft, No tenderness, No organomegaly appriciated,No rebound -guarding or rigidity.  9.  No Cyanosis, Normal Skin Turgor, No Skin Rash or Bruise.  10. Good muscle tone, bilateral lower extremity neurovascular intact, right lower extremity movement limited due to pain     Data Review:    CBC Recent Labs  Lab 05/31/24 1432  WBC 10.5  HGB 13.9  HCT 44.5  PLT 265  MCV 90.4  MCH 28.3  MCHC 31.2  RDW 14.5  LYMPHSABS 1.0  MONOABS 0.8  EOSABS 0.0  BASOSABS 0.1   ------------------------------------------------------------------------------------------------------------------  Chemistries  Recent Labs  Lab 05/31/24 1432  NA 142  K 4.3  CL 102  CO2 26  GLUCOSE 121*  BUN 13  CREATININE 0.77  CALCIUM  10.4*  AST 26  ALT 14  ALKPHOS 121  BILITOT 0.5   ------------------------------------------------------------------------------------------------------------------ CrCl cannot be calculated (Unknown ideal weight.). ------------------------------------------------------------------------------------------------------------------ No results for input(s): TSH, T4TOTAL, T3FREE, THYROIDAB in the last 72 hours.  Invalid input(s): FREET3  Coagulation profile No results for input(s): INR, PROTIME in the last 168 hours. ------------------------------------------------------------------------------------------------------------------- No results for input(s): DDIMER in the last 72 hours. -------------------------------------------------------------------------------------------------------------------  Cardiac  Enzymes No results for input(s): CKMB, TROPONINI, MYOGLOBIN in the last 168 hours.  Invalid input(s): CK ------------------------------------------------------------------------------------------------------------------ No results found for: BNP   ---------------------------------------------------------------------------------------------------------------  Urinalysis    Component Value Date/Time   COLORURINE AMBER (A) 07/17/2013 1216   APPEARANCEUR CLEAR 07/17/2013 1216   LABSPEC 1.029 07/17/2013 1216   PHURINE 6.0 07/17/2013 1216   GLUCOSEU NEGATIVE 07/17/2013 1216   HGBUR NEGATIVE 07/17/2013 1216   BILIRUBINUR SMALL (A) 07/17/2013 1216   KETONESUR 15 (A) 07/17/2013 1216   PROTEINUR NEGATIVE 07/17/2013 1216   UROBILINOGEN 1.0 07/17/2013 1216   NITRITE NEGATIVE 07/17/2013 1216   LEUKOCYTESUR SMALL (A) 07/17/2013 1216    ----------------------------------------------------------------------------------------------------------------   Imaging Results:    DG Chest 1 View Result Date: 05/31/2024 EXAM: 1 VIEW(S) XRAY OF THE CHEST 05/31/2024 02:20:00 PM COMPARISON: 02/19/2024 CLINICAL HISTORY: 144615 Pain 144615 Pain FINDINGS: LUNGS AND PLEURA: No focal pulmonary opacity. No pleural effusion. No pneumothorax. HEART AND MEDIASTINUM: No acute abnormality of the cardiac and mediastinal silhouettes. BONES AND SOFT TISSUES: No acute osseous abnormality. IMPRESSION: 1. No acute cardiopulmonary disease. Electronically signed by: Lynwood Seip MD 05/31/2024 02:56 PM EST RP Workstation: [id number]   DG Hip Unilat W or Wo Pelvis 2-3 Views Right Result Date: 05/31/2024 EXAM: 2 or 3 VIEW(S) XRAY OF THE RIGHT HIP 05/31/2024 02:20:00 PM COMPARISON: None available. CLINICAL HISTORY: fall, r hip pain FINDINGS: BONES AND JOINTS: Moderately displaced and comminuted fracture is seen involving the intertrochanteric region of the proximal right femur. SOFT TISSUES: The soft tissues are  unremarkable. IMPRESSION: 1. Moderately displaced comminuted intertrochanteric fracture of the proximal right femur. Electronically signed by: Lynwood Seip MD 05/31/2024 02:55 PM EST RP Workstation: [id number]      Assessment & Plan:    Principal Problem:   Closed left hip fracture (HCC) Active Problems:   High cholesterol   Hypothyroidism   GERD without esophagitis    Right hip fracture status post mechanical fall - Secondary to mechanical fall. - N.p.o. after midnight. - Plan 2 OR tomorrow, [facility] Dr. Sherida consulted - As needed pain regimen - Continue with IV fluids. - No cardiac history.  No further  workup needed before surgery. -  SCD for DVT prophylaxis for now, pharmacologic can be started after surgery per Ortho discretion - Continue with calcium  and vitamin D  Hyperlipidemia - Continue with home statin  Hypothyroidism - Continue with home Synthroid    DVT Prophylaxis  SCDs   AM Labs Ordered, also please review Full Orders  Family Communication: Admission, patients condition and plan of care including tests being ordered have been discussed with the patient and her, daughter, grandson at bedside who indicate understanding and agree with the plan and Code Status.  Code Status full code  Likely DC to likely SNF   Consults called: [facility]  Admission status: Inpatient  Time spent in minutes : 60 minutes   Brayton Lye M.D on 05/31/2024 at 5:46 PM   [facility] - Office  [phone]

## 2024-05-31 NOTE — ED Notes (Signed)
 All paperwork given to [facility] for transport. Pt in NAD upon departure.

## 2024-05-31 NOTE — ED Notes (Signed)
 5N at [facility] notified that patient is en route to their facility at this time.

## 2024-05-31 NOTE — ED Notes (Signed)
[facility] called to transport patient. Nurse notified 

## 2024-05-31 NOTE — Consult Note (Signed)
 Brief consult note  83 year old female with mechanical fall with right hip IT fracture. Recently had left hip fx and CMN with Dr. Elsa in August  Plan for transfer to [facility] to hopsitalist service Plan for OR tomorrow if medically cleared NPO at midnight

## 2024-05-31 NOTE — ED Triage Notes (Signed)
 Pt arrived via [facility] from home c/o a fall R hip pain, denies hitting head, denies use of blood thinners, denies LOC

## 2024-05-31 NOTE — Plan of Care (Signed)

## 2024-05-31 NOTE — ED Provider Notes (Cosign Needed Addendum)
 [place] EMERGENCY DEPARTMENT AT [facility] Provider Note   CSN: [id number] Arrival date & time: 05/31/24  1302     Patient presents with: Donna Daniels P Donna Daniels is a 83 y.o. female.  She has history of hypothyroidism, GERD, prior left hip fracture.  She presents the ER today complaining of right hip pain after fall.  States she turned around and fell to the ground.  Denies dizziness, denies LOC, denies head injury, she is not on blood thinners, no chest pain, no neck pain, no numbness tingling or weakness.  Pain is isolated to the posterior aspect of the right hip.  She is not able to ambulate after the fall.    Fall       Prior to Admission medications   Medication Sig Start Date End Date Taking? Authorizing Provider  acetaminophen  (TYLENOL ) 500 MG tablet Take 500 mg by mouth every 6 (six) hours as needed.    [provider]  calcium -vitamin D (OSCAL) 250-125 MG-UNIT per tablet Take 2 tablets by mouth daily.    [provider]  cholecalciferol  (VITAMIN D) 1000 UNITS tablet Take 1,000 Units by mouth in the morning and at bedtime.    [provider]  cyclobenzaprine  (FLEXERIL ) 10 MG tablet Take 10 mg by mouth 3 (three) times daily as needed for muscle spasms. 10/28/23   [provider]  ferrous sulfate  325 (65 FE) MG tablet Take 1 tablet (325 mg total) by mouth daily with breakfast. 02/24/24   Trixie Nilda HERO, MD  levothyroxine  (SYNTHROID ) 88 MCG tablet Take 88 mcg by mouth daily. 01/10/24   [provider]  loratadine (CLARITIN) 10 MG tablet Take 10 mg by mouth daily. Patient not taking: Reported on 03/10/2024    [provider]  meloxicam (MOBIC) 15 MG tablet Take 15 mg by mouth daily as needed for pain. 02/08/24   [provider]  Multiple Vitamin (MULTIVITAMIN WITH MINERALS) TABS Take 1 tablet by mouth daily.    [provider]  omeprazole (PRILOSEC) 20 MG capsule Take 20 mg by mouth daily. 02/08/24    [provider]  ondansetron  (ZOFRAN ) 4 MG tablet Take 4 mg by mouth every 6 (six) hours as needed for nausea or vomiting.    [provider]  oxyCODONE  (OXY IR/ROXICODONE ) 5 MG immediate release tablet Take 1 tablet (5 mg total) by mouth every 6 (six) hours as needed for severe pain (pain score 7-10). Patient not taking: Reported on 03/10/2024 02/24/24   Gherghe, Costin M, MD  Protein (PROSOURCE PO) Take 30 mLs by mouth 2 (two) times daily.    [provider]  rosuvastatin  (CRESTOR ) 20 MG tablet Take 20 mg by mouth at bedtime. 02/02/24   [provider]  vitamin E 180 MG (400 UNITS) capsule Take 400 Units by mouth daily.    [provider]    Allergies: Patient has no known allergies.    Review of Systems  Updated Vital Signs BP (!) 156/78 (BP Location: Left Arm)   Pulse 84   Temp 97.7 F (36.5 C) (Oral)   Resp 19   SpO2 97%   Physical Exam Vitals and nursing note reviewed.  Constitutional:      General: She is not in acute distress.    Appearance: She is well-developed.  HENT:     Head: Normocephalic and atraumatic.     Mouth/Throat:     Mouth: Mucous membranes are moist.  Eyes:     Extraocular  Movements: Extraocular movements intact.     Conjunctiva/sclera: Conjunctivae normal.     Pupils: Pupils are equal, round, and reactive to light.  Cardiovascular:     Rate and Rhythm: Normal rate and regular rhythm.     Heart sounds: No murmur heard. Pulmonary:     Effort: Pulmonary effort is normal. No respiratory distress.     Breath sounds: Normal breath sounds.  Abdominal:     Palpations: Abdomen is soft.     Tenderness: There is no abdominal tenderness.  Musculoskeletal:        General: No swelling.     Cervical back: Normal range of motion and neck supple. No tenderness.     Comments: Tenderness to right lateral posterior hip.  DP and PT pulses in right foot are intact.  No tenderness of knee, lower leg or ankle.  Range of motion  not tested due to pain.  Skin:    General: Skin is warm and dry.     Capillary Refill: Capillary refill takes less than 2 seconds.  Neurological:     General: No focal deficit present.     Mental Status: She is alert and oriented to person, place, and time.  Psychiatric:        Mood and Affect: Mood normal.     (all labs ordered are listed, but only abnormal results are displayed) Labs Reviewed  CBC WITH DIFFERENTIAL/PLATELET - Abnormal; Notable for the following components:      Result Value   Neutro Abs 8.5 (*)    All other components within normal limits  COMPREHENSIVE METABOLIC PANEL WITH GFR    EKG: EKG Interpretation Date/Time:  Wednesday May 31 2024 14:27:28 EST Ventricular Rate:  83 PR Interval:  194 QRS Duration:  80 QT Interval:  416 QTC Calculation: 488 R Axis:   30  Text Interpretation: Normal sinus rhythm Low voltage QRS Cannot rule out Anterior infarct , age undetermined Abnormal ECG When compared with ECG of 13-Apr-2013 10:47, Minimal criteria for Anterior infarct are now Present Borderline criteria for Inferior infarct are no longer Present T wave inversion no longer evident in Lateral leads QT has lengthened Confirmed by Yolande Charleston [phone]) on 05/31/2024 2:46:18 PM  Radiology: DG Chest 1 View Result Date: 05/31/2024 EXAM: 1 VIEW(S) XRAY OF THE CHEST 05/31/2024 02:20:00 PM COMPARISON: 02/19/2024 CLINICAL HISTORY: 144615 Pain 144615 Pain FINDINGS: LUNGS AND PLEURA: No focal pulmonary opacity. No pleural effusion. No pneumothorax. HEART AND MEDIASTINUM: No acute abnormality of the cardiac and mediastinal silhouettes. BONES AND SOFT TISSUES: No acute osseous abnormality. IMPRESSION: 1. No acute cardiopulmonary disease. Electronically signed by: Lynwood Seip MD 05/31/2024 02:56 PM EST RP Workstation: [id number]   DG Hip Unilat W or Wo Pelvis 2-3 Views Right Result Date: 05/31/2024 EXAM: 2 or 3 VIEW(S) XRAY OF THE RIGHT HIP 05/31/2024 02:20:00 PM COMPARISON:  None available. CLINICAL HISTORY: fall, r hip pain FINDINGS: BONES AND JOINTS: Moderately displaced and comminuted fracture is seen involving the intertrochanteric region of the proximal right femur. SOFT TISSUES: The soft tissues are unremarkable. IMPRESSION: 1. Moderately displaced comminuted intertrochanteric fracture of the proximal right femur. Electronically signed by: Lynwood Seip MD 05/31/2024 02:55 PM EST RP Workstation: [id number]     Procedures   Medications Ordered in the ED  fentaNYL  (SUBLIMAZE ) injection 50 mcg (50 mcg Intravenous Given 05/31/24 1438)  morphine  (PF) 4 MG/ML injection 4 mg (4 mg Intravenous Given 05/31/24 1527)  ondansetron  (ZOFRAN ) injection 4 mg (4 mg Intravenous Given 05/31/24 1526)  Clinical Course as of 05/31/24 1534  Wed May 31, 2024  1446 Right hip pain after fall, she has fracture on x-ray.  Given fentanyl  for pain.  Dr. Elsa repaired her left hip fracture 3 months ago.  Consulted on-call orthopedics for [facility].  I spoke with Dr. Sherida.  He is going to call me back to let me know if he would refer patient to go to [facility] or [facility]. [CB]  [phone] Patient to be admitted to [facility] with plan for surgery tomorrow.  Labs pending, will consult hospitalist for admission after labs result. [CB]    Clinical Course User Index [CB] Suellen Sherran LABOR, PA-C                                 Medical Decision Making Differential diagnosis includes but not limited to fracture, sprain, strain, contusion, dislocation, other  ED course: Patient presents with right hip injury after fall.  She states she fell when she lost her balance turning around.  She did not get dizzy or pass out.  She has tenderness to the posterior aspect right hip.  She had no chest pain or shortness of breath.  She states she just turned around and wound up on the ground, this is a same thing that happened when she broke her left hip several months ago.  Right leg is  neurovascularly intact.    X-ray was ordered.  I viewed and interpreted images and independently.  This shows a moderately displaced comminuted intra trochanteric fracture of the right proximal femur.  I agree with radiology read.  Chest x-ray also ordered for preop, she has no pulmonary edema, infiltrate, normal mediastinum per my interpretation.  I agree with radiology read.  Patient was given fentanyl  for pain with minimal relief, morphine  ordered at this time.  EKG shows normal sinus rhythm, no STEMI  I consulted Dr. Sherida who is on for [facility] orthopedics.  He would like patient admitted to [facility] with plan to repair patient's hip tomorrow.  Requested hospitalist admission to [facility].  I consulted with Dr. Sherlon for admission to [facility].  Amount and/or Complexity of Data Reviewed External Data Reviewed: labs, radiology and notes. Labs: ordered. Decision-making details documented in ED Course.    Details: D with no leukocytosis or anemia, CMP with very slight hypocalcemia.  10.4 Radiology: ordered and independent interpretation performed. Decision-making details documented in ED Course. ECG/medicine tests:     Details: EKG shows normal sinus rhythm.  Risk Prescription drug management. Decision regarding hospitalization.        Final diagnoses:  None    ED Discharge Orders     None          Suellen Sherran LABOR DEVONNA 05/31/24 1656    Suellen Sherran LABOR DEVONNA 05/31/24 1743    Yolande Lamar BROCKS, MD 06/01/24 1254

## 2024-06-01 ENCOUNTER — Encounter (HOSPITAL_COMMUNITY)
Admission: EM | Disposition: A | Discharge status: Skilled Nursing Facility | Payer: Self-pay | Source: Home / Self Care | Attending: Internal Medicine

## 2024-06-01 ENCOUNTER — Inpatient Hospital Stay (HOSPITAL_COMMUNITY)

## 2024-06-01 ENCOUNTER — Encounter (HOSPITAL_COMMUNITY): Payer: Self-pay | Admitting: Internal Medicine

## 2024-06-01 ENCOUNTER — Other Ambulatory Visit: Payer: Self-pay

## 2024-06-01 ENCOUNTER — Inpatient Hospital Stay (HOSPITAL_COMMUNITY): Admitting: Anesthesiology

## 2024-06-01 DIAGNOSIS — E43 Unspecified severe protein-calorie malnutrition: Secondary | ICD-10-CM | POA: Insufficient documentation

## 2024-06-01 HISTORY — PX: INTRAMEDULLARY (IM) NAIL INTERTROCHANTERIC: SHX5875

## 2024-06-01 SURGERY — FIXATION, FRACTURE, INTERTROCHANTERIC, WITH INTRAMEDULLARY ROD
Anesthesia: General | Laterality: Right

## 2024-06-01 MED ORDER — ACETAMINOPHEN 500 MG PO TABS
ORAL_TABLET | ORAL | Status: AC
  Administered 2024-06-01: 1000 mg via ORAL
  Filled 2024-06-01: qty 2

## 2024-06-01 MED ORDER — CEFAZOLIN SODIUM-DEXTROSE 2-4 GM/100ML-% IV SOLN
INTRAVENOUS | Status: AC
  Filled 2024-06-01: qty 100

## 2024-06-01 MED ORDER — LACTATED RINGERS IV SOLN: INTRAVENOUS | Status: DC

## 2024-06-01 MED ORDER — FENTANYL CITRATE (PF) 250 MCG/5ML IJ SOLN
INTRAMUSCULAR | Status: DC | PRN
  Administered 2024-06-01 (×2): 50 ug via INTRAVENOUS

## 2024-06-01 MED ORDER — PROPOFOL 10 MG/ML IV BOLUS
INTRAVENOUS | Status: DC | PRN
  Administered 2024-06-01: 50 mg via INTRAVENOUS

## 2024-06-01 MED ORDER — CEFAZOLIN SODIUM-DEXTROSE 2-4 GM/100ML-% IV SOLN
2.0000 g | Freq: Once | INTRAVENOUS | Status: AC
  Administered 2024-06-01: 2 g via INTRAVENOUS

## 2024-06-01 MED ORDER — CEFAZOLIN SODIUM-DEXTROSE 1-4 GM/50ML-% IV SOLN
1.0000 g | Freq: Three times a day (TID) | INTRAVENOUS | Status: AC
  Administered 2024-06-02 (×3): 1 g via INTRAVENOUS
  Filled 2024-06-01 (×3): qty 50

## 2024-06-01 MED ORDER — JUVEN PO PACK
1.0000 | PACK | Freq: Two times a day (BID) | ORAL | Status: DC
  Administered 2024-06-02 – 2024-06-05 (×7): 1 via ORAL
  Filled 2024-06-01 (×7): qty 1

## 2024-06-01 MED ORDER — CHLORHEXIDINE GLUCONATE 0.12 % MT SOLN: 15.0000 mL | Freq: Once | OROMUCOSAL | Status: AC

## 2024-06-01 MED ORDER — ACETAMINOPHEN 500 MG PO TABS: 1000.0000 mg | ORAL_TABLET | Freq: Once | ORAL | Status: AC

## 2024-06-01 MED ORDER — TRANEXAMIC ACID-NACL 1000-0.7 MG/100ML-% IV SOLN
INTRAVENOUS | Status: AC
  Filled 2024-06-01: qty 100

## 2024-06-01 MED ORDER — ENOXAPARIN SODIUM 40 MG/0.4ML IJ SOSY
40.0000 mg | PREFILLED_SYRINGE | INTRAMUSCULAR | Status: DC
  Administered 2024-06-02 – 2024-06-05 (×4): 40 mg via SUBCUTANEOUS
  Filled 2024-06-01 (×4): qty 0.4

## 2024-06-01 MED ORDER — PHENYLEPHRINE 80 MCG/ML (10ML) SYRINGE FOR IV PUSH (FOR BLOOD PRESSURE SUPPORT)
PREFILLED_SYRINGE | INTRAVENOUS | Status: DC | PRN
  Administered 2024-06-01: 80 ug via INTRAVENOUS

## 2024-06-01 MED ORDER — PROPOFOL 500 MG/50ML IV EMUL
INTRAVENOUS | Status: DC | PRN
  Administered 2024-06-01: 100 ug/kg/min via INTRAVENOUS

## 2024-06-01 MED ORDER — CHLORHEXIDINE GLUCONATE 0.12 % MT SOLN
OROMUCOSAL | Status: AC
  Administered 2024-06-01: 15 mL via OROMUCOSAL
  Filled 2024-06-01: qty 15

## 2024-06-01 MED ORDER — LIDOCAINE 2% (20 MG/ML) 5 ML SYRINGE
INTRAMUSCULAR | Status: DC | PRN
  Administered 2024-06-01: 60 mg via INTRAVENOUS

## 2024-06-01 MED ORDER — OXYCODONE HCL 5 MG PO TABS: 5.0000 mg | ORAL_TABLET | Freq: Once | ORAL | Status: DC | PRN

## 2024-06-01 MED ORDER — ROCURONIUM BROMIDE 10 MG/ML (PF) SYRINGE
PREFILLED_SYRINGE | INTRAVENOUS | Status: DC | PRN
  Administered 2024-06-01: 50 mg via INTRAVENOUS

## 2024-06-01 MED ORDER — TRANEXAMIC ACID-NACL 1000-0.7 MG/100ML-% IV SOLN
1000.0000 mg | INTRAVENOUS | Status: AC
  Administered 2024-06-01: 1000 mg via INTRAVENOUS

## 2024-06-01 MED ORDER — STERILE WATER FOR IRRIGATION IR SOLN
Status: DC | PRN
  Administered 2024-06-01: 2000 mL

## 2024-06-01 MED ORDER — SUGAMMADEX SODIUM 200 MG/2ML IV SOLN
INTRAVENOUS | Status: DC | PRN
  Administered 2024-06-01: 200 mg via INTRAVENOUS

## 2024-06-01 MED ORDER — ONDANSETRON HCL 4 MG/2ML IJ SOLN: 4.0000 mg | Freq: Once | INTRAMUSCULAR | Status: DC | PRN

## 2024-06-01 MED ORDER — ENSURE PLUS HIGH PROTEIN PO LIQD
237.0000 mL | Freq: Two times a day (BID) | ORAL | Status: DC
  Administered 2024-06-02 – 2024-06-05 (×6): 237 mL via ORAL

## 2024-06-01 MED ORDER — OXYCODONE HCL 5 MG/5ML PO SOLN: 5.0000 mg | Freq: Once | ORAL | Status: DC | PRN

## 2024-06-01 MED ORDER — 0.9 % SODIUM CHLORIDE (POUR BTL) OPTIME
TOPICAL | Status: DC | PRN
  Administered 2024-06-01: 1000 mL

## 2024-06-01 MED ORDER — ORAL CARE MOUTH RINSE: 15.0000 mL | Freq: Once | OROMUCOSAL | Status: AC

## 2024-06-01 MED ORDER — ONDANSETRON HCL 4 MG/2ML IJ SOLN
INTRAMUSCULAR | Status: DC | PRN
  Administered 2024-06-01: 4 mg via INTRAVENOUS

## 2024-06-01 MED ORDER — FENTANYL CITRATE (PF) 100 MCG/2ML IJ SOLN
INTRAMUSCULAR | Status: AC
  Filled 2024-06-01: qty 2

## 2024-06-01 MED ORDER — FENTANYL CITRATE (PF) 250 MCG/5ML IJ SOLN
INTRAMUSCULAR | Status: AC
  Filled 2024-06-01: qty 5

## 2024-06-01 MED ORDER — FENTANYL CITRATE (PF) 100 MCG/2ML IJ SOLN
25.0000 ug | INTRAMUSCULAR | Status: DC | PRN
  Administered 2024-06-01 (×3): 25 ug via INTRAVENOUS

## 2024-06-01 SURGICAL SUPPLY — 37 items
ALCOHOL 70% 16 OZ (MISCELLANEOUS) ×1 IMPLANT
BAG COUNTER SPONGE SURGICOUNT (BAG) ×1 IMPLANT
BIT DRILL INTERTAN LAG SCREW (BIT) IMPLANT
BIT DRILL LONG 4.0 (BIT) IMPLANT
BNDG COHESIVE 6X5 TAN ST LF (GAUZE/BANDAGES/DRESSINGS) ×2 IMPLANT
CANISTER SUCTION 3000ML PPV (SUCTIONS) ×1 IMPLANT
COVER PERINEAL POST (MISCELLANEOUS) ×1 IMPLANT
COVER SURGICAL LIGHT HANDLE (MISCELLANEOUS) ×1 IMPLANT
DRAPE C-ARM 42X72 X-RAY (DRAPES) ×1 IMPLANT
DRAPE HALF SHEET 40X57 (DRAPES) IMPLANT
DRAPE INCISE IOBAN 66X45 STRL (DRAPES) ×1 IMPLANT
DRAPE STERI IOBAN 125X83 (DRAPES) ×1 IMPLANT
DRSG ADAPTIC 3X8 NADH LF (GAUZE/BANDAGES/DRESSINGS) ×1 IMPLANT
DRSG TEGADERM 4X4.75 (GAUZE/BANDAGES/DRESSINGS) IMPLANT
DURAPREP 26ML APPLICATOR (WOUND CARE) ×1 IMPLANT
ELECT CAUTERY BLADE 6.4 (BLADE) ×1 IMPLANT
ELECTRODE REM PT RTRN 9FT ADLT (ELECTROSURGICAL) ×1 IMPLANT
GAUZE SPONGE 4X4 12PLY STRL (GAUZE/BANDAGES/DRESSINGS) IMPLANT
GAUZE SPONGE 4X4 12PLY STRL LF (GAUZE/BANDAGES/DRESSINGS) ×1 IMPLANT
GAUZE XEROFORM 1X8 LF (GAUZE/BANDAGES/DRESSINGS) IMPLANT
GLOVE BIO SURGEON STRL SZ7.5 (GLOVE) ×2 IMPLANT
GLOVE BIOGEL PI IND STRL 8 (GLOVE) ×2 IMPLANT
GOWN STRL REUS W/ TWL LRG LVL3 (GOWN DISPOSABLE) ×1 IMPLANT
KIT BASIN OR (CUSTOM PROCEDURE TRAY) ×1 IMPLANT
KIT TURNOVER KIT B (KITS) ×1 IMPLANT
NAIL INTERTAN 10X18 130D 10S (Nail) IMPLANT
PACK GENERAL/GYN (CUSTOM PROCEDURE TRAY) ×1 IMPLANT
PAD ARMBOARD POSITIONER FOAM (MISCELLANEOUS) ×2 IMPLANT
PIN GUIDE 3.2X343MM (PIN) IMPLANT
SCREW LAG COMPR KIT 95/90 (Screw) IMPLANT
SCREW TRIGEN LOW PROF 5.0X30 (Screw) IMPLANT
SOLN 0.9% NACL POUR BTL 1000ML (IV SOLUTION) ×1 IMPLANT
SOLN STERILE WATER BTL 1000 ML (IV SOLUTION) ×1 IMPLANT
STAPLER SKIN PROX 35W (STAPLE) ×1 IMPLANT
SUT MON AB 2-0 CT1 36 (SUTURE) ×1 IMPLANT
TOWEL GREEN STERILE (TOWEL DISPOSABLE) ×1 IMPLANT
TOWEL GREEN STERILE FF (TOWEL DISPOSABLE) ×1 IMPLANT

## 2024-06-01 NOTE — Anesthesia Preprocedure Evaluation (Addendum)
 Anesthesia Evaluation  Patient identified by MRN, date of birth, ID band Patient confused  General Assessment Comment:Daughter at bedside for interview and consent   Reviewed: Allergy & Precautions, Patient's Chart, lab work & pertinent test results  History of Anesthesia Complications (+) PONV and history of anesthetic complications  Airway Mallampati: II  TM Distance: >3 FB Neck ROM: Full    Dental  (+) Edentulous Upper   Pulmonary neg pulmonary ROS   Pulmonary exam normal breath sounds clear to auscultation       Cardiovascular (-) angina (-) Past MI negative cardio ROS Normal cardiovascular exam Rhythm:Regular Rate:Normal     Neuro/Psych neg Seizures negative neurological ROS  negative psych ROS   GI/Hepatic Neg liver ROS,GERD  Medicated,,  Endo/Other  Hypothyroidism    Renal/GU negative Renal ROS     Musculoskeletal  (+) Arthritis ,  Right Hip Fx    Abdominal   Peds  Hematology Hg 13.9   Anesthesia Other Findings   Reproductive/Obstetrics                              Anesthesia Physical Anesthesia Plan  ASA: 2  Anesthesia Plan: General   Post-op Pain Management: Tylenol  PO (pre-op)*   Induction: Intravenous  PONV Risk Score and Plan: 4 or greater and Treatment may vary due to age or medical condition, Ondansetron  and TIVA  Airway Management Planned: Oral ETT  Additional Equipment: None  Intra-op Plan:   Post-operative Plan: Extubation in OR  Informed Consent: I have reviewed the patients History and Physical, chart, labs and discussed the procedure including the risks, benefits and alternatives for the proposed anesthesia with the patient or authorized representative who has indicated his/her understanding and acceptance.     Consent reviewed with POA  Plan Discussed with: CRNA and Anesthesiologist  Anesthesia Plan Comments:          Anesthesia Quick  Evaluation

## 2024-06-01 NOTE — Transfer of Care (Signed)
 Immediate Anesthesia Transfer of Care Note  Patient: Donna Daniels  Procedure(s) Performed: FIXATION, FRACTURE, INTERTROCHANTERIC, WITH INTRAMEDULLARY ROD (Right)  Patient Location: PACU  Anesthesia Type:General  Level of Consciousness: awake, alert , drowsy, and patient cooperative  Airway & Oxygen Therapy: Patient Spontanous Breathing  Post-op Assessment: Report given to RN, Post -op Vital signs reviewed and stable, and Patient moving all extremities X 4  Post vital signs: Reviewed and stable  Last Vitals:  Vitals Value Taken Time  BP 154/74 06/01/24 21:13  Temp    Pulse 90 06/01/24 21:15  Resp 16 06/01/24 21:15  SpO2 98 % 06/01/24 21:15  Vitals shown include unfiled device data.  Last Pain:  Vitals:   06/01/24 1804  TempSrc:   PainSc: 6       Patients Stated Pain Goal: 2 (05/31/24 1305)  Complications: No notable events documented.

## 2024-06-01 NOTE — Op Note (Signed)
.  Orthopaedic Surgery Operative Note (CSN: [id number])  Donna Daniels  [birth year] Date of Surgery: 06/01/2024   Diagnoses:  right hip intertrochanteric fracture  Procedure: Right hip cephalomedullary nail insertion (CPT [phone])   Operative Finding Successful completion of the planned procedure.    Post-operative plan: The patient will be Weight bearing as tolerated right leg.  The patient will be evaluated by physical therapy.  DVT prophylaxis Lovenox 40 mg/day until mobilizing and then consider transition in clinic to alternative medicines.   Pain control with PRN pain medication preferring oral medicines.  Follow up plan will be scheduled in approximately 14 days for incision check and XR.  Post-Op Diagnosis: Same Surgeons:Primary: Sherida Adine BROCKS, MD Assistants:None Location: [facility] OR ROOM 12 Anesthesia: General Antibiotics: Ancef  2 g Tourniquet time: N/A Estimated Blood Loss: 200 cc Complications: None Specimens: None Implants: Implant Name Type Inv. Item Serial No. Manufacturer Lot No. LRB No. Used Action  NAIL INTERTAN 10X18 130D 10S - [id number] Nail NAIL INTERTAN 10X18 130D 10S  [employer] [id number] Right 1 Implanted  SCREW LAG COMPR KIT 95/90 - [id number] Screw SCREW LAG COMPR KIT 95/90  [employer] [id number] Right 1 Implanted  SCREW TRIGEN LOW PROF 5.0X30 - [id number] Screw SCREW TRIGEN LOW PROF 5.0X30  [employer] [id number] Right 1 Implanted    Indications for Surgery:   Donna Daniels is a 83 y.o. female with right hip intertrochanteric fracture after mechanical fall.  She was indicated for a right hip cephalomedullary nail insertion.  Benefits of surgery were discussed with the patient and her family and all questions answered.  These risk include but are not limited to infection, bleeding, damage to surrounding structures, DVT, stiffness, prolonged rehabilitation, failure of hardware, prominent hardware, malunion,  nonunion, need for additional treatments or surgeries and cardiopulmonary complications from anesthesia including stroke, myocardial infarction or even death.  The patient understands this and wished to proceed with surgery   Procedure:   The patient was identified properly. Informed consent was obtained and the surgical site was marked. The patient was taken up to suite where general anesthesia was induced. The patient was placed supine on a fracture table and appropriate reduction was obtained and visualized on fluoroscopy prior to the beginning of the procedure. The right leg was prepped and draped in the usual sterile fashion.  Timeout was performed before the beginning of the case.  We made an incision proximal to the greater trochanter and dissected down through the fascia.  We then carefully placed our starting guidewire localizing under fluoroscopy prior to advancing the wire into the bone.  An entry reamer was used to access the femoral canal.   We selected a length of nail noted above.  At this point we placed our nail localizing under fluoroscopy that it was at the appropriate level prior to using the outrigger device to pass a wire and then to appropriate depth for the cephalomedullary screw and compression screw which were inserted without complication.  The screw was locked proximally to avoid over collapse.  We took final shots at the proximal femur and then used the outrigger to place one distal interlock screw.  Final pictures were obtained.  The wounds were thoroughly irrigated closed in a multilayer fashion with absorable sutures.  A sterile dressing was placed.  The patient was awoken from general anesthesia and taken to the PACU in stable condition without complication.

## 2024-06-01 NOTE — Plan of Care (Signed)

## 2024-06-01 NOTE — Progress Notes (Signed)
 Initial Nutrition Assessment  DOCUMENTATION CODES:   Severe malnutrition in context of acute illness/injury  INTERVENTION:  Encourage po intake once able to have diet advanced Ensure Plus High Protein po BID, each supplement provides 350 kcal and 20 grams of protein 1 packet Juven BID, each packet provides 95 calories, 2.5 grams of protein (collagen),  Magic cup TID with meals, each supplement provides 290 kcal and 9 grams of protein MVI with minerals daily  1,000 mg Vitamin D daily 1  Oscal with vitamin D tablet BID Order vitamin D labs to ensure no deficiency  High calorie, high protein handout in AVS  NUTRITION DIAGNOSIS:   Severe Malnutrition related to acute illness as evidenced by severe muscle depletion, moderate fat depletion, percent weight loss (- 18 lbs, 10.5% in 3 months).   GOAL:   Patient will meet greater than or equal to 90% of their needs   MONITOR:   Diet advancement, PO intake, Skin, Labs, Weight trends  REASON FOR ASSESSMENT:   Consult Assessment of nutrition requirement/status, Hip fracture protocol  ASSESSMENT:   83 y.o female with PMH of HLD, arthritis, bilateral knee replacement.  With recent closed right hip fracture and repair August 2025. Presents with right hip pain after GLF, found to have right hip fx.  Pt resting in bed with daughter and grandson at bedside. Pt nauseous this morning and reports she vomited. Pt lives by herself and cooks her own meals at home. Her daughter stops by occasionally to help. Pt and family report her appetite has not been great in the past 6 months. Pt typically only eats 2 meals per day Breakfast and dinner, does not seem to snack in between, no protein supplements at home. Per family she has started to cut her meals in half. Typically eats soup, cornbread, vegetables as a meal.  Reports she experiences early satiety. Encouraged small frequent meals to help with this.   Limited weight history available but does seem  to be losing weight in which family agrees with. Pt has lost 18 lbs, 10.5% in 3 months from 03/10/24. Exam showed moderate fat and severe muscle wasting. Pt was using a walker or a cane PTA.   Discussed increased calories and protein related to wound healing. Pt willing to try protein supplements once able to have diet. Family reports pt is not much of a meat eater but does like eggs. Provided varying ways to increase protein including nut butter, oils, full fat dairy, etc.  Plan for OR possibly today.   Takes OSCAL BID, MVI daily, 1,000 units Vitamin D, 400 units Vitamin E daily,  at home per home medications will resume here.   Admit weight: 68.5 kg Current weight: 68.5 kg      Wt Readings from Last 10 Encounters:  05/31/24 68.5 kg  03/16/24 74.2 kg  03/10/24 74.2 kg  02/24/24 76.7 kg  02/19/24 76.9 kg  07/17/13 71.8 kg  04/13/13 73 kg  12/14/11 70.8 kg  - 18 lbs, 10.5%  in 3 months  Average Meal Intake: NPO  Nutritionally Relevant Medications: Scheduled Meds:  calcium -vitamin D  2 tablet Oral Q lunch   cholecalciferol   1,000 Units Oral Daily   [START ON 06/02/2024] feeding supplement  237 mL Oral BID BM   levothyroxine   88 mcg Oral Daily   multivitamin with minerals  1 tablet Oral Daily   [START ON 06/02/2024] nutrition supplement (JUVEN)  1 packet Oral BID BM   vitamin E  400 Units Oral Daily  Labs Reviewed: CBG ranges from 121-121 mg/dL over the last 24 hours HgbA1c 5.7  NUTRITION - FOCUSED PHYSICAL EXAM:  Flowsheet Row Most Recent Value  Orbital Region Moderate depletion  Upper Arm Region Moderate depletion  Thoracic and Lumbar Region Moderate depletion  Buccal Region Moderate depletion  Temple Region Moderate depletion  Clavicle Bone Region Severe depletion  Clavicle and Acromion Bone Region Severe depletion  Scapular Bone Region Moderate depletion  Dorsal Hand Moderate depletion  Patellar Region Moderate depletion  Anterior Thigh Region Moderate depletion   Posterior Calf Region Unable to assess  Edema (RD Assessment) None  Hair Reviewed  Eyes Reviewed  Mouth Reviewed  Skin Reviewed  Nails Reviewed    Diet Order:   Diet Order             Diet NPO time specified Except for: Sips with Meds  Diet effective midnight                   EDUCATION NEEDS:   Education needs have been addressed  Skin:  Skin Assessment: Reviewed RN Assessment  Last BM:  PTA  Height:   Ht Readings from Last 1 Encounters:  05/31/24 5' 5 (1.651 m)    Weight:   Wt Readings from Last 1 Encounters:  05/31/24 68.5 kg    Ideal Body Weight:  56.8 kg  BMI:  Body mass index is 25.13 kg/m.  Estimated Nutritional Needs:   Kcal:  1600-1800 kcal  Protein:  80-100 gm  Fluid:  >1.6L/day   Donna Daniels, RD Registered Dietitian  See Amion for more information

## 2024-06-01 NOTE — TOC CAGE-AID Note (Signed)
 Transition of Care [facility]) - CAGE-AID Screening   Patient Details  Name: Donna Daniels MRN: [id number] Date of Birth: 08/01/1940  Transition of Care [facility]) CM/SW Contact:    Jacqueleen Pulver E Kailynn Satterly, LCSW Phone Number: 06/01/2024, 10:08 AM   Clinical Narrative:    CAGE-AID Screening:    Have You Ever Felt You Ought to Cut Down on Your Drinking or Drug Use?: No Have People Annoyed You By [employer] Your Drinking Or Drug Use?: No Have You Felt Bad Or Guilty About Your Drinking Or Drug Use?: No Have You Ever Had a Drink or Used Drugs First Thing In The Morning to Steady Your Nerves or to Get Rid of a Hangover?: No CAGE-AID Score: 0  Substance Abuse Education Offered: No

## 2024-06-01 NOTE — Consult Note (Signed)
 Donna Daniels  ORTHOPAEDIC CONSULTATION  REQUESTING PHYSICIAN: Swayze, Ava, DO  ASSESSMENT AND PLAN: 83 y.o. female with the following: Right Hip Intertrochanteric femur fracture  This patient requires inpatient admission to the hospitalist, to include preoperative clearance and perioperative medical management  - Weight Bearing Status/Activity: NWB Right lower extremity  - Additional recommended labs/tests: Preop Labs: CBC, BMP, PT/INR, Chest XR, and EKG  -VTE Prophylaxis: Please hold prior to OR; to resume POD#1 at the discretion of the primary team  - Pain control: Recommend PO pain medications PRN; judicious use of narcotics  - Follow-up plan: F/u 10-14 days postop  -Procedures: Plan for OR once patient has been medically optimized  Plan for Right Cephalomedullary nail     Chief Complaint: Right hip pain  HPI: Donna Daniels is a 83 y.o. female who presented to the ED for evaluation after sustaining a mechanical fall.  She is complaining of Right hip pain.   Past Medical History:  Diagnosis Date   Arthritis    Bruises easily    Cataracts, bilateral    Closed fracture of patella 03/06/2010   Qualifier: Diagnosis of   By: Margrette MD, [place] cholesterol    takes SImvastin  daily   History of bronchitis    last time about 49yrs ago   Hypothyroidism    takes Synthroid  daily   Joint pain    Joint swelling    PONV (postoperative nausea and vomiting)    Sciatica    Thyroid  disorder    Past Surgical History:  Procedure Laterality Date   ABDOMINAL HYSTERECTOMY     ANTERIOR CERVICAL DECOMP/DISCECTOMY FUSION  12/18/2011   Procedure: ANTERIOR CERVICAL DECOMPRESSION/DISCECTOMY FUSION 2 LEVELS;  Surgeon: Darina MALVA Boehringer, MD;  Location: [facility];  Service: Neurosurgery;  Laterality: N/A;  Anterior cervical decompression fusion four-six   CESAREAN SECTION     x 1    COLONOSCOPY     HAND SURGERY Left    INTRAMEDULLARY (IM) NAIL INTERTROCHANTERIC Left 02/20/2024    Procedure: FIXATION, FRACTURE, INTERTROCHANTERIC, WITH INTRAMEDULLARY ROD;  Surgeon: Elsa Lonni SAUNDERS, MD;  Location: [facility] OR;  Service: Orthopedics;  Laterality: Left;   THYROIDECTOMY     TOTAL KNEE ARTHROPLASTY Right 04/18/2013   Procedure: TOTAL KNEE ARTHROPLASTY;  Surgeon: Maude KANDICE Herald, MD;  Location: [facility] OR;  Service: Orthopedics;  Laterality: Right;   TOTAL KNEE ARTHROPLASTY Left 07/25/2013   Procedure: TOTAL KNEE ARTHROPLASTY;  Surgeon: Maude KANDICE Herald, MD;  Location: [facility] OR;  Service: Orthopedics;  Laterality: Left;   VESICOVAGINAL FISTULA CLOSURE W/ TAH     Social History   Socioeconomic History   Marital status: Widowed    Spouse name: Not on file   Number of children: Not on file   Years of education: Not on file   Highest education level: Not on file  Occupational History   Occupation: packaging   Tobacco Use   Smoking status: Never   Smokeless tobacco: Never  Substance and Sexual Activity   Alcohol use: No   Drug use: No   Sexual activity: Not on file  Other Topics Concern   Not on file  Social History Narrative   Not on file   Social Drivers of Health   Financial Resource Strain: Not on file  Food Insecurity: No Food Insecurity (05/31/2024)   Hunger Vital Sign    Worried About Running Out of Food in the Last Year: Never true    Ran  Out of Food in the Last Year: Never true  Transportation Needs: No Transportation Needs (05/31/2024)   PRAPARE - Administrator, Civil Service (Medical): No    Lack of Transportation (Non-Medical): No  Physical Activity: Not on file  Stress: Not on file  Social Connections: Moderately Integrated (05/31/2024)   Social Connection and Isolation Panel    Frequency of Communication with Friends and Family: More than three times a week    Frequency of Social Gatherings with Friends and Family: More than three times a week    Attends Religious Services: More than 4 times per year    Active Member of [employer] or  Organizations: Yes    Attends Banker Meetings: More than 4 times per year    Marital Status: Widowed   Family History  Problem Relation Age of Onset   Breast cancer Neg Hx    No Known Allergies Prior to Admission medications   Medication Sig Start Date End Date Taking? Authorizing Provider  acetaminophen  (TYLENOL ) 500 MG tablet Take 500 mg by mouth every 6 (six) hours as needed for mild pain (pain score 1-3) or moderate pain (pain score 4-6).   Yes [provider]  calcium -vitamin D (OSCAL) 250-125 MG-UNIT per tablet Take 2 tablets by mouth daily.   Yes [provider]  cholecalciferol  (VITAMIN D) 1000 UNITS tablet Take 1,000 Units by mouth in the morning and at bedtime.   Yes [provider]  cyanocobalamin (VITAMIN B12) 500 MCG tablet Take 500 mcg by mouth daily.   Yes [provider]  levothyroxine  (SYNTHROID ) 88 MCG tablet Take 88 mcg by mouth daily. 01/10/24  Yes [provider]  Magnesium 200 MG TABS Take 200 mg by mouth in the morning and at bedtime.   Yes [provider]  meloxicam (MOBIC) 15 MG tablet Take 15 mg by mouth daily.   Yes [provider]  Multiple Vitamin (MULTIVITAMIN WITH MINERALS) TABS Take 1 tablet by mouth daily.   Yes [provider]  omeprazole (PRILOSEC) 20 MG capsule Take 20 mg by mouth daily. 02/08/24  Yes [provider]  rosuvastatin  (CRESTOR ) 20 MG tablet Take 20 mg by mouth at bedtime. 02/02/24  Yes [provider]    Family History Reviewed and non-contributory, no pertinent history of problems with bleeding or anesthesia    Review of Systems No fevers or chills No numbness or tingling No chest pain No shortness of breath No bowel or bladder dysfunction No GI distress No headaches    OBJECTIVE  Vitals:Patient Vitals for the past 8 hrs:  BP Temp Pulse Resp SpO2  06/01/24 1504 (!) 120/58 98.2 F (36.8 C) 87 16 94 %   General: Alert, no  acute distress Cardiovascular: Extremities are warm Respiratory: No cyanosis, no use of accessory musculature Skin: No lesions in the area of chief complaint  Neurologic: Sensation intact distally  Psychiatric: Patient is competent for consent with normal mood and affect Lymphatic: No swelling obvious and reported other than the area involved in the exam below Extremities  Right LE: Extremity held in a fixed position.  ROM deferred due to known fracture.  Sensation is intact distally in the sural, saphenous, DP, SP, and plantar nerve distribution. 2+ DP pulse.  Toes are WWP.  Active motion intact in the TA/EHL/GS. Left LE: Sensation is intact distally in the sural, saphenous, DP, SP, and plantar nerve distribution. 2+ DP pulse.  Toes are WWP.  Active motion intact in  the TA/EHL/GS. Tolerates gentle ROM of the hip.  No pain with axial loading.     Test Results Imaging DG Chest 1 View Result Date: 05/31/2024 EXAM: 1 VIEW(S) XRAY OF THE CHEST 05/31/2024 02:20:00 PM COMPARISON: 02/19/2024 CLINICAL HISTORY: [id number] Pain [id number] Pain FINDINGS: LUNGS AND PLEURA: No focal pulmonary opacity. No pleural effusion. No pneumothorax. HEART AND MEDIASTINUM: No acute abnormality of the cardiac and mediastinal silhouettes. BONES AND SOFT TISSUES: No acute osseous abnormality. IMPRESSION: 1. No acute cardiopulmonary disease. Electronically signed by: Lynwood Seip MD 05/31/2024 02:56 PM EST RP Workstation: [id number]   DG Hip Unilat W or Wo Pelvis 2-3 Views Right Result Date: 05/31/2024 EXAM: 2 or 3 VIEW(S) XRAY OF THE RIGHT HIP 05/31/2024 02:20:00 PM COMPARISON: None available. CLINICAL HISTORY: fall, r hip pain FINDINGS: BONES AND JOINTS: Moderately displaced and comminuted fracture is seen involving the intertrochanteric region of the proximal right femur. SOFT TISSUES: The soft tissues are unremarkable. IMPRESSION: 1. Moderately displaced comminuted intertrochanteric fracture of the proximal right femur.  Electronically signed by: Lynwood Seip MD 05/31/2024 02:55 PM EST RP Workstation: [id number]   Labs cbc Recent Labs    05/31/24 1432  WBC 10.5  HGB 13.9  HCT 44.5  PLT 265    Labs inflam No results for input(s): CRP in the last 72 hours.  Invalid input(s): ESR  Labs coag No results for input(s): INR, PTT in the last 72 hours.  Invalid input(s): PT  Recent Labs    05/31/24 1432  NA 142  K 4.3  CL 102  CO2 26  GLUCOSE 121*  BUN 13  CREATININE 0.77  CALCIUM  10.4*

## 2024-06-01 NOTE — Anesthesia Postprocedure Evaluation (Signed)
 Anesthesia Post Note  Patient: Donna Daniels  Procedure(s) Performed: FIXATION, FRACTURE, INTERTROCHANTERIC, WITH INTRAMEDULLARY ROD (Right)     Patient location during evaluation: PACU Anesthesia Type: General Level of consciousness: awake Pain management: pain level controlled Vital Signs Assessment: post-procedure vital signs reviewed and stable Respiratory status: spontaneous breathing, nonlabored ventilation, respiratory function stable and patient connected to nasal cannula oxygen Cardiovascular status: blood pressure returned to baseline and stable Postop Assessment: no apparent nausea or vomiting Anesthetic complications: no   No notable events documented.  Last Vitals:  Vitals:   06/01/24 2130 06/01/24 2145  BP: (!) 157/87 135/88  Pulse: 91 94  Resp: 17 11  Temp:  36.4 C  SpO2: 94% 93%    Last Pain:  Vitals:   06/01/24 2145  TempSrc:   PainSc: 6                  Thom JONELLE Peoples

## 2024-06-01 NOTE — Progress Notes (Signed)
  Progress Note   Patient: Donna Daniels FMW:[id number] DOB: 16-Sep-1940 DOA: 05/31/2024     1 DOS: the patient was seen and examined on 06/01/2024   Brief hospital course: The patient is a 83 yr old woman who presented to the [facility] ED on 05/31/2024 with a complaint of right hip pain following a fall while the patient was ambulating without the aid of her walker or cane.   She has a past medical history significant for hypothyroidism, hyperlipidemia, history of bilateral knee replacement. The patient also had a closed right hip fracture on August 2025 for which she underwent a riod fixation on 02/20/2024. She was to be using a walker or cane at home for ambulation.  In the ED she was found to have a right hip fracture. Dr. Sherida was consulted for orthopedic surgery. Plan is for surgery later today. She has been NPO overnight.   Assessment and Plan: Right hip fracture status post mechanical fall - Secondary to mechanical fall. - The patient has been N.p.o. after midnight. - Plan for OR later today, [place] orthopedic Dr. Sherida consulted - As needed pain regimen - Continue with IV fluids. - No cardiac history.  No further workup needed before surgery. -  SCD for DVT prophylaxis for now, pharmacologic can be started after surgery per Ortho discretion - Continue with calcium  and vitamin D    Hyperlipidemia - Continue with home statin   Hypothyroidism - Continue with home Synthroid   Subjective: The patient is resting comfortably. No new complains.  Physical Exam: Vitals:   05/31/24 2323 06/01/24 0331 06/01/24 0723 06/01/24 1504  BP: (!) 143/68 132/72 118/62 (!) 120/58  Pulse: 90 89 88 87  Resp: 16 19 16 16   Temp: 98.2 F (36.8 C) (!) 97.4 F (36.3 C) 97.6 F (36.4 C) 98.2 F (36.8 C)  TempSrc:  Oral    SpO2: 95% 100% 98% 94%  Weight:      Height:       Exam:  Constitutional:  The patient is awake, alert, and oriented x 3. No acute distress. Respiratory:  No increased work of  breathing. No wheezes, rales, or rhonchi No tactile fremitus Cardiovascular:  Regular rate and rhythm No murmurs, ectopy, or gallups. No lateral PMI. No thrills. Abdomen:  Abdomen is soft, non-tender, non-distended No hernias, masses, or organomegaly Normoactive bowel sounds.  Musculoskeletal:  No cyanosis, clubbing, or edema Skin:  No rashes, lesions, ulcers palpation of skin: no induration or nodules Neurologic:  CN 2-12 intact Sensation all 4 extremities intact Psychiatric:  Mental status Mood, affect appropriate Orientation to person, place, time  judgment and insight appear intact  Data Reviewed:  CBC, BMP  Family Communication: Family at bedside  Disposition: Status is: Inpatient Remains inpatient appropriate because: Awaiting ORIF of hip  Planned Discharge Destination: Skilled nursing facility    Time spent: 36 minutes  Author: Janee Ureste, DO 06/01/2024 4:35 PM  For on call review [email or web address].

## 2024-06-01 NOTE — Anesthesia Procedure Notes (Signed)
 Procedure Name: Intubation Date/Time: 06/01/2024 7:50 PM  Performed by: Zelphia Norleen HERO, CRNAPre-anesthesia Checklist: Patient identified, Emergency Drugs available, Suction available and Patient being monitored Patient Re-evaluated:Patient Re-evaluated prior to induction Oxygen Delivery Method: Circle system utilized Preoxygenation: Pre-oxygenation with 100% oxygen Induction Type: IV induction Ventilation: Mask ventilation without difficulty Laryngoscope Size: Mac and 3 Grade View: Grade I Tube type: Oral Tube size: 7.0 mm Number of attempts: 1 Airway Equipment and Method: Stylet Placement Confirmation: ETT inserted through vocal cords under direct vision, positive ETCO2 and breath sounds checked- equal and bilateral Secured at: 21 cm Tube secured with: Tape Dental Injury: Teeth and Oropharynx as per pre-operative assessment

## 2024-06-02 ENCOUNTER — Encounter (HOSPITAL_COMMUNITY): Payer: Self-pay | Admitting: Orthopedic Surgery

## 2024-06-02 DIAGNOSIS — S72002A Fracture of unspecified part of neck of left femur, initial encounter for closed fracture: Secondary | ICD-10-CM

## 2024-06-02 LAB — BASIC METABOLIC PANEL WITH GFR
Anion gap: 6 (ref 5–15)
BUN: 17 mg/dL (ref 8–23)
CO2: 30 mmol/L (ref 22–32)
Calcium: 8.5 mg/dL — ABNORMAL LOW (ref 8.9–10.3)
Chloride: 99 mmol/L (ref 98–111)
Creatinine, Ser: 0.83 mg/dL (ref 0.44–1.00)
GFR, Estimated: >60 mL/min (ref >60)
Glucose, Bld: 176 mg/dL — ABNORMAL HIGH (ref 70–99)
Potassium: 3.8 mmol/L (ref 3.5–5.1)
Sodium: 135 mmol/L (ref 135–145)

## 2024-06-02 LAB — CBC WITH DIFFERENTIAL/PLATELET
Abs Immature Granulocytes: 0.04 K/uL (ref 0.00–0.07)
Basophils Absolute: 0 K/uL (ref 0.0–0.1)
Basophils Relative: 0 %
Eosinophils Absolute: 0 K/uL (ref 0.0–0.5)
Eosinophils Relative: 0 %
HCT: 32.7 % — ABNORMAL LOW (ref 36.0–46.0)
Hemoglobin: 10.5 g/dL — ABNORMAL LOW (ref 12.0–15.0)
Immature Granulocytes: 0 %
Lymphocytes Relative: 12 %
Lymphs Abs: 1.2 K/uL (ref 0.7–4.0)
MCH: 28.5 pg (ref 26.0–34.0)
MCHC: 32.1 g/dL (ref 30.0–36.0)
MCV: 88.9 fL (ref 80.0–100.0)
Monocytes Absolute: 1.4 K/uL — ABNORMAL HIGH (ref 0.1–1.0)
Monocytes Relative: 14 %
Neutro Abs: 7.2 K/uL (ref 1.7–7.7)
Neutrophils Relative %: 74 %
Platelets: 193 K/uL (ref 150–400)
RBC: 3.68 MIL/uL — ABNORMAL LOW (ref 3.87–5.11)
RDW: 14.6 % (ref 11.5–15.5)
WBC: 9.9 K/uL (ref 4.0–10.5)
nRBC: 0 % (ref 0.0–0.2)

## 2024-06-02 LAB — VITAMIN D 25 HYDROXY (VIT D DEFICIENCY, FRACTURES): Vit D, 25-Hydroxy: 59.24 ng/mL (ref 30–100)

## 2024-06-02 MED ORDER — MUSCLE RUB 10-15 % EX CREA
TOPICAL_CREAM | Freq: Two times a day (BID) | CUTANEOUS | Status: DC | PRN
  Filled 2024-06-02: qty 85

## 2024-06-02 MED ORDER — TROLAMINE SALICYLATE 10 % EX CREA
TOPICAL_CREAM | Freq: Two times a day (BID) | CUTANEOUS | Status: DC | PRN
  Filled 2024-06-02: qty 85

## 2024-06-02 NOTE — Progress Notes (Signed)
  Progress Note   Patient: Donna Daniels FMW:[id number] DOB: 05/07/1941 DOA: 05/31/2024     2 DOS: the patient was seen and examined on 06/02/2024   Brief hospital course: The patient is an 83 year old female with past medical history significant for hypothyroidism, hyperlipidemia, history of bilateral knee replacement.  Patient was admitted with right hip pain following a fall.  Workup revealed right hip fracture.  Patient has undergone right hip cephalomedullary nailing insertion for right hip intertrochanteric fracture.  06/02/2024: Orthopedic team is directing postop care.  Patient reports difficulty with fluids.  Assessment and Plan: Right hip fracture status post mechanical fall - Secondary to mechanical fall. - See above documentation. - Postop care as per orthopedic team. - Pain is controlled.   Hyperlipidemia - Continue Crestor    Hypothyroidism - Continue Synthroid   Subjective:  -Reports difficulty swallowing fluids. - Patient tells me that she was intubated yesterday for the surgery.  Physical Exam: Vitals:   06/01/24 2203 06/02/24 0034 06/02/24 0535 06/02/24 0737  BP: (!) 143/73 119/61 115/62 113/63  Pulse: 92 90 88 90  Resp: 16 16 16 18   Temp: 99.2 F (37.3 C) 97.6 F (36.4 C) 98.9 F (37.2 C) 97.9 F (36.6 C)  TempSrc: Oral  Oral Oral  SpO2: 98% 97% 95% 95%  Weight:      Height:       Exam:  Constitutional:  The patient is awake, alert, and oriented x 3. No acute distress. Respiratory:  Clear to auscultation.   Cardiovascular:  S1-S2.[name] Abdomen:  Abdomen is soft, non-tender.   Data Reviewed:  CBC, BMP  Family Communication: Family at bedside  Disposition: Status is: Inpatient Remains inpatient appropriate because: Awaiting ORIF of hip  Planned Discharge Destination: Skilled nursing facility    Time spent: 35 minutes  Author: [name], MD 06/02/2024 4:17 PM  For on call review [email or web address].

## 2024-06-02 NOTE — TOC Initial Note (Signed)
 Transition of Care [facility]) - Initial/Assessment Note    Patient Details  Name: Donna Daniels MRN: [id number] Date of Birth: [birth year]  [facility]) CM/SW Contact:    Bridget Cordella Simmonds, LCSW Phone Number: 06/02/2024, 4:14 PM  Clinical Narrative:    CSW met with pt regarding PT recommendation for SNF.  Pt from home alone, no current services.  Was at [facility] in August.  Is agreeable to SNF again, would like to return to [facility].  Permission given to send out referral in the hub.  Permission given to speak with daughter Delon, sister Marval.  Referral sent out in hub for SNF, message left for [facility] to review.                Expected Discharge Plan: Skilled Nursing Facility Barriers to Discharge: Continued Medical Work up, SNF Pending bed offer   Patient Goals and CMS Choice Patient states their goals for this hospitalization and ongoing recovery are:: figure out why I'm falling   Choice offered to / list presented to : Patient (requesting [facility])      Expected Discharge Plan and Services In-house Referral: Clinical Social Work   Post Acute Care Choice: Skilled Nursing Facility Living arrangements for the past 2 months: Single Family Home                                      Prior Living Arrangements/Services Living arrangements for the past 2 months: Single Family Home Lives with:: Self Patient language and need for interpreter reviewed:: Yes Do you feel safe going back to the place where you live?: Yes      Need for Family Participation in Patient Care: Yes (Comment) Care giver support system in place?: Yes (comment) Current home services: Other (comment) (none) Criminal Activity/Legal Involvement Pertinent to Current Situation/Hospitalization: No - Comment as needed  Activities of Daily Living   ADL Screening (condition at time of admission) Independently performs ADLs?: No Does the patient have a NEW difficulty with  bathing/dressing/toileting/self-feeding that is expected to last >3 days?: Yes (Initiates electronic notice to provider for possible OT consult) Does the patient have a NEW difficulty with getting in/out of bed, walking, or climbing stairs that is expected to last >3 days?: Yes (Initiates electronic notice to provider for possible PT consult) Does the patient have a NEW difficulty with communication that is expected to last >3 days?: No Is the patient deaf or have difficulty hearing?: No Does the patient have difficulty seeing, even when wearing glasses/contacts?: No Does the patient have difficulty concentrating, remembering, or making decisions?: No  Permission Sought/Granted Permission sought to share information with : Family Supports Permission granted to share information with : Yes, Verbal Permission Granted  Share Information with NAME: daughter Delon, sister Marval  Permission granted to share info w AGENCY: SNF        Emotional Assessment Appearance:: Appears stated age Attitude/Demeanor/Rapport: Engaged Affect (typically observed): Appropriate Orientation: : Oriented to Self, Oriented to Place, Oriented to  Time, Oriented to Situation      Admission diagnosis:  Closed left hip fracture (HCC) [S72.002A] Closed fracture of right hip, initial encounter (HCC) [S72.001A] Patient Active Problem List   Diagnosis Date Noted   Protein-calorie malnutrition, severe 06/01/2024   Neurocognitive deficits 03/16/2024   Multiple ecchymoses of thigh 03/02/2024   GERD without esophagitis 02/24/2024   Acute blood loss anemia (ABLA)  02/24/2024   Moderate protein malnutrition 02/24/2024   Chronic non-seasonal allergic rhinitis 02/24/2024   Closed left hip fracture (HCC) 02/19/2024   High cholesterol    Hypothyroidism    S/P total knee replacement 07/25/2013   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 [place] [address] Phone: [phone] Fax: [phone]  [facility] [phone] - [place], [place] - [address] AT [facility] OF [address] [place] [address] Phone: [phone] Fax: [phone]     Social Drivers of Health (SDOH) Social History: SDOH Screenings   Food Insecurity: No Food Insecurity (05/31/2024)  Housing: Low Risk  (05/31/2024)  Transportation Needs: No Transportation Needs (05/31/2024)  Utilities: Not At Risk (05/31/2024)  Social Connections: Moderately Integrated (05/31/2024)  Tobacco Use: Low Risk  (06/01/2024)   SDOH Interventions:     Readmission Risk Interventions     No data to display

## 2024-06-02 NOTE — Progress Notes (Signed)
.  Subjective: 1 Day Post-Op Procedure(s) (LRB): FIXATION, FRACTURE, INTERTROCHANTERIC, WITH INTRAMEDULLARY ROD (Right)  Doing well. Biggest complaint is throat pain and difficulty swallowing  Activity level:  WBAT Patient reports pain as moderate.    Objective: Vital signs in last 24 hours: Temp:  [97.6 F (36.4 C)-99.2 F (37.3 C)] 98.4 F (36.9 C) (12/05 1959) Pulse Rate:  [86-94] 90 (12/05 0737) Resp:  [11-18] 18 (12/05 1959) BP: (113-157)/(61-88) 119/64 (12/05 1959) SpO2:  [93 %-100 %] 95 % (12/05 1959)  Labs: Recent Labs    05/31/24 1432 06/02/24 0510  HGB 13.9 10.5*   Recent Labs    05/31/24 1432 06/02/24 0510  WBC 10.5 9.9  RBC 4.92 3.68*  HCT 44.5 32.7*  PLT 265 193   Recent Labs    05/31/24 1432 06/02/24 0510  NA 142 135  K 4.3 3.8  CL 102 99  CO2 26 30  BUN 13 17  CREATININE 0.77 0.83  GLUCOSE 121* 176*  CALCIUM  10.4* 8.5*   No results for input(s): LABPT, INR in the last 72 hours.  Physical Exam:  Neurologically intact ABD soft Neurovascular intact Sensation intact distally Intact pulses distally Dorsiflexion/Plantar flexion intact Incision: dressing C/D/I  Assessment/Plan:  1 Day Post-Op Procedure(s) (LRB): FIXATION, FRACTURE, INTERTROCHANTERIC, WITH INTRAMEDULLARY ROD (Right)  WBAT RLE Physical therapy Pain control as needed Lovenox  for DVT ppx, can be discharge on aspirin  81mg  BID for 6 weeks    Adine JAYSON Mon 06/02/2024, 8:02 PM

## 2024-06-02 NOTE — NC FL2 (Signed)
 [place]  MEDICAID FL2 LEVEL OF CARE FORM     IDENTIFICATION  Patient Name: Donna Daniels Birthdate: [birth year] Sex: female Admission Date (Current Location): 05/31/2024  [facility] and [place] Number:  Producer, Television/film/video and Address:  The . [facility], [address], [place], [place] [address]      Provider Number: [id number]  Attending Physician Name and Address:  Rosario Leatrice FERNS, MD  Relative Name and Phone Number:  Lumbra,Jennifer Daughter   [phone]    Current Level of Care: Hospital Recommended Level of Care: Skilled Nursing Facility Prior Approval Number:    Date Approved/Denied:   PASRR Number: [id number]  Discharge Plan: SNF    Current Diagnoses: Patient Active Problem List   Diagnosis Date Noted   Protein-calorie malnutrition, severe 06/01/2024   Neurocognitive deficits 03/16/2024   Multiple ecchymoses of thigh 03/02/2024   GERD without esophagitis 02/24/2024   Acute blood loss anemia (ABLA) 02/24/2024   Moderate protein malnutrition 02/24/2024   Chronic non-seasonal allergic rhinitis 02/24/2024   Closed left hip fracture (HCC) 02/19/2024   High cholesterol    Hypothyroidism    S/P total knee replacement 07/25/2013   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    Orientation RESPIRATION BLADDER Height & Weight     Self, Time, Situation, Place  Normal External catheter, Incontinent Weight: 151 lb 0.2 oz (68.5 kg) Height:  5' 5 (165.1 cm)  BEHAVIORAL SYMPTOMS/MOOD NEUROLOGICAL BOWEL NUTRITION STATUS        Diet (see discharge summary)  AMBULATORY STATUS COMMUNICATION OF NEEDS Skin   Total Care Verbally Surgical wounds                       Personal Care Assistance Level of Assistance  Bathing, Feeding, Dressing, Total care Bathing Assistance: Maximum assistance Feeding assistance: Limited assistance Dressing Assistance: Maximum assistance Total Care Assistance: Maximum assistance   Functional Limitations Info  Sight,  Hearing, Speech Sight Info: Adequate Hearing Info: Adequate Speech Info: Adequate    SPECIAL CARE FACTORS FREQUENCY  PT (By licensed PT), OT (By licensed OT)     PT Frequency: 5x week OT Frequency: 5x week            Contractures Contractures Info: Not present    Additional Factors Info  Code Status, Allergies Code Status Info: full Allergies Info: NKA           Current Medications (06/02/2024):  This is the current hospital active medication list Current Facility-Administered Medications  Medication Dose Route Frequency Provider Last Rate Last Admin   calcium -vitamin D  (OSCAL WITH D) 500-5 MG-MCG per tablet 2 tablet  2 tablet Oral Q lunch Lenon Elsie HERO, RPH   2 tablet at 06/02/24 1345   ceFAZolin  (ANCEF ) IVPB 1 g/50 mL premix  1 g Intravenous Q8H Sherida Adine BROCKS, MD   Stopped at 06/02/24 1418   cholecalciferol  (VITAMIN D3) 25 MCG (1000 UNIT) tablet 1,000 Units  1,000 Units Oral Daily Elgergawy, Dawood S, MD   1,000 Units at 06/02/24 9062   enoxaparin  (LOVENOX ) injection 40 mg  40 mg Subcutaneous Q24H Sherida Adine BROCKS, MD   40 mg at 06/02/24 1345   feeding supplement (ENSURE PLUS HIGH PROTEIN) liquid 237 mL  237 mL Oral BID BM Swayze, Ava, DO   237 mL at 06/02/24 1346   HYDROcodone -acetaminophen  (NORCO/VICODIN) 5-325 MG per tablet 1-2 tablet  1-2 tablet Oral Q6H PRN Elgergawy, Dawood S, MD   1 tablet at 06/02/24 [phone]  levothyroxine  (SYNTHROID ) tablet 88 mcg  88 mcg Oral Daily Elgergawy, Dawood S, MD   88 mcg at 06/02/24 0532   methocarbamol  (ROBAXIN ) tablet 500 mg  500 mg Oral Q6H PRN Elgergawy, Dawood S, MD   500 mg at 05/31/24 2124   Or   methocarbamol  (ROBAXIN ) injection 500 mg  500 mg Intravenous Q6H PRN Elgergawy, Dawood S, MD   500 mg at 06/02/24 1345   morphine  (PF) 2 MG/ML injection 1-2 mg  1-2 mg Intravenous Q4H PRN Elgergawy, Dawood S, MD   2 mg at 06/01/24 1653   multivitamin with minerals tablet 1 tablet  1 tablet Oral Daily Elgergawy, Dawood S, MD   1 tablet  at 06/02/24 9062   nutrition supplement (JUVEN) (JUVEN) powder packet 1 packet  1 packet Oral BID BM Swayze, Ava, DO   1 packet at 06/02/24 1345   pantoprazole  (PROTONIX ) EC tablet 40 mg  40 mg Oral Daily Elgergawy, Dawood S, MD   40 mg at 06/02/24 0937   polyethylene glycol (MIRALAX  / GLYCOLAX ) packet 17 g  17 g Oral Daily PRN Elgergawy, Dawood S, MD       rosuvastatin  (CRESTOR ) tablet 20 mg  20 mg Oral QHS Elgergawy, Dawood S, MD   20 mg at 06/01/24 2215   senna (SENOKOT) tablet 8.6 mg  1 tablet Oral BID Elgergawy, Dawood S, MD   8.6 mg at 06/02/24 9062   vitamin E  capsule 400 Units  400 Units Oral Daily Elgergawy, Dawood S, MD   400 Units at 06/02/24 9062     Discharge Medications: Please see discharge summary for a list of discharge medications.  Relevant Imaging Results:  Relevant Lab Results:   Additional Information SSN: [id number]  Bridget Cordella Simmonds, LCSW

## 2024-06-02 NOTE — Evaluation (Signed)
 Occupational Therapy Evaluation Patient Details Name: Donna Daniels MRN: [id number] DOB: [birth year] Today's Date: 06/02/2024   History of Present Illness   83 y.o. female admitted 05/31/24 after fall sustaining R hip fx s/p cephalomedullary nail insertion 12/4. PMHx: hypothyroidism, HLD, B TKA, ACDF 2013, and R hip fx 01/2024.     Clinical Impressions Pt presents with decline in function and safety with ADLs and ADL mobility with impaired strength, balance and endurance. Prior to August 2025, pt Ind with ADL/IADLs, cooking, home mgt, no AD for mobility,since that time pt has been Mod I using RW and shower chair since fall injuring her L hip. Pt went to SNF and recieved ST rehab before d/c home. Pt currently requires total A +2 to sit EOB, max A with UB ADLs, Poor siting balance, total A with LB ADLs, min A with grooming/hygiene, total A with toileting and max A +2 STS/SPTs. OT will follow acutely to maximize level of function and safety      If plan is discharge home, recommend the following:   A lot of help with bathing/dressing/bathroom;Two people to help with walking and/or transfers;Assistance with cooking/housework;Assist for transportation;Help with stairs or ramp for entrance     Functional Status Assessment   Patient has had a recent decline in their functional status and demonstrates the ability to make significant improvements in function in a reasonable and predictable amount of time.     Equipment Recommendations   Other (comment) (defer)     Recommendations for Other Services         Precautions/Restrictions   Precautions Precautions: Fall Recall of Precautions/Restrictions: Impaired Restrictions Weight Bearing Restrictions Per Provider Order: Yes     Mobility Bed Mobility Overal bed mobility: Needs Assistance Bed Mobility: Supine to Sit     Supine to sit: Total assist, +2 for physical assistance, HOB elevated     General bed mobility comments:  Pt initially assisted slightly to bring BLE towards EOB with support to manage RLE. She quickly fatigued and c/o pain. Pivoted pt to EOB with use of bed pad PT managing BLE and OT managing trunk posteriorly. Scooted fwd til feet flat on floor.    Transfers Overall transfer level: Needs assistance Equipment used: 2 person hand held assist Transfers: Sit to/from Stand, Bed to chair/wheelchair/BSC Sit to Stand: Max assist, +2 physical assistance Stand pivot transfers: Max assist, +2 physical assistance         General transfer comment: Pt stood from bed holding onto the posterior upper arms of PT/OT. Powered up with maxA x2 and aid of bed pad to facilitate full hip ext. Transferred to recliner chair on left positioned touching bed. Facilitated pivot turn and guided pt down to the chair. Repositioned her with use of bed pad in chair.      Balance Overall balance assessment: Needs assistance Sitting-balance support: Bilateral upper extremity supported, Feet supported Sitting balance-Leahy Scale: Poor Sitting balance - Comments: Pt sat EOB with min-modA to maintain upright posture. She demonstrated significant left lateral lean likely to offload R hip d/t pain. Postural control: Left lateral lean Standing balance support: Bilateral upper extremity supported, During functional activity Standing balance-Leahy Scale: Poor                             ADL either performed or assessed with clinical judgement   ADL Overall ADL's : Needs assistance/impaired Eating/Feeding: Set up;Sitting   Grooming: Wash/dry hands;Wash/dry face;Minimal assistance;Sitting  Upper Body Bathing: Maximal assistance   Lower Body Bathing: Total assistance   Upper Body Dressing : Maximal assistance   Lower Body Dressing: Total assistance   Toilet Transfer: Maximal assistance;+2 for physical assistance;Stand-pivot   Toileting- Clothing Manipulation and Hygiene: Total assistance       Functional  mobility during ADLs: Maximal assistance;+2 for physical assistance;Cueing for safety       Vision Baseline Vision/History: 1 Wears glasses Ability to See in Adequate Light: 0 Adequate Patient Visual Report: No change from baseline       Perception         Praxis         Pertinent Vitals/Pain Pain Assessment Pain Assessment: 0-10 Pain Score: 7  Pain Location: R hip and L-side of neck Pain Descriptors / Indicators: Operative site guarding, Grimacing, Discomfort, Aching Pain Intervention(s): Limited activity within patient's tolerance, Monitored during session, Repositioned     Extremity/Trunk Assessment Upper Extremity Assessment Upper Extremity Assessment: Generalized weakness   Lower Extremity Assessment Lower Extremity Assessment: Defer to PT evaluation RLE Deficits / Details: Pt POD 1 s/p cephalomedullary nail insertion for femur fx. Pt with decreased hip and knee AROM as well as strength. Grossly 2/5. RLE: Unable to fully assess due to pain RLE Sensation: decreased proprioception RLE Coordination: decreased gross motor   Cervical / Trunk Assessment Cervical / Trunk Assessment: Other exceptions Cervical / Trunk Exceptions: Forward Head   Communication Communication Communication: Impaired Factors Affecting Communication: Hearing impaired   Cognition Arousal: Alert Behavior During Therapy: WFL for tasks assessed/performed                                 Following commands: Intact       Cueing  General Comments   Cueing Techniques: Verbal cues;Gestural cues  VSS on RA   Exercises     Shoulder Instructions      Home Living Family/patient expects to be discharged to:: Skilled nursing facility Living Arrangements: Alone Available Help at Discharge: Family;Available 24 hours/day Type of Home: House Home Access: Stairs to enter [employer] of Steps: 3 Entrance Stairs-Rails: Right;Left;Can reach both Home Layout: One level      Bathroom Shower/Tub: Producer, Television/film/video: Handicapped height     Home Equipment: Control And Instrumentation Engineer (2 wheels);Rollator (4 wheels)          Prior Functioning/Environment Prior Level of Function : Independent/Modified Independent;Driving             Mobility Comments: Prior to August, Indep no AD. Since injuring her L hip she's required additional support of RW. Pt went to SNF and recieved PT/OT before d/c home. She reports she was managing things well for herself since being home using a rollator. 2 falls resulting in hip fx in ~108mo. ADLs Comments: Prior to August, Ind ADL/IADLs, since has been Mod I    OT Problem List: Decreased strength;Decreased activity tolerance;Impaired balance (sitting and/or standing);Pain   OT Treatment/Interventions: Self-care/ADL training;Therapeutic exercise;Patient/family education;Balance training;Therapeutic activities;DME and/or AE instruction      OT Goals(Current goals can be found in the care plan section)   Acute Rehab OT Goals Patient Stated Goal: get better OT Goal Formulation: With patient Time For Goal Achievement: 06/16/24 Potential to Achieve Goals: Good ADL Goals Pt Will Perform Grooming: with contact guard assist;with supervision;sitting Pt Will Perform Upper Body Bathing: with mod assist;with min assist;sitting Pt Will Perform Lower Body Bathing: with max  assist;with mod assist;sitting/lateral leans Pt Will Perform Upper Body Dressing: with mod assist;with min assist;sitting Pt Will Transfer to Toilet: with max assist;with mod assist;stand pivot transfer;bedside commode   OT Frequency:  Min 2X/week    Co-evaluation PT/OT/SLP Co-Evaluation/Treatment: Yes Reason for Co-Treatment: For patient/therapist safety;To address functional/ADL transfers PT goals addressed during session: Mobility/safety with mobility;Balance OT goals addressed during session: ADL's and self-care;Proper use of  Adaptive equipment and DME      AM-PAC OT 6 Clicks Daily Activity     Outcome Measure Help from another person eating meals?: A Little Help from another person taking care of personal grooming?: A Little Help from another person toileting, which includes using toliet, bedpan, or urinal?: Total Help from another person bathing (including washing, rinsing, drying)?: A Lot Help from another person to put on and taking off regular upper body clothing?: A Lot Help from another person to put on and taking off regular lower body clothing?: Total 6 Click Score: 12   End of Session Equipment Utilized During Treatment: Gait belt Nurse Communication: Mobility status  Activity Tolerance: Patient limited by fatigue;Patient limited by pain Patient left: in chair;with call bell/phone within reach;with chair alarm set  OT Visit Diagnosis: Other abnormalities of gait and mobility (R26.89);Muscle weakness (generalized) (M62.81);History of falling (Z91.81)                Time: 8877-8856 OT Time Calculation (min): 21 min Charges:  OT General Charges $OT Visit: 1 Visit OT Evaluation $OT Eval Moderate Complexity: 1 Mod    Jacques Karna Loose 06/02/2024, 1:43 PM

## 2024-06-02 NOTE — Evaluation (Addendum)
 Physical Therapy Evaluation Patient Details Name: Donna Daniels MRN: [id number] DOB: 1941-04-03 Today's Date: 06/02/2024  History of Present Illness  83 y.o. female admitted 05/31/24 after fall sustaining R hip fx s/p cephalomedullary nail insertion 12/4. PMHx: hypothyroidism, HLD, B TKA, ACDF 2013, and R hip fx 01/2024.  Clinical Impression  Pt admitted with above diagnosis. PTA, pt was modI for functional mobility using a RW and modI for ADLs/IADLs. She lives aline in a one story house with 3 STE. Pt currently with functional limitations due to the deficits listed below (see PT Problem List). She required totalA x2 for bed mobility and maxA x2 for transfers. Pt is currently limited by R hip pain, impaired balance, generalized weakness, and decreased activity tolerance. Pt will benefit from acute skilled PT to increase her independence and safety with mobility to allow discharge. Given pt's CLOF, home set-up, and high fall risk recommend continued inpatient follow up therapy, <3 hours/day.    If plan is discharge home, recommend the following: Two people to help with walking and/or transfers;A lot of help with bathing/dressing/bathroom;Assistance with cooking/housework;Assist for transportation;Help with stairs or ramp for entrance   Can travel by private vehicle   No    Equipment Recommendations Hospital bed;Hoyer lift;Wheelchair (measurements PT);Wheelchair cushion (measurements PT)  Recommendations for Other Services       Functional Status Assessment Patient has had a recent decline in their functional status and demonstrates the ability to make significant improvements in function in a reasonable and predictable amount of time.     Precautions / Restrictions Precautions Precautions: Fall Recall of Precautions/Restrictions: Impaired Restrictions Weight Bearing Restrictions Per Provider Order: Yes RLE Weight Bearing Per Provider Order: Weight bearing as tolerated      Mobility   Bed Mobility Overal bed mobility: Needs Assistance Bed Mobility: Supine to Sit     Supine to sit: Total assist, +2 for physical assistance, HOB elevated     General bed mobility comments: Pt initially assisted slightly to bring BLE towards EOB with support to manage RLE. She quickly fatigued and c/o pain. Pivoted pt to EOB with use of bed pad PT managing BLE and OT managing trunk posteriorly. Scooted fwd til feet flat on floor.    Transfers Overall transfer level: Needs assistance Equipment used: 2 person hand held assist Transfers: Sit to/from Stand, Bed to chair/wheelchair/BSC Sit to Stand: Max assist, +2 physical assistance Stand pivot transfers: Max assist, +2 physical assistance         General transfer comment: Pt stood from bed holding onto the posterior upper arms of PT/OT. Powered up with maxA x2 and aid of bed pad to facilitate full hip ext. Transferred to recliner chair on left positioned touching bed. Facilitated pivot turn and guided pt down to the chair. Repositioned her with use of bed pad in chair.    Ambulation/Gait               General Gait Details: Unable  Stairs            Wheelchair Mobility     Tilt Bed    Modified Rankin (Stroke Patients Only)       Balance Overall balance assessment: Needs assistance Sitting-balance support: Bilateral upper extremity supported, Feet supported Sitting balance-Leahy Scale: Poor Sitting balance - Comments: Pt sat EOB with min-modA to maintain upright posture. She demonstrated significant left lateral lean likely to offload R hip d/t pain. Postural control: Left lateral lean Standing balance support: Bilateral upper extremity supported, During functional  activity Standing balance-Leahy Scale: Poor Standing balance comment: Pt required maxA x2 to transfer.                             Pertinent Vitals/Pain Pain Assessment Pain Assessment: 0-10 Pain Score: 7  Pain Location: R hip and  L-side of neck Pain Descriptors / Indicators: Operative site guarding, Grimacing, Discomfort, Aching Pain Intervention(s): Monitored during session, Limited activity within patient's tolerance, Repositioned    Home Living Family/patient expects to be discharged to:: Skilled nursing facility Living Arrangements: Alone Available Help at Discharge: Family;Available 24 hours/day Type of Home: House Home Access: Stairs to enter Entrance Stairs-Rails: Right;Left;Can reach both Entrance Stairs-Number of Steps: 3   Home Layout: One level Home Equipment: Control And Instrumentation Engineer (2 wheels);Rollator (4 wheels)      Prior Function Prior Level of Function : Independent/Modified Independent;Driving             Mobility Comments: Prior to August, Indep no AD. Since injuring her L hip she's required additional support of RW. Pt went to SNF and recieved PT/OT before d/c home. She reports she was managing things well for herself since being home using a rollator. 2 falls resulting in hip fx in ~16mo. ADLs Comments: Prior to August, Indep ADL/IADLs. Since has been modI.     Extremity/Trunk Assessment   Upper Extremity Assessment Upper Extremity Assessment: Defer to OT evaluation    Lower Extremity Assessment Lower Extremity Assessment: Generalized weakness;RLE deficits/detail RLE Deficits / Details: Pt POD 1 s/p cephalomedullary nail insertion for femur fx. Pt with decreased hip and knee AROM as well as strength. Grossly 2/5. RLE: Unable to fully assess due to pain RLE Sensation: decreased proprioception RLE Coordination: decreased gross motor    Cervical / Trunk Assessment Cervical / Trunk Assessment: Other exceptions Cervical / Trunk Exceptions: Forward Head (Pt resting in cervical extension)  Communication   Communication Communication: Impaired Factors Affecting Communication: Hearing impaired    Cognition Arousal: Alert Behavior During Therapy: WFL for tasks  assessed/performed   PT - Cognitive impairments: No apparent impairments                       PT - Cognition Comments: Pt A,Ox4 Following commands: Intact       Cueing Cueing Techniques: Verbal cues, Gestural cues     General Comments General comments (skin integrity, edema, etc.): VSS on RA    Exercises     Assessment/Plan    PT Assessment Patient needs continued PT services  PT Problem List Decreased strength;Decreased range of motion;Decreased activity tolerance;Decreased balance;Decreased mobility;Pain       PT Treatment Interventions DME instruction;Gait training;Therapeutic activities;Functional mobility training;Therapeutic exercise;Balance training;Patient/family education;Wheelchair mobility training    PT Goals (Current goals can be found in the Care Plan section)  Acute Rehab PT Goals Patient Stated Goal: Have less pain, regain strength and mobility PT Goal Formulation: With patient Time For Goal Achievement: 06/16/24 Potential to Achieve Goals: Good    Frequency Min 2X/week     Co-evaluation PT/OT/SLP Co-Evaluation/Treatment: Yes Reason for Co-Treatment: For patient/therapist safety;To address functional/ADL transfers PT goals addressed during session: Mobility/safety with mobility;Balance         AM-PAC PT 6 Clicks Mobility  Outcome Measure Help needed turning from your back to your side while in a flat bed without using bedrails?: Total Help needed moving from lying on your back to sitting on the side of a flat  bed without using bedrails?: Total Help needed moving to and from a bed to a chair (including a wheelchair)?: Total Help needed standing up from a chair using your arms (e.g., wheelchair or bedside chair)?: Total Help needed to walk in hospital room?: Total Help needed climbing 3-5 steps with a railing? : Total 6 Click Score: 6    End of Session Equipment Utilized During Treatment: Gait belt Activity Tolerance: Patient  tolerated treatment well;Patient limited by pain;Patient limited by fatigue Patient left: in chair;with call bell/phone within reach;with chair alarm set Nurse Communication: Mobility status;Need for lift equipment Laurent) PT Visit Diagnosis: Difficulty in walking, not elsewhere classified (R26.2);Muscle weakness (generalized) (M62.81);Other abnormalities of gait and mobility (R26.89);Unsteadiness on feet (R26.81);Pain Pain - Right/Left: Right Pain - part of body: Hip    Time: 8877-8856 PT Time Calculation (min) (ACUTE ONLY): 21 min   Charges:   PT Evaluation $PT Eval Moderate Complexity: 1 Mod   PT General Charges $$ ACUTE PT VISIT: 1 Visit         Randall SAUNDERS, PT, DPT Acute Rehabilitation Services Office: [phone] Secure Chat Preferred  Delon CHRISTELLA Callander 06/02/2024, 1:31 PM

## 2024-06-03 DIAGNOSIS — S72002D Fracture of unspecified part of neck of left femur, subsequent encounter for closed fracture with routine healing: Secondary | ICD-10-CM

## 2024-06-03 LAB — CBC WITH DIFFERENTIAL/PLATELET
Abs Immature Granulocytes: 0.02 K/uL (ref 0.00–0.07)
Basophils Absolute: 0 K/uL (ref 0.0–0.1)
Basophils Relative: 0 %
Eosinophils Absolute: 0 K/uL (ref 0.0–0.5)
Eosinophils Relative: 0 %
HCT: 33.1 % — ABNORMAL LOW (ref 36.0–46.0)
Hemoglobin: 10.6 g/dL — ABNORMAL LOW (ref 12.0–15.0)
Immature Granulocytes: 0 %
Lymphocytes Relative: 14 %
Lymphs Abs: 1.6 K/uL (ref 0.7–4.0)
MCH: 28.4 pg (ref 26.0–34.0)
MCHC: 32 g/dL (ref 30.0–36.0)
MCV: 88.7 fL (ref 80.0–100.0)
Monocytes Absolute: 2 K/uL — ABNORMAL HIGH (ref 0.1–1.0)
Monocytes Relative: 17 %
Neutro Abs: 8 K/uL — ABNORMAL HIGH (ref 1.7–7.7)
Neutrophils Relative %: 69 %
Platelets: 185 K/uL (ref 150–400)
RBC: 3.73 MIL/uL — ABNORMAL LOW (ref 3.87–5.11)
RDW: 14.4 % (ref 11.5–15.5)
WBC: 11.6 K/uL — ABNORMAL HIGH (ref 4.0–10.5)
nRBC: 0 % (ref 0.0–0.2)

## 2024-06-03 NOTE — Progress Notes (Signed)
 PATIENT ID: Donna Daniels  MRN: [id number]  DOB/AGE:  [birth year] / 83 y.o.  2 Days Post-Op Procedure(s) (LRB): FIXATION, FRACTURE, INTERTROCHANTERIC, WITH INTRAMEDULLARY ROD (Right)    PROGRESS NOTE Subjective: Patient is alert, oriented, no Nausea, no Vomiting, yes passing gas, . Taking PO well. Denies SOB, Chest or Calf Pain. Using Incentive Spirometer, PAS in place. Ambulate WBAT, Yesterday's total administered Morphine  Milligram Equivalents: 15), Patient reports pain as  mild to moderate.    Biggest complaint remains throat pain and difficulty swallowing   Objective: Vital signs in last 24 hours: Vitals:   06/02/24 0737 06/02/24 1959 06/03/24 0452 06/03/24 0735  BP: 113/63 119/64 (!) 127/59 (!) 128/57  Pulse: 90  92 93  Resp: 18 18 20 15   Temp: 97.9 F (36.6 C) 98.4 F (36.9 C) 97.8 F (36.6 C) 98.2 F (36.8 C)  TempSrc: Oral Oral    SpO2: 95% 95% 96% 97%  Weight:      Height:          Intake/Output from previous day: I/O last 3 completed shifts: In: 600 [I.V.:500; IV Piggyback:100] Out: 400 [Urine:400]   Intake/Output this shift: No intake/output data recorded.   LABORATORY DATA: Recent Labs    05/31/24 1432 06/02/24 0510 06/03/24 0313  WBC 10.5 9.9 11.6*  HGB 13.9 10.5* 10.6*  HCT 44.5 32.7* 33.1*  PLT 265 193 185  NA 142 135  --   K 4.3 3.8  --   CL 102 99  --   CO2 26 30  --   BUN 13 17  --   CREATININE 0.77 0.83  --   GLUCOSE 121* 176*  --   CALCIUM  10.4* 8.5*  --     Examination: Neurologically intact Neurovascular intact Sensation intact distally Intact pulses distally Dorsiflexion/Plantar flexion intact Incision: no drainage No cellulitis present Compartment soft}   Assessment:   2 Days Post-Op Procedure(s) (LRB): FIXATION, FRACTURE, INTERTROCHANTERIC, WITH INTRAMEDULLARY ROD (Right) ADDITIONAL DIAGNOSIS:  Expected Acute Blood Loss Anemia, hypothyroidism, HLD, B TKA, ACDF 2013, and L hip fx 01/2024  Anticipated LOS equal to or  greater than 2 midnights due to - Age 83 and older with one or more of the following:  - Obesity  - Expected need for hospital services (PT, OT, Nursing) required for safe  discharge  - Anticipated need for postoperative skilled nursing care or inpatient rehab   Plan: PT/OT WBAT,   DVT Prophylaxis: Lovenox  for DVT ppx, can be discharge on aspirin  81mg  BID for 6 weeks   DISCHARGE PLAN: Skilled Nursing Facility/Rehab  DISCHARGE NEEDS: Vannie

## 2024-06-03 NOTE — Progress Notes (Signed)
  Progress Note   Patient: Donna Daniels FMW:[id number] DOB: 1941/01/12 DOA: 05/31/2024     3 DOS: the patient was seen and examined on 06/03/2024   Brief hospital course: The patient is an 83 year old female with past medical history significant for hypothyroidism, hyperlipidemia, history of bilateral knee replacement.  Patient was admitted with right hip pain following a fall.  Workup revealed right hip fracture.  Patient has undergone right hip cephalomedullary nailing insertion for right hip intertrochanteric fracture.  06/02/2024: Orthopedic team is directing postop care.  Patient reports difficulty with fluids. 06/03/2024: Pain is controlled.  Throat pain has improved.  Assessment and Plan: Right hip fracture status post mechanical fall - Secondary to mechanical fall. - See above documentation. - Postop care as per orthopedic team. - Pain is controlled.   Hyperlipidemia - Continue Crestor    Hypothyroidism - Continue Synthroid   Subjective:  - No new complaints. - Throat pain has improved significantly.    Physical Exam: Vitals:   06/02/24 0737 06/02/24 1959 06/03/24 0452 06/03/24 0735  BP: 113/63 119/64 (!) 127/59 (!) 128/57  Pulse: 90  92 93  Resp: 18 18 20 15   Temp: 97.9 F (36.6 C) 98.4 F (36.9 C) 97.8 F (36.6 C) 98.2 F (36.8 C)  TempSrc: Oral Oral    SpO2: 95% 95% 96% 97%  Weight:      Height:       Exam:  Constitutional:  The patient is awake, alert, and oriented x 3. No acute distress. Respiratory:  Clear to auscultation.   Cardiovascular:  S1-S2.[name] Abdomen:  Abdomen is soft, non-tender.   Data Reviewed:  CBC, BMP  Family Communication: Family at bedside  Disposition: Status is: Inpatient Remains inpatient appropriate because: Awaiting ORIF of hip  Planned Discharge Destination: Skilled nursing facility    Time spent: 35 minutes  Author: [name], MD 06/03/2024 1:00 PM  For on call review [email or web address].

## 2024-06-03 NOTE — Plan of Care (Signed)
  Problem: Education: Goal: Knowledge of General Education information will improve Description: Including pain rating scale, medication(s)/side effects and non-pharmacologic comfort measures Outcome: Progressing   Problem: Health Behavior/Discharge Planning: Goal: Ability to manage health-related needs will improve Outcome: Progressing   Problem: Clinical Measurements: Goal: Will remain free from infection Outcome: Progressing   Problem: Activity: Goal: Risk for activity intolerance will decrease Outcome: Progressing   Problem: Coping: Goal: Level of anxiety will decrease Outcome: Progressing   Problem: Elimination: Goal: Will not experience complications related to urinary retention Outcome: Progressing   Problem: Pain Managment: Goal: General experience of comfort will improve and/or be controlled Outcome: Progressing   Problem: Safety: Goal: Ability to remain free from injury will improve Outcome: Progressing   Problem: Skin Integrity: Goal: Risk for impaired skin integrity will decrease Outcome: Progressing

## 2024-06-04 DIAGNOSIS — S72002D Fracture of unspecified part of neck of left femur, subsequent encounter for closed fracture with routine healing: Secondary | ICD-10-CM | POA: Diagnosis not present

## 2024-06-04 NOTE — Progress Notes (Signed)
.  Subjective: 3 Days Post-Op Procedure(s) (LRB): FIXATION, FRACTURE, INTERTROCHANTERIC, WITH INTRAMEDULLARY ROD (Right)  Doing well. Biggest complaint remains throat pain  Activity level:  WBAT Patient reports pain as moderate.    Objective: Vital signs in last 24 hours: Temp:  [97.6 F (36.4 C)-98.5 F (36.9 C)] 98.2 F (36.8 C) (12/07 0754) Pulse Rate:  [91-93] 93 (12/07 0754) Resp:  [17-20] 17 (12/07 0754) BP: (114-128)/(54-64) 120/58 (12/07 0754) SpO2:  [95 %-97 %] 95 % (12/07 0754)  Labs: Recent Labs    06/02/24 0510 06/03/24 0313  HGB 10.5* 10.6*   Recent Labs    06/02/24 0510 06/03/24 0313  WBC 9.9 11.6*  RBC 3.68* 3.73*  HCT 32.7* 33.1*  PLT 193 185   Recent Labs    06/02/24 0510  NA 135  K 3.8  CL 99  CO2 30  BUN 17  CREATININE 0.83  GLUCOSE 176*  CALCIUM  8.5*   No results for input(s): LABPT, INR in the last 72 hours.  Physical Exam:  Neurologically intact ABD soft Neurovascular intact Sensation intact distally Intact pulses distally Dorsiflexion/Plantar flexion intact Incision: dressing C/D/I  Assessment/Plan:  3 Days Post-Op Procedure(s) (LRB): FIXATION, FRACTURE, INTERTROCHANTERIC, WITH INTRAMEDULLARY ROD (Right)  WBAT RLE PT/OT DVT Ppx: continue lovenox  daily, transition to aspirin  81mg  BID for 6 weeks post op Discharge pending SNF    Inpatient Morphine  Milligram Equivalents Per Day 12/3 - 12/7   Values displayed are in units of MME/Day    Order Start / End Date 12/3 12/4 12/5 Yesterday Today    oxyCODONE  (Oxy IR/ROXICODONE ) immediate release tablet 5 mg 12/4 - 12/4 -- 0 of Unknown -- -- --    oxyCODONE  (ROXICODONE ) 5 MG/5ML solution 5 mg 12/4 - 12/4 -- 0 of Unknown -- -- --       Group total: 0 of Unknown       fentaNYL  (SUBLIMAZE ) injection 50 mcg 12/3 - 12/3 15 of 15 -- -- -- --    morphine  (PF) 4 MG/ML injection 4 mg 12/3 - 12/3 12 of 12 -- -- -- --    morphine  (PF) 2 MG/ML injection 1-2 mg 12/3 - No end date 0 of  6-12 12 of 18-36 0 of 18-36 0 of 18-36 0 of 18-36    morphine  (PF) 2 MG/ML injection 2 mg 12/3 - 12/3 6 of 6 -- -- -- --    HYDROcodone -acetaminophen  (NORCO/VICODIN) 5-325 MG per tablet 1-2 tablet 12/3 - No end date 10 of 5-10 0 of 20-40 15 of 20-40 5 of 20-40 10 of 20-40    morphine  (PF) 4 MG/ML injection 4 mg 12/3 - 12/3 12 of 12 -- -- -- --    fentaNYL  (SUBLIMAZE ) injection 25-50 mcg 12/4 - 12/4 -- 22.5 of 45-90 -- -- --    fentaNYL  citrate (PF) (SUBLIMAZE ) injection 12/4 - 12/4 -- *30 of 30 -- -- --    Daily Totals  55 of 56-67 * 64.5 of Unknown (at least 113-196) 15 of 38-76 5 of 38-76 10 of 38-76  *One-Step medication  Calculation Errors     Order Type Date Details   oxyCODONE  (Oxy IR/ROXICODONE ) immediate release tablet 5 mg Ordered Dose -- Insufficient frequency information   oxyCODONE  (ROXICODONE ) 5 MG/5ML solution 5 mg Ordered Dose -- Insufficient frequency information                Adine JAYSON Mon 06/04/2024, 10:29 AM

## 2024-06-04 NOTE — Progress Notes (Signed)
  Progress Note   Patient: Donna Daniels FMW:[id number] DOB: 1941-04-03 DOA: 05/31/2024     4 DOS: the patient was seen and examined on 06/04/2024   Brief hospital course: The patient is an 83 year old female with past medical history significant for hypothyroidism, hyperlipidemia, history of bilateral knee replacement.  Patient was admitted with right hip pain following a fall.  Workup revealed right hip fracture.  Patient has undergone right hip cephalomedullary nailing insertion for right hip intertrochanteric fracture.  06/02/2024: Orthopedic team is directing postop care.  Patient reports difficulty with fluids. 06/04/2024: Patient seen.  No new complaints.  Pain is controlled.  Throat pain has resolved.  Pursue disposition  Assessment and Plan: Right hip fracture status post mechanical fall - Secondary to mechanical fall. - See above documentation. - Postop care as per orthopedic team. - Pain is controlled. -Pursue disposition   Hyperlipidemia - Continue Crestor    Hypothyroidism - Continue Synthroid   Subjective:  - No new complaints. - Throat pain has i resolved.    Physical Exam: Vitals:   06/04/24 0026 06/04/24 0418 06/04/24 0754 06/04/24 1600  BP: (!) 114/54 119/64 (!) 120/58 129/61  Pulse: 91 91 93 94  Resp: 20 20 17 17   Temp: 97.6 F (36.4 C) 97.7 F (36.5 C) 98.2 F (36.8 C) 97.7 F (36.5 C)  TempSrc:   Oral Oral  SpO2: 96% 95% 95%   Weight:      Height:       Exam:  Constitutional:  The patient is awake, alert, and oriented x 3. No acute distress. Respiratory:  Clear to auscultation.   Cardiovascular:  S1-S2.Donna Daniels Abdomen:  Abdomen is soft, non-tender.   Data Reviewed:  CBC, BMP  Family Communication: Family at bedside  Disposition: Status is: Inpatient Remains inpatient appropriate because: Awaiting ORIF of hip  Planned Discharge Destination: Skilled nursing facility    Time spent: 35 minutes  Author: [name], MD 06/04/2024  5:21 PM  For on call review [email or web address].

## 2024-06-04 NOTE — Plan of Care (Signed)

## 2024-06-05 MED ORDER — SENNA 8.6 MG PO TABS: 1.0000 | ORAL_TABLET | Freq: Two times a day (BID) | ORAL | 0 refills | Status: DC

## 2024-06-05 MED ORDER — OYSTER SHELL CALCIUM/D3 500-5 MG-MCG PO TABS: 2.0000 | ORAL_TABLET | Freq: Every day | ORAL | 1 refills | Status: AC

## 2024-06-05 MED ORDER — PANTOPRAZOLE SODIUM 40 MG PO TBEC: 40.0000 mg | DELAYED_RELEASE_TABLET | Freq: Every day | ORAL | 0 refills | Status: DC

## 2024-06-05 MED ORDER — METHOCARBAMOL 500 MG PO TABS: 500.0000 mg | ORAL_TABLET | Freq: Four times a day (QID) | ORAL | 0 refills | Status: AC | PRN

## 2024-06-05 MED ORDER — VITAMIN E 180 MG (400 UNIT) PO CAPS: 400.0000 [IU] | ORAL_CAPSULE | Freq: Every day | ORAL | 1 refills | Status: AC

## 2024-06-05 MED ORDER — ASPIRIN 81 MG PO TBEC: 81.0000 mg | DELAYED_RELEASE_TABLET | Freq: Two times a day (BID) | ORAL | 0 refills | Status: DC

## 2024-06-05 MED ORDER — POLYETHYLENE GLYCOL 3350 17 G PO PACK: 17.0000 g | PACK | Freq: Every day | ORAL | 0 refills | Status: AC | PRN

## 2024-06-05 MED ORDER — MUSCLE RUB 10-15 % EX CREA: 1.0000 | TOPICAL_CREAM | Freq: Two times a day (BID) | CUTANEOUS | 0 refills | Status: DC | PRN

## 2024-06-05 MED ORDER — ENSURE PLUS HIGH PROTEIN PO LIQD: 237.0000 mL | Freq: Two times a day (BID) | ORAL | 0 refills | Status: AC

## 2024-06-05 MED ORDER — JUVEN PO PACK: 1.0000 | PACK | Freq: Two times a day (BID) | ORAL | 1 refills | Status: DC

## 2024-06-05 MED ORDER — ASPIRIN 81 MG PO TBEC: 81.0000 mg | DELAYED_RELEASE_TABLET | Freq: Every day | ORAL | 0 refills | Status: DC

## 2024-06-05 MED ORDER — HYDROCODONE-ACETAMINOPHEN 5-325 MG PO TABS: 1.0000 | ORAL_TABLET | Freq: Four times a day (QID) | ORAL | 0 refills | Status: DC | PRN

## 2024-06-05 NOTE — TOC Progression Note (Addendum)
 Transition of Care [facility]) - Progression Note    Patient Details  Name: Donna Daniels MRN: [id number] Date of Birth: August 10, 1940  [facility]) CM/SW Contact  Bridget Cordella Simmonds, LCSW Phone Number: 06/05/2024, 11:55 AM  Clinical Narrative:   [facility] does offer, pt informed and does want to accept this offer.   1345: CSW confirmed with [facility]: they can receive pt today.  SNF auth request submitted in [place] and approved: [id number], 3 days: 12/8-12/10.  MD informed.   Expected Discharge Plan: Skilled Nursing Facility Barriers to Discharge: Continued Medical Work up, SNF Pending bed offer               Expected Discharge Plan and Services In-house Referral: Clinical Social Work   Post Acute Care Choice: Skilled Nursing Facility Living arrangements for the past 2 months: Single Family Home                                       Social Drivers of Health (SDOH) Interventions SDOH Screenings   Food Insecurity: No Food Insecurity (05/31/2024)  Housing: Low Risk  (05/31/2024)  Transportation Needs: No Transportation Needs (05/31/2024)  Utilities: Not At Risk (05/31/2024)  Social Connections: Moderately Integrated (05/31/2024)  Tobacco Use: Low Risk  (06/01/2024)    Readmission Risk Interventions     No data to display

## 2024-06-05 NOTE — Plan of Care (Signed)

## 2024-06-05 NOTE — Progress Notes (Signed)
.  Subjective: 4 Days Post-Op Procedure(s) (LRB): FIXATION, FRACTURE, INTERTROCHANTERIC, WITH INTRAMEDULLARY ROD (Right)  Doing well. Throat pain significantly improved. SNF pending   Activity level:  WBAT Patient reports pain as moderate.    Objective: Vital signs in last 24 hours: Temp:  [97.7 F (36.5 C)-98.8 F (37.1 C)] 98.8 F (37.1 C) (12/08 0814) Pulse Rate:  [85-94] 86 (12/08 0814) Resp:  [16-18] 16 (12/08 0814) BP: (118-129)/(57-61) 118/59 (12/08 0814) SpO2:  [95 %-96 %] 96 % (12/08 0814)  Labs: Recent Labs    06/03/24 0313  HGB 10.6*   Recent Labs    06/03/24 0313  WBC 11.6*  RBC 3.73*  HCT 33.1*  PLT 185   No results for input(s): NA, K, CL, CO2, BUN, CREATININE, GLUCOSE, CALCIUM  in the last 72 hours. No results for input(s): LABPT, INR in the last 72 hours.  Physical Exam:   Neurologically intact ABD soft Neurovascular intact Sensation intact distally Intact pulses distally Dorsiflexion/Plantar flexion intact Incision: dressing C/D/I  Assessment/Plan:  4 Days Post-Op Procedure(s) (LRB): FIXATION, FRACTURE, INTERTROCHANTERIC, WITH INTRAMEDULLARY ROD (Right)  WBAT RLE PT/OT DVT Ppx: continue lovenox  daily, transition to aspirin  81mg  BID for 6 weeks post op Discharge pending SNF. Patient can follow up 2 weeks post op   Adine JAYSON Mon 06/05/2024, 12:53 PM

## 2024-06-05 NOTE — TOC Transition Note (Signed)
 Transition of Care [facility]) - Discharge Note   Patient Details  Name: Donna Daniels MRN: [id number] Date of Birth: 1940-08-16  Transition of Care [facility]) CM/SW Contact:  Bridget Cordella Simmonds, LCSW Phone Number: 06/05/2024, 3:07 PM   Clinical Narrative:   Pt discharging to [facility].  RN call report to [phone].  PTAR called [phone].      Final next level of care: Skilled Nursing Facility Barriers to Discharge: Barriers Resolved   Patient Goals and CMS Choice Patient states their goals for this hospitalization and ongoing recovery are:: figure out why I'm falling   Choice offered to / list presented to : Patient (requesting [facility])      Discharge Placement              Patient chooses bed at: [facility] Patient to be transferred to facility by: ptar Name of family member notified: daughter Delon Patient and family notified of of transfer: 06/05/24  Discharge Plan and Services Additional resources added to the After Visit Summary for   In-house Referral: Clinical Social Work   Post Acute Care Choice: Skilled Nursing Facility                               Social Drivers of Health (SDOH) Interventions SDOH Screenings   Food Insecurity: No Food Insecurity (05/31/2024)  Housing: Low Risk  (05/31/2024)  Transportation Needs: No Transportation Needs (05/31/2024)  Utilities: Not At Risk (05/31/2024)  Social Connections: Moderately Integrated (05/31/2024)  Tobacco Use: Low Risk  (06/01/2024)     Readmission Risk Interventions     No data to display

## 2024-06-05 NOTE — Progress Notes (Signed)
 Physical Therapy Treatment Patient Details Name: Donna Daniels MRN: [id number] DOB: Jan 17, 1941 Today's Date: 06/05/2024   History of Present Illness 83 y.o. female admitted 05/31/24 after fall sustaining R hip fx s/p cephalomedullary nail insertion 12/4. PMHx: hypothyroidism, HLD, B TKA, ACDF 2013, and R hip fx 01/2024.    PT Comments  Pt eager to mobilize out of bed to chair. Performed warm up BLE AAROM exercises while in supine. Requiring +2 assist for bed mobility and performed pre transfer training using Stedy to transition over to chair. Pt with fair activity tolerance throughout. Patient will benefit from continued inpatient follow up therapy, <3 hours/day in order to address deficits, maximize functional mobility and decrease caregiver burden.    If plan is discharge home, recommend the following: Two people to help with walking and/or transfers;A lot of help with bathing/dressing/bathroom;Assistance with cooking/housework;Assist for transportation;Help with stairs or ramp for entrance   Can travel by private vehicle     No  Equipment Recommendations  Hospital bed;Hoyer lift;Wheelchair (measurements PT);Wheelchair cushion (measurements PT)    Recommendations for Other Services       Precautions / Restrictions Precautions Precautions: Fall Recall of Precautions/Restrictions: Impaired Restrictions Weight Bearing Restrictions Per Provider Order: Yes RLE Weight Bearing Per Provider Order: Weight bearing as tolerated     Mobility  Bed Mobility Overal bed mobility: Needs Assistance Bed Mobility: Supine to Sit     Supine to sit: Max assist, +2 for physical assistance     General bed mobility comments: Pt assisted slightly to bring LLE towards edge of bed, use of bed pad to pivot hips, hand over hand guidance for locating bed rail and trunk elevation to upright    Transfers Overall transfer level: Needs assistance Equipment used: Ambulation equipment used Transfers: Sit  to/from Stand Sit to Stand: Max assist, +2 physical assistance           General transfer comment: MaxA + 2 to stand from edge of bed to [place] transfer from bed to chair Transfer via Lift Equipment: Stedy  Ambulation/Gait                   Stairs             Wheelchair Mobility     Tilt Bed    Modified Rankin (Stroke Patients Only)       Balance Overall balance assessment: Needs assistance Sitting-balance support: Bilateral upper extremity supported, Feet supported Sitting balance-Leahy Scale: Poor Sitting balance - Comments: CGA-minA                                    Communication Communication Communication: Impaired Factors Affecting Communication: Hearing impaired  Cognition Arousal: Alert Behavior During Therapy: WFL for tasks assessed/performed   PT - Cognitive impairments: Initiation                       PT - Cognition Comments: Slow processing Following commands: Impaired Following commands impaired: Follows one step commands with increased time    Cueing Cueing Techniques: Verbal cues  Exercises General Exercises - Lower Extremity Ankle Circles/Pumps: AROM, Both, 10 reps, Supine Quad Sets: AROM, Both, 5 reps, Supine Heel Slides: AAROM, Both, 5 reps, Supine Hip ABduction/ADduction: AAROM, Both, 5 reps, Supine    General Comments        Pertinent Vitals/Pain Pain Assessment Pain Assessment: Faces Faces Pain Scale: Hurts little more  Pain Location: R hip Pain Descriptors / Indicators: Operative site guarding, Grimacing, Discomfort, Aching Pain Intervention(s): Monitored during session    Home Living                          Prior Function            PT Goals (current goals can now be found in the care plan section) Acute Rehab PT Goals Patient Stated Goal: Have less pain, regain strength and mobility Potential to Achieve Goals: Fair    Frequency    Min 2X/week      PT  Plan      Co-evaluation              AM-PAC PT 6 Clicks Mobility   Outcome Measure  Help needed turning from your back to your side while in a flat bed without using bedrails?: Total Help needed moving from lying on your back to sitting on the side of a flat bed without using bedrails?: Total Help needed moving to and from a bed to a chair (including a wheelchair)?: Total Help needed standing up from a chair using your arms (e.g., wheelchair or bedside chair)?: Total Help needed to walk in hospital room?: Total Help needed climbing 3-5 steps with a railing? : Total 6 Click Score: 6    End of Session Equipment Utilized During Treatment: Gait belt Activity Tolerance: Patient tolerated treatment well Patient left: in chair;with call bell/phone within reach;with chair alarm set Nurse Communication: Mobility status PT Visit Diagnosis: Difficulty in walking, not elsewhere classified (R26.2);Muscle weakness (generalized) (M62.81);Other abnormalities of gait and mobility (R26.89);Unsteadiness on feet (R26.81);Pain Pain - Right/Left: Right Pain - part of body: Hip     Time: 1210-1230 PT Time Calculation (min) (ACUTE ONLY): 20 min  Charges:    $Therapeutic Activity: 8-22 mins PT General Charges $$ ACUTE PT VISIT: 1 Visit                     Aleck Daring, PT, DPT Acute Rehabilitation Services Office [phone]    Aleck ONEIDA Daring 06/05/2024, 1:18 PM

## 2024-06-05 NOTE — Plan of Care (Signed)
  Problem: Health Behavior/Discharge Planning: Goal: Ability to manage health-related needs will improve Outcome: Progressing   Problem: Clinical Measurements: Goal: Will remain free from infection Outcome: Progressing Goal: Cardiovascular complication will be avoided Outcome: Progressing   Problem: Activity: Goal: Risk for activity intolerance will decrease Outcome: Progressing   Problem: Nutrition: Goal: Adequate nutrition will be maintained Outcome: Progressing   Problem: Coping: Goal: Level of anxiety will decrease Outcome: Progressing

## 2024-06-05 NOTE — Discharge Summary (Signed)
 Physician Discharge Summary  Patient ID: Donna Daniels MRN: [id number] DOB/AGE: 83/02/1941 83 y.o.  Admit date: 05/31/2024 Discharge date: 06/05/2024  Admission Diagnoses:  Discharge Diagnoses:  Principal Problem:   Closed left hip fracture (HCC) Active Problems:   High cholesterol   Hypothyroidism   GERD without esophagitis   Protein-calorie malnutrition, severe   Discharged Condition: stable  Hospital Course:  The patient is an 83 year old female with past medical history significant for hypothyroidism, hyperlipidemia, history of bilateral knee replacement.  Patient was admitted with right hip pain following a fall.  Right hip x-ray revealed moderately displaced comminuted intertrochanteric fracture of the proximal right femur.  Patient has underwent right hip cephalomedullary nailing insertion for right hip fracture.  Orthopedic team is directing postop care.  Patient will be discharged to skilled nursing facility for subacute rehab.  Patient will be discharged on aspirin  81 mg p.o. twice daily for 6 weeks (DVT prophylaxis).  Patient will follow-up with primary care provider and orthopedic team within 1 to 2 weeks of discharge.  Right hip fracture status post mechanical fall - Secondary to mechanical fall. - Patient underwent orthopedic surgery as documented above.. - Postop care was as per orthopedic team. - Pain is controlled. - Patient will be discharged to skilled nursing facility for subacute rehab.      Hyperlipidemia - Continue Crestor    Hypothyroidism - Continue Synthroid     Consults: orthopedic surgery  Significant Diagnostic Studies:  X-ray of the right hip revealed: 1. Moderately displaced comminuted intertrochanteric fracture of the proximal right femur.   Treatments:  Right hip cephalomedullary nail insertion   Discharge Exam: Blood pressure (!) 118/59, pulse 86, temperature 98.8 F (37.1 C), resp. rate 16, height 5' 5 (1.651 m), weight 68.5 kg,  SpO2 96%.   Disposition: Discharge disposition: 03-Skilled Nursing Facility       Discharge Instructions     Diet - low sodium heart healthy   Complete by: As directed    Discharge wound care:   Complete by: As directed    Continue current wound care plan.   Increase activity slowly   Complete by: As directed       Allergies as of 06/05/2024   No Known Allergies      Medication List     STOP taking these medications    acetaminophen  500 MG tablet Commonly known as: TYLENOL    calcium -vitamin D  250-125 MG-UNIT tablet Commonly known as: OSCAL   cyanocobalamin 500 MCG tablet Commonly known as: VITAMIN B12   Magnesium 200 MG Tabs   meloxicam 15 MG tablet Commonly known as: MOBIC   omeprazole 20 MG capsule Commonly known as: PRILOSEC Replaced by: pantoprazole  40 MG tablet       TAKE these medications    aspirin  EC 81 MG tablet Take 1 tablet (81 mg total) by mouth 2 (two) times daily. Swallow whole.   calcium -vitamin D  500-5 MG-MCG tablet Commonly known as: OSCAL WITH D Take 2 tablets by mouth daily with lunch. Start taking on: June 06, 2024   cholecalciferol  1000 units tablet Commonly known as: VITAMIN D  Take 1,000 Units by mouth in the morning and at bedtime.   feeding supplement Liqd Take 237 mLs by mouth 2 (two) times daily between meals.   nutrition supplement (JUVEN) Pack Take 1 packet by mouth 2 (two) times daily between meals.   HYDROcodone -acetaminophen  5-325 MG tablet Commonly known as: NORCO/VICODIN Take 1-2 tablets by mouth every 6 (six) hours as needed for moderate pain (pain  score 4-6).   levothyroxine  88 MCG tablet Commonly known as: SYNTHROID  Take 88 mcg by mouth daily.   methocarbamol  500 MG tablet Commonly known as: ROBAXIN  Take 1 tablet (500 mg total) by mouth every 6 (six) hours as needed for up to 5 days for muscle spasms.   multivitamin with minerals Tabs tablet Take 1 tablet by mouth daily.   Muscle Rub 10-15 %  Crea Apply 1 Application topically 2 (two) times daily as needed for muscle pain.   pantoprazole  40 MG tablet Commonly known as: PROTONIX  Take 1 tablet (40 mg total) by mouth daily. Start taking on: June 06, 2024 Replaces: omeprazole 20 MG capsule   polyethylene glycol 17 g packet Commonly known as: MIRALAX  / GLYCOLAX  Take 17 g by mouth daily as needed for mild constipation.   rosuvastatin  20 MG tablet Commonly known as: CRESTOR  Take 20 mg by mouth at bedtime.   senna 8.6 MG Tabs tablet Commonly known as: SENOKOT Take 1 tablet (8.6 mg total) by mouth 2 (two) times daily.   vitamin E  180 MG (400 UNITS) capsule Commonly known as: vitamin E  Take 1 capsule (400 Units total) by mouth daily.               Discharge Care Instructions  (From admission, onward)           Start     Ordered   06/05/24 0000  Discharge wound care:       Comments: Continue current wound care plan.   06/05/24 1422            Contact information for after-discharge care     Destination     [facility] .   Service: Skilled Nursing Contact information: [address] [place]  [address] [phone]                    Time spent: 35 minutes.  SignedBETHA Leatrice LILLETTE Rosario 06/05/2024, 2:38 PM

## 2024-06-05 NOTE — Plan of Care (Signed)
 Patient is discharging to [facility], report called and AVS document placed in discharge packet.    Problem: Education: Goal: Knowledge of General Education information will improve Description: Including pain rating scale, medication(s)/side effects and non-pharmacologic comfort measures Outcome: Adequate for Discharge   Problem: Health Behavior/Discharge Planning: Goal: Ability to manage health-related needs will improve 06/05/2024 1523 by Kimber Selinda GAILS, RN Outcome: Adequate for Discharge 06/05/2024 1127 by Kimber Selinda GAILS, RN Outcome: Progressing   Problem: Clinical Measurements: Goal: Ability to maintain clinical measurements within normal limits will improve Outcome: Adequate for Discharge Goal: Will remain free from infection 06/05/2024 1523 by Kimber Selinda GAILS, RN Outcome: Adequate for Discharge 06/05/2024 1127 by Kimber Selinda GAILS, RN Outcome: Progressing Goal: Diagnostic test results will improve Outcome: Adequate for Discharge Goal: Respiratory complications will improve Outcome: Adequate for Discharge Goal: Cardiovascular complication will be avoided 06/05/2024 1523 by Kimber Selinda GAILS, RN Outcome: Adequate for Discharge 06/05/2024 1127 by Kimber Selinda GAILS, RN Outcome: Progressing   Problem: Activity: Goal: Risk for activity intolerance will decrease 06/05/2024 1523 by Kimber Selinda GAILS, RN Outcome: Adequate for Discharge 06/05/2024 1127 by Kimber Selinda GAILS, RN Outcome: Progressing   Problem: Nutrition: Goal: Adequate nutrition will be maintained 06/05/2024 1523 by Kimber Selinda GAILS, RN Outcome: Adequate for Discharge 06/05/2024 1127 by Kimber Selinda GAILS, RN Outcome: Progressing   Problem: Coping: Goal: Level of anxiety will decrease 06/05/2024 1523 by Kimber Selinda GAILS, RN Outcome: Adequate for Discharge 06/05/2024 1127 by Kimber Selinda GAILS, RN Outcome: Progressing   Problem: Elimination: Goal: Will not experience complications related to bowel motility Outcome: Adequate  for Discharge Goal: Will not experience complications related to urinary retention Outcome: Adequate for Discharge   Problem: Pain Managment: Goal: General experience of comfort will improve and/or be controlled Outcome: Adequate for Discharge   Problem: Safety: Goal: Ability to remain free from injury will improve Outcome: Adequate for Discharge   Problem: Skin Integrity: Goal: Risk for impaired skin integrity will decrease Outcome: Adequate for Discharge

## 2024-06-05 NOTE — Care Management Important Message (Signed)
 Important Message  Patient Details  Name: BRYLI MANTEY MRN: [id number] Date of Birth: 05-14-1941   Important Message Given:  Yes - Medicare IM     Jennie Laneta Dragon 06/05/2024, 12:34 PM

## 2024-06-06 ENCOUNTER — Encounter: Payer: Self-pay | Admitting: Adult Health

## 2024-06-06 ENCOUNTER — Non-Acute Institutional Stay (SKILLED_NURSING_FACILITY): Payer: Self-pay | Admitting: Adult Health

## 2024-06-06 DIAGNOSIS — E039 Hypothyroidism, unspecified: Secondary | ICD-10-CM

## 2024-06-06 DIAGNOSIS — K5909 Other constipation: Secondary | ICD-10-CM | POA: Insufficient documentation

## 2024-06-06 DIAGNOSIS — E7800 Pure hypercholesterolemia, unspecified: Secondary | ICD-10-CM

## 2024-06-06 DIAGNOSIS — E43 Unspecified severe protein-calorie malnutrition: Secondary | ICD-10-CM

## 2024-06-06 DIAGNOSIS — S72141D Displaced intertrochanteric fracture of right femur, subsequent encounter for closed fracture with routine healing: Secondary | ICD-10-CM | POA: Insufficient documentation

## 2024-06-06 DIAGNOSIS — K219 Gastro-esophageal reflux disease without esophagitis: Secondary | ICD-10-CM

## 2024-06-06 NOTE — Progress Notes (Signed)
 Location:  [facility] Room Number: 125 Place of Service:  SNF (31)   CODE STATUS: full   No Known Allergies  Chief Complaint  Patient presents with   Hospitalization Follow-up    HPI:  She is a 83 year old woman who has been hospitalized from 05-31-24 through 06-05-24. Her past medical history includes: hypothyroidism, hyperlipidemia, bilateral knee replacements. She had a mechanical fall and suffered a right moderately displaced comminuted intertochanteric fracture of proximal right femur.  Patient underwent right hip cephalomedullary nailing insertion for right hip fracture on 06-01-24. She is here for short term rehab with her goal to return back home. She does live by herself with family support. She will continue to be followed for her chronic illnesses including:    Acquired hypothyroidism:    GERD without esophagitis:  High cholesterol:   Past Medical History:  Diagnosis Date   Arthritis    Bruises easily    Cataracts, bilateral    Closed fracture of patella 03/06/2010   Qualifier: Diagnosis of   By: Margrette MD, [place] cholesterol    takes SImvastin  daily   History of bronchitis    last time about 30yrs ago   Hypothyroidism    takes Synthroid  daily   Joint pain    Joint swelling    PONV (postoperative nausea and vomiting)    Sciatica    Thyroid  disorder     Past Surgical History:  Procedure Laterality Date   ABDOMINAL HYSTERECTOMY     ANTERIOR CERVICAL DECOMP/DISCECTOMY FUSION  12/18/2011   Procedure: ANTERIOR CERVICAL DECOMPRESSION/DISCECTOMY FUSION 2 LEVELS;  Surgeon: Darina MALVA Boehringer, MD;  Location: [facility];  Service: Neurosurgery;  Laterality: N/A;  Anterior cervical decompression fusion four-six   CESAREAN SECTION     x 1    COLONOSCOPY     HAND SURGERY Left    INTRAMEDULLARY (IM) NAIL INTERTROCHANTERIC Left 02/20/2024   Procedure: FIXATION, FRACTURE, INTERTROCHANTERIC, WITH INTRAMEDULLARY ROD;  Surgeon: Elsa Lonni SAUNDERS, MD;  Location: [facility] OR;  Service: Orthopedics;  Laterality: Left;   INTRAMEDULLARY (IM) NAIL INTERTROCHANTERIC Right 06/01/2024   Procedure: FIXATION, FRACTURE, INTERTROCHANTERIC, WITH INTRAMEDULLARY ROD;  Surgeon: Sherida Adine BROCKS, MD;  Location: [facility] OR;  Service: Orthopedics;  Laterality: Right;   THYROIDECTOMY     TOTAL KNEE ARTHROPLASTY Right 04/18/2013   Procedure: TOTAL KNEE ARTHROPLASTY;  Surgeon: Maude KANDICE Herald, MD;  Location: [facility] OR;  Service: Orthopedics;  Laterality: Right;   TOTAL KNEE ARTHROPLASTY Left 07/25/2013   Procedure: TOTAL KNEE ARTHROPLASTY;  Surgeon: Maude KANDICE Herald, MD;  Location: [facility] OR;  Service: Orthopedics;  Laterality: Left;   VESICOVAGINAL FISTULA CLOSURE W/ TAH      Social History   Socioeconomic History   Marital status: Widowed    Spouse name: Not on file   Number of children: Not on file   Years of education: Not on file   Highest education level: Not on file  Occupational History   Occupation: packaging   Tobacco Use   Smoking status: Never   Smokeless tobacco: Never  Substance and Sexual Activity   Alcohol use: No   Drug use: No   Sexual activity: Not on file  Other Topics Concern   Not on file  Social History Narrative   Not on file   Social Drivers of Health   Financial Resource Strain: Not on file  Food Insecurity: No Food Insecurity (05/31/2024)   Hunger  Vital Sign    Worried About Programme Researcher, Broadcasting/film/video in the Last Year: Never true    Ran Out of Food in the Last Year: Never true  Transportation Needs: No Transportation Needs (05/31/2024)   PRAPARE - Administrator, Civil Service (Medical): No    Lack of Transportation (Non-Medical): No  Physical Activity: Not on file  Stress: Not on file  Social Connections: Moderately Integrated (05/31/2024)   Social Connection and Isolation Panel    Frequency of Communication with Friends and Family: More than three times a week    Frequency of Social Gatherings with Friends  and Family: More than three times a week    Attends Religious Services: More than 4 times per year    Active Member of [employer] or Organizations: Yes    Attends Banker Meetings: More than 4 times per year    Marital Status: Widowed  Intimate Partner Violence: Not At Risk (05/31/2024)   Humiliation, Afraid, Rape, and Kick questionnaire    Fear of Current or Ex-Partner: No    Emotionally Abused: No    Physically Abused: No    Sexually Abused: No   Family History  Problem Relation Age of Onset   Breast cancer Neg Hx       VITAL SIGNS BP 116/70   Pulse 90   Temp 98.2 F (36.8 C)   Resp 18   Ht 5' 5 (1.651 m)   Wt 157 lb 12.8 oz (71.6 kg)   SpO2 95%   BMI 26.26 kg/m   Outpatient Encounter Medications as of 06/06/2024  Medication Sig   Menthol -Methyl Salicylate  (QC MUSCLE RUB) 10-15 % CREA Apply 1 Application topically 2 (two) times daily. Right lower leg   Nutritional Supplements (PROSOURCE) LIQD Take 30 mLs by mouth 2 (two) times daily.   sennosides-docusate sodium  (SENOKOT-S) 8.6-50 MG tablet Take 1 tablet by mouth 2 (two) times daily.   aspirin  EC 81 MG tablet Take 1 tablet (81 mg total) by mouth 2 (two) times daily. Swallow whole.   calcium -vitamin D  (OSCAL WITH D) 500-5 MG-MCG tablet Take 2 tablets by mouth daily with lunch.   cholecalciferol  (VITAMIN D ) 1000 UNITS tablet Take 1,000 Units by mouth in the morning and at bedtime.   feeding supplement (ENSURE PLUS HIGH PROTEIN) LIQD Take 237 mLs by mouth 2 (two) times daily between meals.   HYDROcodone -acetaminophen  (NORCO/VICODIN) 5-325 MG tablet Take 1-2 tablets by mouth every 6 (six) hours as needed for moderate pain (pain score 4-6).   levothyroxine  (SYNTHROID ) 88 MCG tablet Take 88 mcg by mouth daily.   methocarbamol  (ROBAXIN ) 500 MG tablet Take 1 tablet (500 mg total) by mouth every 6 (six) hours as needed for up to 5 days for muscle spasms.   Multiple Vitamin (MULTIVITAMIN WITH MINERALS) TABS Take 1 tablet by  mouth daily.   pantoprazole  (PROTONIX ) 40 MG tablet Take 1 tablet (40 mg total) by mouth daily.   polyethylene glycol (MIRALAX  / GLYCOLAX ) 17 g packet Take 17 g by mouth daily as needed for mild constipation.   rosuvastatin  (CRESTOR ) 20 MG tablet Take 20 mg by mouth at bedtime.   vitamin E  180 MG (400 UNITS) capsule Take 1 capsule (400 Units total) by mouth daily.   No facility-administered encounter medications on file as of 06/06/2024.     SIGNIFICANT DIAGNOSTIC EXAMS  LABS REVIEWED:   05-31-24: wbc 10.5; hgb 13.9; hct 44.5; mcv 90.4 plt 265;glucose 121; bun 13; creat 0.77; k+ 4.3;  na++ 142; ca 10.4; gfr >60; protein 7.5; albumin  4.6 06-02-24: vitamin D  59.2 06-03-24: wbc 11.6; hct 10.6; hct 33.1; mcv 88.7 plt 185  Review of Systems  Constitutional:  Negative for malaise/fatigue.  Respiratory:  Negative for cough and shortness of breath.   Cardiovascular:  Negative for chest pain, palpitations and leg swelling.  Gastrointestinal:  Negative for abdominal pain, constipation and heartburn.  Musculoskeletal:  Negative for back pain, joint pain and myalgias.  Skin: Negative.   Neurological:  Negative for dizziness.  Psychiatric/Behavioral:  The patient is not nervous/anxious.     Physical Exam Constitutional:      General: She is not in acute distress.    Appearance: She is well-developed. She is not diaphoretic.  Neck:     Thyroid : No thyromegaly.  Cardiovascular:     Rate and Rhythm: Normal rate and regular rhythm.     Heart sounds: Normal heart sounds.  Pulmonary:     Effort: Pulmonary effort is normal. No respiratory distress.     Breath sounds: Normal breath sounds.  Abdominal:     General: Bowel sounds are normal. There is no distension.     Palpations: Abdomen is soft.     Tenderness: There is no abdominal tenderness.  Musculoskeletal:        General: Normal range of motion.     Cervical back: Neck supple.     Right lower leg: No edema.     Left lower leg: No edema.      Comments: Status post right hip IM nail  Lymphadenopathy:     Cervical: No cervical adenopathy.  Skin:    General: Skin is warm and dry.  Neurological:     Mental Status: She is alert and oriented to person, place, and time.  Psychiatric:        Mood and Affect: Mood normal.     ASSESSMENT/ PLAN:  TODAY  Closed intertrochanteric fracture of hip, right, with routine healing subsequent encounter: will continue therapy as directed will follow up with orthopedics. Will continue vicodin 5/325 mg every 6 hours as needed through 06-12-24. Will continue asa 81 mg twice daily through 07-17-24.   2. Acquired hypothyroidism: will continue synthroid  88 mcg daily   3. GERD without esophagitis: will continue protonix  40 mg daily   4. High cholesterol: will continue crestor  20 mg daily   5. Protein calorie malnutrition severe: will continue supplements as directed  6. Chronic constipation: will continue senna s twice daily; and miralax  daily as needed     Barnie Seip NP [facility]   call [phone]

## 2024-06-07 ENCOUNTER — Encounter: Payer: Self-pay | Admitting: Internal Medicine

## 2024-06-07 ENCOUNTER — Non-Acute Institutional Stay (SKILLED_NURSING_FACILITY): Admitting: Internal Medicine

## 2024-06-07 DIAGNOSIS — E890 Postprocedural hypothyroidism: Secondary | ICD-10-CM

## 2024-06-07 DIAGNOSIS — D62 Acute posthemorrhagic anemia: Secondary | ICD-10-CM | POA: Diagnosis not present

## 2024-06-07 DIAGNOSIS — S72141D Displaced intertrochanteric fracture of right femur, subsequent encounter for closed fracture with routine healing: Secondary | ICD-10-CM | POA: Diagnosis not present

## 2024-06-07 DIAGNOSIS — R251 Tremor, unspecified: Secondary | ICD-10-CM | POA: Diagnosis not present

## 2024-06-07 NOTE — Progress Notes (Signed)
 NURSING HOME LOCATION:  [facility] ROOM NUMBER:  125 P  CODE STATUS:  Full Code  PCP:  Norleen General MD  This is a comprehensive admission note to this SNFperformed on this date less than 30 days from date of admission. Included are preadmission medical/surgical history; reconciled medication list; family history; social history and comprehensive review of systems.  Corrections and additions to the records were documented. Comprehensive physical exam was also performed. Additionally a clinical summary was entered for each active diagnosis pertinent to this admission in the Problem List to enhance continuity of care.  HPI: She was hospitalized 12/3 - 06/05/2024 with closed right hip fracture sustained an apparent mechanical fall.  Right hip film revealed moderately displaced comminuted intertrochanteric fracture of the proximal right femur.  Right hip cephalomedullary nailing and insertion was completed. Post op she did have hoarseness & pill dysphagia for < 24 hours. Preop H/H was 13.9/44.5 and final postop H/H 10.6/33.1.  Indices were normochromic, normocytic.  Initial calcium  was 10.4 with subsequent value of 8.5.  The initial value appears to be an outlier and serially the calcium  is in the 8-9 range.  Vitamin D  was normal at 59.24.  Glucoses while hospitalized ranged from 121 up to 176.  Last A1c on record was 5.7% in 2008. 81 mg aspirin  twice a day x 6 weeks was prescribed for DVT prophylaxis. PT/OT recommended SNF for rehab.  Past medical and surgical history: Includes dyslipidemia; surgically related hypothyroidism; GERD; protein/caloric malnutrition, severe; and history of sciatica. Surgeries and procedures included history of bilateral knee replacements; abdominal hysterectomy; anterior cervical decompression and discectomy fusion; colonoscopy; intramedullary nailing intertrochanterically 02/20/2024 ; vesicovaginal fistula closure; and total thyroidectomy.. Family  history: reviewed, non contributory due to advanced age.  Social history: Nondrinker; non-smoker.   Review of systems: Staff reports she has been nonambulatory as of this time.  She requires maximum assistance with bed mobility and transfer; moderate assist for upper body ADLs; and is dependent for lower body ADLs.  She is WBAT to RLE. She denies any cardiac or neurologic prodrome prior to the fall. She states that she put down her phone ,turned and fell to the floor. She & her daughter validate that after being extubated her throat was sore and she had some pill dysphagia for 1 day.Her urine has been slightly brown; she denies any active GU symptoms.  Constitutional: No fever, significant weight change, fatigue  Eyes: No redness, discharge, pain, vision change ENT/mouth: No nasal congestion, purulent discharge, earache, change in hearing  Cardiovascular: No chest pain, palpitations, paroxysmal nocturnal dyspnea, claudication, edema  Respiratory: No cough, sputum production, hemoptysis, DOE, significant snoring, apnea  Gastrointestinal: No heartburn, abdominal pain, nausea /vomiting, rectal bleeding, melena, change in bowels Genitourinary: No dysuria, hematuria, pyuria, incontinence, nocturia Dermatologic: No rash, pruritus, change in appearance of skin Neurologic: No dizziness, headache, syncope, seizures, numbness, tingling Psychiatric: No significant anxiety, depression, insomnia, anorexia Endocrine: No change in hair/skin/nails, excessive thirst, excessive hunger, excessive urination  Hematologic/lymphatic: No significant bruising, lymphadenopathy, abnormal bleeding Allergy/immunology: No itchy/watery eyes, significant sneezing, urticaria, angioedema  Physical exam:  Pertinent or positive findings: She is slightly hoarse.  She has an upper plate. Hearing aids present. Grade 1/2 systolic murmur is present.  There is slight tachycardia.  There is trace edema at the sock line.  Pedal pulses  are palpable. IOW present.  She exhibits a fine tremor greater in the right hand than the left.  General appearance: no acute distress, increased work  of breathing is present.   Lymphatic: No lymphadenopathy about the head, neck, axilla. Eyes: No conjunctival inflammation or lid edema is present. There is no scleral icterus. Ears:  External ear exam shows no significant lesions or deformities.   Nose:  External nasal examination shows no deformity or inflammation. Nasal mucosa are pink and moist without lesions, exudates Neck:  No thyromegaly, masses, tenderness noted.    Heart:  No gallop,  click, rub.  Lungs: Chest clear to auscultation without wheezes, rhonchi, rales, rubs. Abdomen: Bowel sounds are normal.  Abdomen is soft and nontender with no organomegaly, hernias, masses. GU: Deferred  Extremities:  No cyanosis, clubbing Neurologic exam: Balance, Rhomberg, finger to nose testing could not be completed due to clinical state Skin: Warm & dry w/o tenting. No significant lesions or rash.  See clinical summary under each active problem in the Problem List with associated updated therapeutic plan : Acute blood loss anemia (ABLA) 12/3 - 06/05/2024 preop H/H 13.9/44.5; post right hip cephalomedullary nailing and insertion for right hip fracture H/H 10.6/33.1.  She is on 81 mg twice daily x 6 weeks as DVT prophylaxis.  Monitor for any bleeding dyscrasias.  Closed intertrochanteric fracture of hip, right, with routine healing, subsequent encounter PT/OT at SNF as tolerated.  With recurrent falls & fracture w/o definitive etiology; PCP may wish to consider event recorder.  Hypothyroidism Slight tachycardia & BUE tremor will be monitored.Dr Marvine monitoring TSH according to her & daughter.Currently on 88 mcg L-thyroxine. No TSH on recoerd in Epic. I discussed therapeutic TSH levels with her & daughter. If tachycardia persistent &/or tremor progressive, update TSH to assess efficacy of current  L-thyroxine dose.  Tremor of both hands I recommended they discuss referral to Dr Tat with PCP.

## 2024-06-07 NOTE — Assessment & Plan Note (Addendum)
 PT/OT at SNF as tolerated.  With recurrent falls & fracture w/o definitive etiology; PCP may wish to consider event recorder.

## 2024-06-07 NOTE — Patient Instructions (Signed)
 See assessment and plan under each diagnosis in the problem list and acutely for this visit

## 2024-06-07 NOTE — Assessment & Plan Note (Signed)
 12/3 - 06/05/2024 preop H/H 13.9/44.5; post right hip cephalomedullary nailing and insertion for right hip fracture H/H 10.6/33.1.  She is on 81 mg twice daily x 6 weeks as DVT prophylaxis.  Monitor for any bleeding dyscrasias.

## 2024-06-07 NOTE — Assessment & Plan Note (Addendum)
 Slight tachycardia & BUE tremor will be monitored.Dr Marvine monitoring TSH according to her & daughter.Currently on 88 mcg L-thyroxine. No TSH on recoerd in Epic. I discussed therapeutic TSH levels with her & daughter. If tachycardia persistent &/or tremor progressive, update TSH to assess efficacy of current L-thyroxine dose.

## 2024-06-08 DIAGNOSIS — R251 Tremor, unspecified: Secondary | ICD-10-CM | POA: Insufficient documentation

## 2024-06-08 NOTE — Assessment & Plan Note (Signed)
 I recommended they discuss referral to Dr Tat with PCP.

## 2024-06-20 ENCOUNTER — Encounter: Payer: Self-pay | Admitting: Adult Health

## 2024-06-20 DIAGNOSIS — E890 Postprocedural hypothyroidism: Secondary | ICD-10-CM

## 2024-06-20 DIAGNOSIS — E440 Moderate protein-calorie malnutrition: Secondary | ICD-10-CM

## 2024-06-20 DIAGNOSIS — S72141D Displaced intertrochanteric fracture of right femur, subsequent encounter for closed fracture with routine healing: Secondary | ICD-10-CM

## 2024-06-20 NOTE — Progress Notes (Signed)
 " Location:  [facility] Room Number: 125 Place of Service:  SNF (31)   CODE STATUS: full   Allergies[1]  Chief Complaint  Patient presents with   Acute Visit    Care plan meeting.     HPI:  We have come together for her care plan meeting. Donna Daniels 14/15 mood 2/30: little energy. Using wheelchair without falls. She requires moderate to maximum assist with her adl care. She is frequently incontinent of bladder and bowel. Dietary: setup for mead; regular diet appetite 51-100%. Weigh is 142.4 pounds. Has supplements twice daily. Therapy: . She will continue to be followed for her chronic illnesses including: Postoperative hypothyroidism    Closed intertrochanteric of hip, right, with routine healing subsequent encounter   Moderate protein calorie malnutrition  Past Medical History:  Diagnosis Date   Arthritis    Bruises easily    Cataracts, bilateral    Closed fracture of patella 03/06/2010   Qualifier: Diagnosis of   By: Margrette MD, Taft         High cholesterol    takes SImvastin  daily   History of bronchitis    last time about 75yrs ago   Hypothyroidism    takes Synthroid  daily   Joint pain    Joint swelling    PONV (postoperative nausea and vomiting)    Sciatica    Thyroid  disorder     Past Surgical History:  Procedure Laterality Date   ABDOMINAL HYSTERECTOMY     ANTERIOR CERVICAL DECOMP/DISCECTOMY FUSION  12/18/2011   Procedure: ANTERIOR CERVICAL DECOMPRESSION/DISCECTOMY FUSION 2 LEVELS;  Surgeon: Darina MALVA Boehringer, MD;  Location: [facility];  Service: Neurosurgery;  Laterality: N/A;  Anterior cervical decompression fusion four-six   CESAREAN SECTION     x 1    COLONOSCOPY     HAND SURGERY Left    INTRAMEDULLARY (IM) NAIL INTERTROCHANTERIC Left 02/20/2024   Procedure: FIXATION, FRACTURE, INTERTROCHANTERIC, WITH INTRAMEDULLARY ROD;  Surgeon: Elsa Lonni SAUNDERS, MD;  Location: [facility] OR;  Service: Orthopedics;  Laterality: Left;   INTRAMEDULLARY  (IM) NAIL INTERTROCHANTERIC Right 06/01/2024   Procedure: FIXATION, FRACTURE, INTERTROCHANTERIC, WITH INTRAMEDULLARY ROD;  Surgeon: Sherida Adine BROCKS, MD;  Location: [facility] OR;  Service: Orthopedics;  Laterality: Right;   THYROIDECTOMY     TOTAL KNEE ARTHROPLASTY Right 04/18/2013   Procedure: TOTAL KNEE ARTHROPLASTY;  Surgeon: Maude KANDICE Herald, MD;  Location: [facility] OR;  Service: Orthopedics;  Laterality: Right;   TOTAL KNEE ARTHROPLASTY Left 07/25/2013   Procedure: TOTAL KNEE ARTHROPLASTY;  Surgeon: Maude KANDICE Herald, MD;  Location: [facility] OR;  Service: Orthopedics;  Laterality: Left;   VESICOVAGINAL FISTULA CLOSURE W/ TAH      Social History   Socioeconomic History   Marital status: Widowed    Spouse name: Not on file   Number of children: Not on file   Years of education: Not on file   Highest education level: Not on file  Occupational History   Occupation: packaging   Tobacco Use   Smoking status: Never   Smokeless tobacco: Never  Substance and Sexual Activity   Alcohol use: No   Drug use: No   Sexual activity: Not on file  Other Topics Concern   Not on file  Social History Narrative   Not on file   Social Drivers of Health   Tobacco Use: Low Risk (06/20/2024)   Patient History    Smoking Tobacco Use: Never    Smokeless Tobacco Use: Never    Passive  Exposure: Not on file  Financial Resource Strain: Not on file  Food Insecurity: No Food Insecurity (05/31/2024)   Epic    Worried About Programme Researcher, Broadcasting/film/video in the Last Year: Never true    Ran Out of Food in the Last Year: Never true  Transportation Needs: No Transportation Needs (05/31/2024)   Epic    Lack of Transportation (Medical): No    Lack of Transportation (Non-Medical): No  Physical Activity: Not on file  Stress: Not on file  Social Connections: Moderately Integrated (05/31/2024)   Social Connection and Isolation Panel    Frequency of Communication with Friends and Family: More than three times a week    Frequency of Social  Gatherings with Friends and Family: More than three times a week    Attends Religious Services: More than 4 times per year    Active Member of [employer] or Organizations: Yes    Attends Banker Meetings: More than 4 times per year    Marital Status: Widowed  Intimate Partner Violence: Not At Risk (05/31/2024)   Epic    Fear of Current or Ex-Partner: No    Emotionally Abused: No    Physically Abused: No    Sexually Abused: No  Depression (PHQ2-9): Not on file  Alcohol Screen: Not on file  Housing: Low Risk (05/31/2024)   Epic    Unable to Pay for Housing in the Last Year: No    Number of Times Moved in the Last Year: 0    Homeless in the Last Year: No  Utilities: Not At Risk (05/31/2024)   Epic    Threatened with loss of utilities: No  Health Literacy: Not on file   Family History  Problem Relation Age of Onset   Breast cancer Neg Hx       VITAL SIGNS BP 118/68   Pulse 74   Temp 98.6 F (37 C)   Resp 18   Ht 5' 6 (1.676 m)   Wt 142 lb (64.4 kg)   SpO2 97%   BMI 22.92 kg/m   Outpatient Encounter Medications as of 06/20/2024  Medication Sig   aspirin  EC 81 MG tablet Take 1 tablet (81 mg total) by mouth 2 (two) times daily. Swallow whole.   calcium -vitamin D  (OSCAL WITH D) 500-5 MG-MCG tablet Take 2 tablets by mouth daily with lunch.   cholecalciferol  (VITAMIN D ) 1000 UNITS tablet Take 1,000 Units by mouth in the morning and at bedtime.   feeding supplement (ENSURE PLUS HIGH PROTEIN) LIQD Take 237 mLs by mouth 2 (two) times daily between meals.   HYDROcodone -acetaminophen  (NORCO/VICODIN) 5-325 MG tablet Take 1-2 tablets by mouth every 6 (six) hours as needed for moderate pain (pain score 4-6).   levothyroxine  (SYNTHROID ) 88 MCG tablet Take 88 mcg by mouth daily.   Menthol -Methyl Salicylate  (QC MUSCLE RUB) 10-15 % CREA Apply 1 Application topically 2 (two) times daily. Right lower leg   Multiple Vitamin (MULTIVITAMIN WITH MINERALS) TABS Take 1 tablet by mouth  daily.   Nutritional Supplements (PROSOURCE) LIQD Take 30 mLs by mouth 2 (two) times daily.   pantoprazole  (PROTONIX ) 40 MG tablet Take 1 tablet (40 mg total) by mouth daily.   polyethylene glycol (MIRALAX  / GLYCOLAX ) 17 g packet Take 17 g by mouth daily as needed for mild constipation.   rosuvastatin  (CRESTOR ) 20 MG tablet Take 20 mg by mouth at bedtime.   sennosides-docusate sodium  (SENOKOT-S) 8.6-50 MG tablet Take 1 tablet by mouth 2 (two) times daily.  vitamin E  180 MG (400 UNITS) capsule Take 1 capsule (400 Units total) by mouth daily.   No facility-administered encounter medications on file as of 06/20/2024.     SIGNIFICANT DIAGNOSTIC EXAMS  LABS REVIEWED:   05-31-24: wbc 10.5; hgb 13.9; hct 44.5; mcv 90.4 plt 265;glucose 121; bun 13; creat 0.77; k+ 4.3; na++ 142; ca 10.4; gfr >60; protein 7.5; albumin  4.6 06-02-24: vitamin D  59.2 06-03-24: wbc 11.6; hgb 10.6; hct 33.1; mcv 88.7 plt 185  Review of Systems  Constitutional:  Negative for malaise/fatigue.  Respiratory:  Negative for cough and shortness of breath.   Cardiovascular:  Negative for chest pain, palpitations and leg swelling.  Gastrointestinal:  Negative for abdominal pain, constipation and heartburn.  Musculoskeletal:  Negative for back pain, joint pain and myalgias.  Skin: Negative.   Neurological:  Negative for dizziness.  Psychiatric/Behavioral:  The patient is not nervous/anxious.     Physical Exam Constitutional:      General: She is not in acute distress.    Appearance: She is well-developed. She is not diaphoretic.  Neck:     Thyroid : No thyromegaly.  Cardiovascular:     Rate and Rhythm: Normal rate and regular rhythm.     Heart sounds: Normal heart sounds.  Pulmonary:     Effort: Pulmonary effort is normal. No respiratory distress.     Breath sounds: Normal breath sounds.  Abdominal:     General: Bowel sounds are normal. There is no distension.     Palpations: Abdomen is soft.     Tenderness: There  is no abdominal tenderness.  Musculoskeletal:        General: Normal range of motion.     Cervical back: Neck supple.     Right lower leg: No edema.     Left lower leg: No edema.     Comments: Status post right hip IM nail   Lymphadenopathy:     Cervical: No cervical adenopathy.  Skin:    General: Skin is warm and dry.  Neurological:     Mental Status: She is alert and oriented to person, place, and time. Mental status is at baseline.  Psychiatric:        Mood and Affect: Mood normal.     ASSESSMENT/ PLAN:  TODAY  Postoperative hypothyroidism Closed intertrochanteric of hip, right, with routine healing subsequent encounter Moderate protein calorie malnutrition  Will continue current medications Will continue therapy as directed Will continue to monitor her status Goals of care:   Time spent with patient: 40 minutes: therapy; medications; dietary.    Barnie Seip NP [facility]  call [phone]   This encounter was created in error - please disregard.     [1] No Known Allergies  "

## 2024-06-27 ENCOUNTER — Other Ambulatory Visit: Payer: Self-pay | Admitting: Adult Health

## 2024-06-27 ENCOUNTER — Encounter: Payer: Self-pay | Admitting: Adult Health

## 2024-06-27 ENCOUNTER — Non-Acute Institutional Stay (SKILLED_NURSING_FACILITY): Admitting: Adult Health

## 2024-06-27 DIAGNOSIS — R4189 Other symptoms and signs involving cognitive functions and awareness: Secondary | ICD-10-CM

## 2024-06-27 DIAGNOSIS — R29818 Other symptoms and signs involving the nervous system: Secondary | ICD-10-CM | POA: Diagnosis not present

## 2024-06-27 DIAGNOSIS — E43 Unspecified severe protein-calorie malnutrition: Secondary | ICD-10-CM | POA: Diagnosis not present

## 2024-06-27 DIAGNOSIS — D62 Acute posthemorrhagic anemia: Secondary | ICD-10-CM

## 2024-06-27 MED ORDER — PANTOPRAZOLE SODIUM 40 MG PO TBEC: 40.0000 mg | DELAYED_RELEASE_TABLET | Freq: Every day | ORAL | 0 refills | Status: AC

## 2024-06-27 MED ORDER — OSELTAMIVIR PHOSPHATE 30 MG PO CAPS: 30.0000 mg | ORAL_CAPSULE | Freq: Every day | ORAL | 0 refills | Status: AC

## 2024-06-27 MED ORDER — LEVOTHYROXINE SODIUM 88 MCG PO TABS: 88.0000 ug | ORAL_TABLET | Freq: Every day | ORAL | 0 refills | Status: AC

## 2024-06-27 MED ORDER — PROSOURCE PO LIQD: 30.0000 mL | Freq: Two times a day (BID) | ORAL | 0 refills | Status: AC

## 2024-06-27 MED ORDER — ROSUVASTATIN CALCIUM 20 MG PO TABS: 20.0000 mg | ORAL_TABLET | Freq: Every day | ORAL | 0 refills | Status: AC

## 2024-06-27 NOTE — Progress Notes (Unsigned)
 " Location:  [facility] Room Number: North/125/P Place of Service:  SNF (31)   CODE STATUS: DNR  Allergies[1]  Chief Complaint  Patient presents with   Discharge Note    HPI:  She is being discharged to home with home health for pt/ot. She will not need any dme. She will need her prescriptions written and will need to follow up with her medical provider. She had been hospitalized as she suffered a right moderately displaced comminuted intertochanteric fracture of proximal right femur.  Patient underwent right hip cephalomedullary nailing insertion for right hip fracture on 06-01-24. She was admitted to this facility for short term rehab: therapy: ambulate 120 feet with rolling walker and contact guard. Upper body supervision; lower body min assist. Brp: contact guard.   Past Medical History:  Diagnosis Date   Arthritis    Bruises easily    Cataracts, bilateral    Closed fracture of patella 03/06/2010   Qualifier: Diagnosis of   By: Margrette MD, [place] cholesterol    takes SImvastin  daily   History of bronchitis    last time about 41yrs ago   Hypothyroidism    takes Synthroid  daily   Joint pain    Joint swelling    PONV (postoperative nausea and vomiting)    Sciatica    Thyroid  disorder     Past Surgical History:  Procedure Laterality Date   ABDOMINAL HYSTERECTOMY     ANTERIOR CERVICAL DECOMP/DISCECTOMY FUSION  12/18/2011   Procedure: ANTERIOR CERVICAL DECOMPRESSION/DISCECTOMY FUSION 2 LEVELS;  Surgeon: Darina MALVA Boehringer, MD;  Location: [facility];  Service: Neurosurgery;  Laterality: N/A;  Anterior cervical decompression fusion four-six   CESAREAN SECTION     x 1    COLONOSCOPY     HAND SURGERY Left    INTRAMEDULLARY (IM) NAIL INTERTROCHANTERIC Left 02/20/2024   Procedure: FIXATION, FRACTURE, INTERTROCHANTERIC, WITH INTRAMEDULLARY ROD;  Surgeon: Elsa Lonni SAUNDERS, MD;  Location: [facility] OR;  Service: Orthopedics;  Laterality: Left;    INTRAMEDULLARY (IM) NAIL INTERTROCHANTERIC Right 06/01/2024   Procedure: FIXATION, FRACTURE, INTERTROCHANTERIC, WITH INTRAMEDULLARY ROD;  Surgeon: Sherida Adine BROCKS, MD;  Location: [facility] OR;  Service: Orthopedics;  Laterality: Right;   THYROIDECTOMY     TOTAL KNEE ARTHROPLASTY Right 04/18/2013   Procedure: TOTAL KNEE ARTHROPLASTY;  Surgeon: Maude KANDICE Herald, MD;  Location: [facility] OR;  Service: Orthopedics;  Laterality: Right;   TOTAL KNEE ARTHROPLASTY Left 07/25/2013   Procedure: TOTAL KNEE ARTHROPLASTY;  Surgeon: Maude KANDICE Herald, MD;  Location: [facility] OR;  Service: Orthopedics;  Laterality: Left;   VESICOVAGINAL FISTULA CLOSURE W/ TAH      Social History   Socioeconomic History   Marital status: Widowed    Spouse name: Not on file   Number of children: Not on file   Years of education: Not on file   Highest education level: Not on file  Occupational History   Occupation: packaging   Tobacco Use   Smoking status: Never   Smokeless tobacco: Never  Substance and Sexual Activity   Alcohol use: No   Drug use: No   Sexual activity: Not on file  Other Topics Concern   Not on file  Social History Narrative   Not on file   Social Drivers of Health   Tobacco Use: Low Risk (06/27/2024)   Patient History    Smoking Tobacco Use: Never    Smokeless Tobacco Use: Never    Passive  Exposure: Not on file  Financial Resource Strain: Not on file  Food Insecurity: No Food Insecurity (05/31/2024)   Epic    Worried About Programme Researcher, Broadcasting/film/video in the Last Year: Never true    Ran Out of Food in the Last Year: Never true  Transportation Needs: No Transportation Needs (05/31/2024)   Epic    Lack of Transportation (Medical): No    Lack of Transportation (Non-Medical): No  Physical Activity: Not on file  Stress: Not on file  Social Connections: Moderately Integrated (05/31/2024)   Social Connection and Isolation Panel    Frequency of Communication with Friends and Family: More than three times a week     Frequency of Social Gatherings with Friends and Family: More than three times a week    Attends Religious Services: More than 4 times per year    Active Member of [employer] or Organizations: Yes    Attends Banker Meetings: More than 4 times per year    Marital Status: Widowed  Intimate Partner Violence: Not At Risk (05/31/2024)   Epic    Fear of Current or Ex-Partner: No    Emotionally Abused: No    Physically Abused: No    Sexually Abused: No  Depression (PHQ2-9): Not on file  Alcohol Screen: Not on file  Housing: Low Risk (05/31/2024)   Epic    Unable to Pay for Housing in the Last Year: No    Number of Times Moved in the Last Year: 0    Homeless in the Last Year: No  Utilities: Not At Risk (05/31/2024)   Epic    Threatened with loss of utilities: No  Health Literacy: Not on file   Family History  Problem Relation Age of Onset   Breast cancer Neg Hx       VITAL SIGNS BP 106/66   Pulse 83   Temp 98.4 F (36.9 C)   Resp 18   Ht 5' 6 (1.676 m)   Wt 142 lb 6.4 oz (64.6 kg)   SpO2 99%   BMI 22.98 kg/m   Outpatient Encounter Medications as of 06/27/2024  Medication Sig   aspirin  81 MG chewable tablet Chew 81 mg by mouth daily.   cholecalciferol  (VITAMIN D ) 1000 UNITS tablet Take 1,000 Units by mouth in the morning and at bedtime.   Menthol -Methyl Salicylate  (QC MUSCLE RUB) 10-15 % CREA Apply 1 Application topically 2 (two) times daily. Right lower leg   Multiple Vitamin (MULTIVITAMIN WITH MINERALS) TABS Take 1 tablet by mouth daily.   Nutritional Supplements (PROSOURCE) LIQD Take 30 mLs by mouth 2 (two) times daily.   [START ON 06/28/2024] oseltamivir (TAMIFLU) 30 MG capsule Take 1 capsule (30 mg total) by mouth daily for 10 days.   pantoprazole  (PROTONIX ) 40 MG tablet Take 1 tablet (40 mg total) by mouth daily.   polyethylene glycol (MIRALAX  / GLYCOLAX ) 17 g packet Take 17 g by mouth daily as needed for mild constipation.   rosuvastatin  (CRESTOR ) 20 MG tablet  Take 1 tablet (20 mg total) by mouth at bedtime.   vitamin E  180 MG (400 UNITS) capsule Take 1 capsule (400 Units total) by mouth daily.   calcium -vitamin D  (OSCAL WITH D) 500-5 MG-MCG tablet Take 2 tablets by mouth daily with lunch. (Patient not taking: Reported on 06/27/2024)   feeding supplement (ENSURE PLUS HIGH PROTEIN) LIQD Take 237 mLs by mouth 2 (two) times daily between meals. (Patient not taking: Reported on 06/27/2024)   levothyroxine  (SYNTHROID ) 88  MCG tablet Take 1 tablet (88 mcg total) by mouth daily. (Patient not taking: Reported on 06/27/2024)   sennosides-docusate sodium  (SENOKOT-S) 8.6-50 MG tablet Take 1 tablet by mouth 2 (two) times daily. (Patient not taking: Reported on 06/27/2024)   [DISCONTINUED] aspirin  EC 81 MG tablet Take 1 tablet (81 mg total) by mouth 2 (two) times daily. Swallow whole.   No facility-administered encounter medications on file as of 06/27/2024.     SIGNIFICANT DIAGNOSTIC EXAMS  LABS REVIEWED:   05-31-24: wbc 10.5; hgb 13.9; hct 44.5; mcv 90.4 plt 265;glucose 121; bun 13; creat 0.77; k+ 4.3; na++ 142; ca 10.4; gfr >60; protein 7.5; albumin  4.6 06-02-24: vitamin D  59.2 06-03-24: wbc 11.6; hct 10.6; hct 33.1; mcv 88.7 plt 185  Review of Systems  Constitutional:  Negative for malaise/fatigue.  Respiratory:  Negative for cough and shortness of breath.   Cardiovascular:  Negative for chest pain, palpitations and leg swelling.  Gastrointestinal:  Negative for abdominal pain, constipation and heartburn.  Musculoskeletal:  Negative for back pain, joint pain and myalgias.  Skin: Negative.   Neurological:  Negative for dizziness.  Psychiatric/Behavioral:  The patient is not nervous/anxious.     Physical Exam Constitutional:      General: She is not in acute distress.    Appearance: She is well-developed. She is not diaphoretic.  Neck:     Thyroid : No thyromegaly.  Cardiovascular:     Rate and Rhythm: Normal rate and regular rhythm.     Heart  sounds: Normal heart sounds.  Pulmonary:     Effort: Pulmonary effort is normal. No respiratory distress.     Breath sounds: Normal breath sounds.  Abdominal:     General: Bowel sounds are normal. There is no distension.     Palpations: Abdomen is soft.     Tenderness: There is no abdominal tenderness.  Musculoskeletal:        General: Normal range of motion.     Cervical back: Neck supple.     Right lower leg: No edema.     Left lower leg: No edema.  Lymphadenopathy:     Cervical: No cervical adenopathy.  Skin:    General: Skin is warm and dry.  Neurological:     Mental Status: She is alert and oriented to person, place, and time.  Psychiatric:        Mood and Affect: Mood normal.       ASSESSMENT/ PLAN:   Patient is being discharged with the following home health services:  pt/ot to evaluate and treat as indicated for gait balance strength adl training.   Patient is being discharged with the following durable medical equipment:  none needed   Patient has been advised to f/u with their PCP in 1-2 weeks to for a transitions of care visit.  Social services at their facility was responsible for arranging this appointment.  Pt was provided with adequate prescriptions of noncontrolled medications to reach the scheduled appointment .  For controlled substances, a limited supply was provided as appropriate for the individual patient.  If the pt normally receives these medications from a pain clinic or has a contract with another physician, these medications should be received from that clinic or physician only).    Her prescriptions have been sent to [facility]   Time spent with patient: 35 minutes: medications; home health; dme.   Barnie Seip NP [facility]  call [phone]       [1] No Known Allergies  "

## 2024-06-29 ENCOUNTER — Other Ambulatory Visit: Payer: Self-pay | Admitting: Adult Health

## 2024-07-06 ENCOUNTER — Ambulatory Visit: Payer: Self-pay | Admitting: Family Medicine

## 2024-10-04 ENCOUNTER — Ambulatory Visit: Payer: Self-pay
